# Patient Record
Sex: Male | Born: 1970
Health system: Southern US, Community
[De-identification: ages and names within clinical notes are randomized; demographics above are authoritative.]

## PROBLEM LIST (undated history)

## (undated) ENCOUNTER — Emergency Department (HOSPITAL_COMMUNITY): Admission: EM | Payer: Self-pay | Source: Home / Self Care

## (undated) DIAGNOSIS — W3400XA Accidental discharge from unspecified firearms or gun, initial encounter: Secondary | ICD-10-CM

## (undated) DIAGNOSIS — F1096 Alcohol use, unspecified with alcohol-induced persisting amnestic disorder: Secondary | ICD-10-CM

## (undated) DIAGNOSIS — I10 Essential (primary) hypertension: Secondary | ICD-10-CM

## (undated) DIAGNOSIS — Z9889 Other specified postprocedural states: Secondary | ICD-10-CM

## (undated) HISTORY — PX: OTHER SURGICAL HISTORY: SHX169

---

## 2006-05-02 ENCOUNTER — Encounter: Admission: RE | Admit: 2006-05-02 | Discharge: 2006-05-02 | Payer: Self-pay | Admitting: Family Medicine

## 2010-02-02 ENCOUNTER — Emergency Department (HOSPITAL_COMMUNITY): Admission: EM | Admit: 2010-02-02 | Discharge: 2010-02-02 | Payer: Self-pay | Admitting: Emergency Medicine

## 2010-02-08 ENCOUNTER — Emergency Department (HOSPITAL_COMMUNITY): Admission: EM | Admit: 2010-02-08 | Discharge: 2010-02-08 | Payer: Self-pay | Admitting: Family Medicine

## 2018-01-29 ENCOUNTER — Emergency Department (HOSPITAL_COMMUNITY)
Admission: EM | Admit: 2018-01-29 | Discharge: 2018-01-29 | Disposition: A | Discharge status: Home / Self Care | Payer: Self-pay | Attending: Emergency Medicine | Admitting: Emergency Medicine

## 2018-01-29 ENCOUNTER — Other Ambulatory Visit: Payer: Self-pay

## 2018-01-29 ENCOUNTER — Emergency Department (HOSPITAL_COMMUNITY): Payer: Self-pay

## 2018-01-29 ENCOUNTER — Encounter (HOSPITAL_COMMUNITY): Payer: Self-pay

## 2018-01-29 DIAGNOSIS — F17220 Nicotine dependence, chewing tobacco, uncomplicated: Secondary | ICD-10-CM | POA: Insufficient documentation

## 2018-01-29 DIAGNOSIS — S60352A Superficial foreign body of left thumb, initial encounter: Secondary | ICD-10-CM | POA: Insufficient documentation

## 2018-01-29 DIAGNOSIS — Y929 Unspecified place or not applicable: Secondary | ICD-10-CM | POA: Insufficient documentation

## 2018-01-29 DIAGNOSIS — Y9389 Activity, other specified: Secondary | ICD-10-CM | POA: Insufficient documentation

## 2018-01-29 DIAGNOSIS — Z23 Encounter for immunization: Secondary | ICD-10-CM | POA: Insufficient documentation

## 2018-01-29 DIAGNOSIS — Y998 Other external cause status: Secondary | ICD-10-CM | POA: Insufficient documentation

## 2018-01-29 DIAGNOSIS — W450XXA Nail entering through skin, initial encounter: Secondary | ICD-10-CM | POA: Insufficient documentation

## 2018-01-29 MED ORDER — TETANUS-DIPHTH-ACELL PERTUSSIS 5-2.5-18.5 LF-MCG/0.5 IM SUSP
0.5000 mL | Freq: Once | INTRAMUSCULAR | Status: AC
  Administered 2018-01-29: 0.5 mL via INTRAMUSCULAR
  Filled 2018-01-29: qty 0.5

## 2018-01-29 MED ORDER — LIDOCAINE HCL 2 % IJ SOLN
20.0000 mL | Freq: Once | INTRAMUSCULAR | Status: AC
  Administered 2018-01-29: 400 mg
  Filled 2018-01-29: qty 20

## 2018-01-29 NOTE — ED Notes (Signed)
PA at bedside.

## 2018-01-29 NOTE — Discharge Instructions (Signed)
You were seen in the emergency department for a foreign body embedded in your left thumb.  This was successfully removed.  You declined post removal x-ray however I do believe that the entire foreign body was successfully removed.  There is no indication for antibiotics at this time.  Keep the wound clean with clean water and soap, you may apply thin layer of antibiotic ointment to prevent infection.  Monitor for signs of infection including increased pain, redness, warmth, pus, pain with movement of the joints, fevers.

## 2018-01-29 NOTE — ED Provider Notes (Addendum)
[facility] EMERGENCY DEPARTMENT Provider Note   CSN: [id number] Arrival date & time: 01/29/18  1547     History   Chief Complaint No chief complaint on file.   HPI Dylan Snyder is a 47 y.o. male here for evaluation of foreign body in left thumb.  Onset a couple hours before arrival.  States he was using a nail gun and accidentally shot himself in the thumb.  He washed it with iodine and made a tiny incision to try to get it out himself but due to pain could not.  States that he is had this happen to him multiple times in the past and usually can remove them himself.  He denies distal tingling or numbness.  He has minimal pain.  No other associated symptoms.  Tetanus is not up-to-date.  States he was not wearing a glove, his hands were moderately dirty but there was no soil on his hands.  No h/o diabetes or other medical issues.   HPI  History reviewed. No pertinent past medical history.  There are no active problems to display for this patient.   History reviewed. No pertinent surgical history.      Home Medications    Prior to Admission medications   Not on File    Family History History reviewed. No pertinent family history.  Social History Social History   Tobacco Use  . Smoking status: Former Games developer  . Smokeless tobacco: Current User    Types: Snuff  Substance Use Topics  . Alcohol use: Not on file  . Drug use: Not on file     Allergies   Patient has no known allergies.   Review of Systems Review of Systems  Skin: Positive for wound.  All other systems reviewed and are negative.    Physical Exam Updated Vital Signs BP (!) 166/107 (BP Location: Right Arm)   Pulse 88   Temp 98.3 F (36.8 C) (Oral)   Resp 16   Ht 6\' 2"  (1.88 m)   Wt 96.6 kg (213 lb)   SpO2 100%   BMI 27.35 kg/m   Physical Exam  Constitutional: He is oriented to person, place, and time. He appears well-developed and well-nourished.  Non-toxic appearance.    HENT:  Head: Normocephalic.  Right Ear: External ear normal.  Left Ear: External ear normal.  Nose: Nose normal.  Eyes: Conjunctivae and EOM are normal.  Neck: Full passive range of motion without pain.  Cardiovascular: Normal rate.  Good cap refill to the distal left thumb.  Pulmonary/Chest: Effort normal. No tachypnea. No respiratory distress.  Musculoskeletal: Normal range of motion.  No focal bony tenderness to the joints of the left thumb.  Slightly decreased range of motion at the IP joint.  Neurological: He is alert and oriented to person, place, and time.  Sensation to light touch intact to the tip of the left thumb.  Skin: Skin is warm and dry. Capillary refill takes less than 2 seconds.  2 small abrasions to the palmar aspect of the left thumb across the IP joint, or patient used scissors to make incisions and attempts to remove the nail.  Minimal tenderness to this area.  No surrounding erythema, edema, warmth, drainage, debris.  Psychiatric: His behavior is normal. Thought content normal.     ED Treatments / Results  Labs (all labs ordered are listed, but only abnormal results are displayed) Labs Reviewed - No data to display  EKG None  Radiology Dg Finger Thumb  Left  Result Date: 01/29/2018 CLINICAL DATA:  Pin nail in thumb. EXAM: LEFT THUMB 2+V COMPARISON:  None. FINDINGS: Thin nail within the volar and ulnar soft tissues of the thumb, with the distal tip of the nail approximately 2 mm from the volar skin surface and 3 mm from the ulnar skin surface. No acute fracture or dislocation. Joint spaces are preserved. Bone mineralization is normal. IMPRESSION: Nail within the volar and ulnar soft tissues of the thumb as described above. No acute osseous abnormality. Electronically Signed   By: Obie Dredge M.D.   On: 01/29/2018 16:33    Procedures .Foreign Body Removal Date/Time: 02/13/2018 9:23 AM Performed by: Liberty Handy, PA-C Authorized by: Liberty Handy, PA-C  Consent: Verbal consent obtained. Written consent not obtained. Risks and benefits: risks, benefits and alternatives were discussed Consent given by: patient Patient understanding: patient states understanding of the procedure being performed Patient consent: the patient's understanding of the procedure matches consent given Relevant documents: relevant documents present and verified Imaging studies: imaging studies available Required items: required blood products, implants, devices, and special equipment available Patient identity confirmed: arm band Time out: Immediately prior to procedure a "time out" was called to verify the correct patient, procedure, equipment, support staff and site/side marked as required. Body area: skin General location: upper extremity Location details: left thumb Anesthesia: local infiltration  Anesthesia: Local Anesthetic: lidocaine 2% without epinephrine Anesthetic total: 3 mL  Sedation: Patient sedated: no  Patient restrained: no Patient cooperative: yes Localization method: visualized Removal mechanism: scalpel (suture removal tweezers) Dressing: antibiotic ointment Tendon involvement: none Depth: subcutaneous Complexity: complex 1 objects recovered. Foreign bodies recovered: long straight nail. Post-procedure assessment: foreign body removed Patient tolerance: Patient tolerated the procedure well with no immediate complications Comments: Patient declined post foreign body removal x-ray.  Nail removed matched nail he was using that he brought into ER for comparison. No palpable induration of FB remaining in skin after procedure. Pt able to flex/extend thumb without difficulty after procedure. Extremity with good cap refill.    (including critical care time)  Medications Ordered in ED Medications  lidocaine (XYLOCAINE) 2 % (with pres) injection 400 mg (400 mg Infiltration Given by Other 01/29/18 1653)  Tdap (BOOSTRIX) injection 0.5 mL  (0.5 mLs Intramuscular Given 01/29/18 1803)     Initial Impression / Assessment and Plan / ED Course  I have reviewed the triage vital signs and the nursing notes.  Pertinent labs & imaging results that were available during my care of the patient were reviewed by me and considered in my medical decision making (see chart for details).     X-ray confirms thin nail embedded in the volar soft tissue of the thumb.  This was completely removed in the ER.  I made a very tiny incision at distal thumb to facilitate removal, end of nail was visualized and palpated/removed with tweezers through incision. Patient's thumb was vascularly intact after procedure and he has full range of motion.  Recommended post procedure x-ray to confirm complete removal but states he will declined this at this time due to cost.  I do think that the whole foreign body was removed.  Tetanus was updated.  Used up-to-date to determine need for antibiotic prophylaxis for puncture wound, not indicated at this time as wound is not grossly contaminated, is small, there is no history of immunocompromise or diabetes.  Discussed plan to discharge with wound care instructions.  Patient is in agreement.  Final Clinical Impressions(s) /  ED Diagnoses   Final diagnoses:  Foreign body of left thumb, initial encounter    ED Discharge Orders    None       Jerrell MylarGibbons, Claudia J, PA-C 01/29/18 1806    Margarita Grizzleay, Danielle, MD 01/29/18 2056    Liberty HandyGibbons, Claudia J, PA-C 02/13/18 [id number]    Margarita Grizzleay, Danielle, MD 02/21/18 1028

## 2018-01-29 NOTE — ED Triage Notes (Signed)
Pt presents with pin nail embedded in L thumb x 1.5 hours ago.  Pt reports he attempted to pull it out but was unable to.

## 2018-01-29 NOTE — ED Provider Notes (Signed)
Patient placed in Quick Look pathway, seen and evaluated   Chief Complaint: nail to left thumb  HPI:  Dylan Snyder is a 47 y.o. male who presents to the ED with a foreign body to the left thumb.  Pt reports pin nail embedded in L thumb x 1.5 hours ago.  Pt reports he attempted to pull it out but was unable to.   ROS: Skin: f/b to left thumb  Physical Exam:  BP (!) 162/110 (BP Location: Right Arm)   Pulse (!) 108   Temp 98.3 F (36.8 C) (Oral)   Resp 16   Ht 6\' 2"  (1.88 m)   Wt 96.6 kg (213 lb)   SpO2 98%   BMI 27.35 kg/m    Gen: No distress  Neuro: Awake and Alert  Skin: puncture wound noted to the right thumb      Initiation of care has begun. The patient has been counseled on the process, plan, and necessity for staying for the completion/evaluation, and the remainder of the medical screening examination    Janne Napoleoneese, Hope M, NP 01/29/18 1603    Gerhard MunchLockwood, Robert, MD 01/29/18 [phone]

## 2018-11-24 ENCOUNTER — Encounter (HOSPITAL_COMMUNITY): Payer: Self-pay | Admitting: Emergency Medicine

## 2018-11-24 ENCOUNTER — Emergency Department (HOSPITAL_COMMUNITY)
Admission: EM | Admit: 2018-11-24 | Discharge: 2018-11-24 | Disposition: A | Discharge status: Home / Self Care | Payer: Self-pay | Attending: Emergency Medicine | Admitting: Emergency Medicine

## 2018-11-24 ENCOUNTER — Emergency Department (HOSPITAL_COMMUNITY): Payer: Self-pay

## 2018-11-24 ENCOUNTER — Other Ambulatory Visit: Payer: Self-pay

## 2018-11-24 DIAGNOSIS — R030 Elevated blood-pressure reading, without diagnosis of hypertension: Secondary | ICD-10-CM | POA: Insufficient documentation

## 2018-11-24 DIAGNOSIS — F17220 Nicotine dependence, chewing tobacco, uncomplicated: Secondary | ICD-10-CM | POA: Insufficient documentation

## 2018-11-24 DIAGNOSIS — R0789 Other chest pain: Secondary | ICD-10-CM | POA: Insufficient documentation

## 2018-11-24 HISTORY — DX: Essential (primary) hypertension: I10

## 2018-11-24 LAB — COMPREHENSIVE METABOLIC PANEL
ALT: 31 U/L (ref 0–44)
AST: 36 U/L (ref 15–41)
Albumin: 4.5 g/dL (ref 3.5–5.0)
Alkaline Phosphatase: 58 U/L (ref 38–126)
Anion gap: 10 (ref 5–15)
BUN: 10 mg/dL (ref 6–20)
CO2: 32 mmol/L (ref 22–32)
Calcium: 9.7 mg/dL (ref 8.9–10.3)
Chloride: 101 mmol/L (ref 98–111)
Creatinine, Ser: 0.85 mg/dL (ref 0.61–1.24)
GFR calc Af Amer: >60 mL/min (ref >60)
GFR calc non Af Amer: >60 mL/min (ref >60)
Glucose, Bld: 107 mg/dL — ABNORMAL HIGH (ref 70–99)
Potassium: 3.7 mmol/L (ref 3.5–5.1)
Sodium: 143 mmol/L (ref 135–145)
Total Bilirubin: 0.9 mg/dL (ref 0.3–1.2)
Total Protein: 8 g/dL (ref 6.5–8.1)

## 2018-11-24 LAB — I-STAT TROPONIN, ED: Troponin i, poc: 0 ng/mL (ref 0.00–0.08)

## 2018-11-24 LAB — CBC
HCT: 48.1 % (ref 39.0–52.0)
Hemoglobin: 16.8 g/dL (ref 13.0–17.0)
MCH: 34.1 pg — ABNORMAL HIGH (ref 26.0–34.0)
MCHC: 34.9 g/dL (ref 30.0–36.0)
MCV: 97.8 fL (ref 80.0–100.0)
Platelets: 312 10*3/uL (ref 150–400)
RBC: 4.92 MIL/uL (ref 4.22–5.81)
RDW: 12.7 % (ref 11.5–15.5)
WBC: 8.9 10*3/uL (ref 4.0–10.5)
nRBC: 0 % (ref 0.0–0.2)

## 2018-11-24 LAB — D-DIMER, QUANTITATIVE (NOT AT ARMC): D-Dimer, Quant: 0.39 ug/mL-FEU (ref 0.00–0.50)

## 2018-11-24 LAB — TSH: TSH: 1.689 u[IU]/mL (ref 0.350–4.500)

## 2018-11-24 LAB — T4, FREE: Free T4: 0.81 ng/dL — ABNORMAL LOW (ref 0.82–1.77)

## 2018-11-24 MED ORDER — LORAZEPAM 1 MG PO TABS
1.0000 mg | ORAL_TABLET | Freq: Once | ORAL | Status: DC
  Filled 2018-11-24: qty 1

## 2018-11-24 NOTE — Discharge Instructions (Addendum)
It was our pleasure to provide your ER care today - we hope that you feel better.  For chest discomfort, follow up with cardiologist in the coming week - call office to arrange appointment. Your blood pressure is high tonight - have it rechecked then.   For other symptoms, follow up with primary care doctor in the coming week.  Return to ER if worse, new symptoms, fevers, increased trouble breathing, persistent/recurrent chest pain, other concern.

## 2018-11-24 NOTE — ED Provider Notes (Signed)
[facility] EMERGENCY DEPARTMENT Provider Note   CSN: [id number] Arrival date & time:       History   Chief Complaint Chief Complaint  Patient presents with  . Chest Pain  . Shortness of Breath    HPI Dylan Snyder is a 48 y.o. male.     Patient c/o chest pain and mild sob since 2 days ago. States was trying to have sex with spouse when 'I couldn't perform', and felt tightness in chest. Symptoms acute onset, moderate, persistent, constant. Symptoms occur at rest, denies relation to activity or exertion. Not pleuritic. No hx similar symptoms. Denies leg pain or swelling. No recent immobility, trauma, travel or hx dvt or pe. Non smoker. Denies fam hx premature cad, states gp had cva. Denies heartburn or hx gerd. Hr in 110-120 range in ED, pt states 'my heart rate has always been fast'. Denies hx dysrhythmia. One caffeinated beverage in AM, denies other caffeine use or energy drink use, denies drug/stimulant use. Denies heat intolerance, fevers, sweats, or recent wt change. No hx thyroid disease. No recent meds/new meds.   The history is provided by the patient.  Chest Pain  Associated symptoms: shortness of breath   Associated symptoms: no abdominal pain, no back pain, no cough, no fever, no headache, no numbness, no palpitations, no vomiting and no weakness   Shortness of Breath  Associated symptoms: chest pain   Associated symptoms: no abdominal pain, no cough, no fever, no headaches, no neck pain, no rash, no sore throat and no vomiting     Past Medical History:  Diagnosis Date  . Hypertension     There are no active problems to display for this patient.   History reviewed. No pertinent surgical history.      Home Medications    Prior to Admission medications   Not on File    Family History History reviewed. No pertinent family history.  Social History Social History   Tobacco Use  . Smoking status: Former Games developer  . Smokeless tobacco:  Current User    Types: Snuff  Substance Use Topics  . Alcohol use: Yes  . Drug use: Never     Allergies   Patient has no known allergies.   Review of Systems Review of Systems  Constitutional: Negative for chills and fever.  HENT: Negative for sore throat.   Eyes: Negative for redness.  Respiratory: Positive for shortness of breath. Negative for cough.   Cardiovascular: Positive for chest pain. Negative for palpitations and leg swelling.  Gastrointestinal: Negative for abdominal pain, blood in stool, diarrhea and vomiting.  Endocrine: Negative for polyuria.  Genitourinary: Negative for dysuria and flank pain.  Musculoskeletal: Negative for back pain and neck pain.  Skin: Negative for rash.  Neurological: Negative for weakness, numbness and headaches.  Hematological: Does not bruise/bleed easily.  Psychiatric/Behavioral: Negative for confusion.     Physical Exam Updated Vital Signs BP (!) 159/115 (BP Location: Right Arm)   Pulse (!) 118   Temp 98 F (36.7 C) (Oral)   Resp 18   Ht 1.88 m (6\' 2" )   Wt 102.1 kg   SpO2 99%   BMI 28.89 kg/m   Physical Exam Vitals signs and nursing note reviewed.  Constitutional:      Appearance: Normal appearance. He is well-developed.  HENT:     Head: Atraumatic.     Nose: Nose normal.     Mouth/Throat:     Mouth: Mucous membranes are moist.  Pharynx: Oropharynx is clear.  Eyes:     General: No scleral icterus.    Conjunctiva/sclera: Conjunctivae normal.     Pupils: Pupils are equal, round, and reactive to light.  Neck:     Musculoskeletal: Normal range of motion and neck supple. No neck rigidity.     Trachea: No tracheal deviation.     Comments: No thyromegaly or tenderness.  Cardiovascular:     Rate and Rhythm: Regular rhythm. Tachycardia present.     Pulses: Normal pulses.     Heart sounds: Normal heart sounds. No murmur. No friction rub. No gallop.   Pulmonary:     Effort: Pulmonary effort is normal. No accessory  muscle usage or respiratory distress.     Breath sounds: Normal breath sounds.  Chest:     Chest wall: No tenderness.  Abdominal:     General: Bowel sounds are normal. There is no distension.     Palpations: Abdomen is soft. There is no mass.     Tenderness: There is no abdominal tenderness. There is no guarding or rebound.     Hernia: No hernia is present.  Genitourinary:    Comments: No cva tenderness. Musculoskeletal:        General: No swelling or tenderness.     Right lower leg: No edema.     Left lower leg: No edema.  Lymphadenopathy:     Cervical: No cervical adenopathy.  Skin:    General: Skin is warm and dry.     Findings: No rash.  Neurological:     Mental Status: He is alert.     Comments: Alert, speech clear. Motor/sens grossly intact bil. Steady gait.   Psychiatric:     Comments: Anxious appearing.       ED Treatments / Results  Labs (all labs ordered are listed, but only abnormal results are displayed) Results for orders placed or performed during the hospital encounter of 11/24/18  CBC  Result Value Ref Range   WBC 8.9 4.0 - 10.5 K/uL   RBC 4.92 4.22 - 5.81 MIL/uL   Hemoglobin 16.8 13.0 - 17.0 g/dL   HCT 16.1 09.6 - 04.5 %   MCV 97.8 80.0 - 100.0 fL   MCH 34.1 (H) 26.0 - 34.0 pg   MCHC 34.9 30.0 - 36.0 g/dL   RDW 40.9 81.1 - 91.4 %   Platelets 312 150 - 400 K/uL   nRBC 0.0 0.0 - 0.2 %  Comprehensive metabolic panel  Result Value Ref Range   Sodium 143 135 - 145 mmol/L   Potassium 3.7 3.5 - 5.1 mmol/L   Chloride 101 98 - 111 mmol/L   CO2 32 22 - 32 mmol/L   Glucose, Bld 107 (H) 70 - 99 mg/dL   BUN 10 6 - 20 mg/dL   Creatinine, Ser 7.82 0.61 - 1.24 mg/dL   Calcium 9.7 8.9 - 95.6 mg/dL   Total Protein 8.0 6.5 - 8.1 g/dL   Albumin 4.5 3.5 - 5.0 g/dL   AST 36 15 - 41 U/L   ALT 31 0 - 44 U/L   Alkaline Phosphatase 58 38 - 126 U/L   Total Bilirubin 0.9 0.3 - 1.2 mg/dL   GFR calc non Af Amer >60 >60 mL/min   GFR calc Af Amer >60 >60 mL/min   Anion  gap 10 5 - 15  D-dimer, quantitative (not at [facility])  Result Value Ref Range   D-Dimer, Quant 0.39 0.00 - 0.50 ug/mL-FEU  T4, free  Result  Value Ref Range   Free T4 0.81 (L) 0.82 - 1.77 ng/dL  TSH  Result Value Ref Range   TSH 1.689 0.350 - 4.500 uIU/mL  I-stat troponin, ED  Result Value Ref Range   Troponin i, poc 0.00 0.00 - 0.08 ng/mL   Comment 3           Dg Chest Port 1 View  Result Date: 11/24/2018 CLINICAL DATA:  Chest pain, short of breath EXAM: PORTABLE CHEST 1 VIEW COMPARISON:  05/02/2006 FINDINGS: The heart size and mediastinal contours are within normal limits. Both lungs are clear. The visualized skeletal structures are unremarkable. IMPRESSION: No active disease. Electronically Signed   By: Jasmine PangKim  Fujinaga M.D.   On: 11/24/2018 20:55    EKG EKG Interpretation  Date/Time:  Sunday November 24 2018 20:27:34 EDT Ventricular Rate:  122 PR Interval:    QRS Duration: 97 QT Interval:  316 QTC Calculation: 451 R Axis:   67 Text Interpretation:  Sinus tachycardia No previous tracing Confirmed by Cathren LaineSteinl, Lira Stephen ([id number]) on 11/24/2018 8:29:34 PM   Radiology Dg Chest Port 1 View  Result Date: 11/24/2018 CLINICAL DATA:  Chest pain, short of breath EXAM: PORTABLE CHEST 1 VIEW COMPARISON:  05/02/2006 FINDINGS: The heart size and mediastinal contours are within normal limits. Both lungs are clear. The visualized skeletal structures are unremarkable. IMPRESSION: No active disease. Electronically Signed   By: Jasmine PangKim  Fujinaga M.D.   On: 11/24/2018 20:55    Procedures Procedures (including critical care time)  Medications Ordered in ED Medications - No data to display   Initial Impression / Assessment and Plan / ED Course  I have reviewed the triage vital signs and the nursing notes.  Pertinent labs & imaging results that were available during my care of the patient were reviewed by me and considered in my medical decision making (see chart for details).  Iv ns. Ecg. Continuous pulse  ox and monitor. Labs and cxr.   Reviewed nursing notes and prior charts for additional history.   cxr reviewed by me - no pna.  Labs reviewed by me - after symptoms present, constant, x 2 days, trop is normal/0 and ddimer normal.   Recheck pt, resting quietly/soundly. Hr 94. rr 14. No sob or increased wob. Afebrile.   Patient currently appears stable for d/c.   rec close outpt pcp f/u. For recent chest discomfort, will also refer to close outpt cardiology f/u.  Return precautions provided.     Final Clinical Impressions(s) / ED Diagnoses   Final diagnoses:  None    ED Discharge Orders    None       Cathren LaineSteinl, Leahna Hewson, MD 11/24/18 2214

## 2018-11-24 NOTE — ED Triage Notes (Signed)
From home with c/o of chest discomfort and SOB. Pt states he was involved in sexual intercourse with his wife 2 days ago and "everything was great." The pt reports not being able to keep an erection with CP and SOB. Pt states he just feels off.   Denies COVID symptoms and reports being quarantined as ordered.

## 2018-11-24 NOTE — ED Notes (Signed)
Patient verbalizes understanding of discharge instructions. Opportunity for questioning and answers were provided. Armband removed by staff, pt discharged from ED ambulatory.   

## 2018-11-27 ENCOUNTER — Other Ambulatory Visit: Payer: Self-pay

## 2018-11-27 ENCOUNTER — Ambulatory Visit (INDEPENDENT_AMBULATORY_CARE_PROVIDER_SITE_OTHER): Payer: Self-pay | Admitting: Internal Medicine

## 2018-11-27 ENCOUNTER — Encounter: Payer: Self-pay | Admitting: Internal Medicine

## 2018-11-27 VITALS — BP 138/86 | HR 98 | Ht 74.0 in | Wt 212.6 lb

## 2018-11-27 DIAGNOSIS — I10 Essential (primary) hypertension: Secondary | ICD-10-CM

## 2018-11-27 DIAGNOSIS — R0789 Other chest pain: Secondary | ICD-10-CM

## 2018-11-27 DIAGNOSIS — R079 Chest pain, unspecified: Secondary | ICD-10-CM

## 2018-11-27 NOTE — Patient Instructions (Signed)
Medication Instructions:  Your physician recommends that you continue on your current medications as directed. Please refer to the Current Medication list given to you today.  No Energy drinks  No using tobacco products  Reduce your amount of alcohol intake  Reduce your salt intake  Labwork: You will get fasting lipid panel on the same day as your stress test.  Testing/Procedures: Your physician has requested that you have an exercise tolerance test. For further information please visit [email or web address] Please also follow instruction sheet, as given.   Follow-Up: Your physician wants you to follow-up in: 4-6 weeks for a virtual visit   Any Other Special Instructions Will Be Listed Below (If Applicable).  If you need a refill on your cardiac medications before your next appointment, please call your pharmacy.

## 2018-11-27 NOTE — Progress Notes (Signed)
Electrophysiology Office Note   Date:  11/27/2018   ID:  Dylan Snyder, DOB Jul 30, 1971, MRN [id number]  PCP:  Patient, No Pcp Per  Primary Electrophysiologist: Hillis RangeJames Ranier Coach, MD    CC: CP   History of Present Illness: Dylan Snyder is a 48 y.o. male who presents today for cardiology evaluation.   [facility]/24/2018 for further evaluation of chest pain.  He reports that while having sex with his wife this past Friday, he developed symptoms of chest tightness with SOB.  Though his symptoms lasted only several minutes, he reports that he has had substantial anxiety since that time.  He has not noticed any additional chest pain.  He has had anxiety and sinus tachycardia and is fearful that he may have "Something wrong with (his) heart".  Today, he denies symptoms of palpitations, chest pain, shortness of breath, orthopnea, PND, lower extremity edema, claudication, dizziness, presyncope, syncope, bleeding, or neurologic sequela. The patient is tolerating medications without difficulties and is otherwise without complaint today.    Past Medical History:  Diagnosis Date  . Hypertension    Past Surgical History:  Procedure Laterality Date  . none      No current outpatient medications on file.   No current facility-administered medications for this visit.     Allergies:   Patient has no known allergies.   Social History:  The patient  reports that he has quit smoking. His smokeless tobacco use includes snuff. He reports current alcohol use. He reports that he does not use drugs.   Family History:  The patient has had family members with CAD at advanced ages, no early onset CAD>   ROS:  Please see the history of present illness.   All other systems are personally reviewed and negative.    PHYSICAL EXAM: VS:  BP 138/86   Pulse 98   Ht 6\' 2"  (1.88 m)   Wt 212 lb 9.6 oz (96.4 kg)   SpO2 99%   BMI 27.30 kg/m  , BMI Body mass index is 27.3 kg/m. GEN: Well  nourished, well developed, in no acute distress  HEENT: normal  Neck: no JVD, carotid bruits, or masses Cardiac: RRR; no murmurs, rubs, or gallops,no edema  Respiratory:  clear to auscultation bilaterally, normal work of breathing GI: soft, nontender, nondistended, + BS MS: no deformity or atrophy  Skin: warm and dry  Neuro:  Strength and sensation are intact Psych: euthymic mood, full affect  EKG:  EKG is ordered today The ekg ordered today is personally reviewed and shows sinus rhythm   Recent Labs: 11/24/2018: ALT 31; BUN 10; Creatinine, Ser 0.85; Hemoglobin 16.8; Platelets 312; Potassium 3.7; Sodium 143; TSH 1.689  personally reviewed   Lipid Panel  No results found for: CHOL, TRIG, HDL, CHOLHDL, VLDL, LDLCALC, LDLDIRECT personally reviewed   Wt Readings from Last 3 Encounters:  11/27/18 212 lb 9.6 oz (96.4 kg)  11/24/18 225 lb (102.1 kg)  01/29/18 213 lb (96.6 kg)     Other studies personally reviewed: Additional studies/ records that were reviewed today include: Dr Gust BroomsSteinls notes, recent ekg and labs  Review of the above records today demonstrates: as above   ASSESSMENT AND PLAN:  1.  Atypical chest pain Likely due to anxiety We discussed importance of avoidance of monster drinks and other high caffeinated beverages.  He denies exertional symptoms and continues to work in [employer]. I do think that given h/o tobacco and elevated BP that further CV  risk stratification is advised.  I would advise exercise treadmill at the next available time.  Even with COVID 19 distancing, I think that this should be performed in the next week or so. Check fasting lipids at time of ETT  2. ETOH Reduction is advised (he drinks a 12 pack per day)  3. HTN Elevated BP noted Lifestyle modification is discussed at length including sodium restriction, caffeine reduction and ETOh reduction. He may eventually require medical therapy I have advised that he check his BP regularly at home.   Follow-up:  With me with virtual visit in 4 weeks  Current medicines are reviewed at length with the patient today.   The patient does not have concerns regarding his medicines.  The following changes were made today:  none  Labs/ tests ordered today include:  Orders Placed This Encounter  Procedures  . Lipid Profile  . Exercise Tolerance Test  . EKG 12-Lead    Signed, Hillis Range, MD  11/27/2018 10:03 PM     [facility] [address] [place] [address] [phone] (office) [phone] (fax)

## 2019-04-25 ENCOUNTER — Emergency Department (HOSPITAL_COMMUNITY): Payer: Medicaid Other | Admitting: Anesthesiology

## 2019-04-25 ENCOUNTER — Encounter (HOSPITAL_COMMUNITY): Admission: EM | Disposition: A | Discharge status: Home Health | Payer: Self-pay | Source: Home / Self Care

## 2019-04-25 ENCOUNTER — Encounter (HOSPITAL_COMMUNITY): Payer: Self-pay | Admitting: Emergency Medicine

## 2019-04-25 ENCOUNTER — Emergency Department (HOSPITAL_COMMUNITY): Payer: Medicaid Other

## 2019-04-25 ENCOUNTER — Inpatient Hospital Stay (HOSPITAL_COMMUNITY)
Admission: EM | Admit: 2019-04-25 | Discharge: 2019-05-08 | DRG: 957 | Disposition: A | Discharge status: Home Health | Payer: Medicaid Other | Attending: General Surgery | Admitting: General Surgery

## 2019-04-25 ENCOUNTER — Other Ambulatory Visit: Payer: Self-pay

## 2019-04-25 DIAGNOSIS — F10239 Alcohol dependence with withdrawal, unspecified: Secondary | ICD-10-CM | POA: Diagnosis not present

## 2019-04-25 DIAGNOSIS — L7632 Postprocedural hematoma of skin and subcutaneous tissue following other procedure: Secondary | ICD-10-CM | POA: Diagnosis not present

## 2019-04-25 DIAGNOSIS — S31823A Puncture wound without foreign body of left buttock, initial encounter: Secondary | ICD-10-CM | POA: Diagnosis present

## 2019-04-25 DIAGNOSIS — S71131A Puncture wound without foreign body, right thigh, initial encounter: Secondary | ICD-10-CM | POA: Diagnosis present

## 2019-04-25 DIAGNOSIS — T07XXXA Unspecified multiple injuries, initial encounter: Secondary | ICD-10-CM

## 2019-04-25 DIAGNOSIS — S3669XA Other injury of rectum, initial encounter: Secondary | ICD-10-CM | POA: Diagnosis present

## 2019-04-25 DIAGNOSIS — W3400XA Accidental discharge from unspecified firearms or gun, initial encounter: Secondary | ICD-10-CM

## 2019-04-25 DIAGNOSIS — S71132A Puncture wound without foreign body, left thigh, initial encounter: Secondary | ICD-10-CM | POA: Diagnosis present

## 2019-04-25 DIAGNOSIS — J9601 Acute respiratory failure with hypoxia: Secondary | ICD-10-CM | POA: Diagnosis not present

## 2019-04-25 DIAGNOSIS — Z933 Colostomy status: Secondary | ICD-10-CM

## 2019-04-25 DIAGNOSIS — Z20828 Contact with and (suspected) exposure to other viral communicable diseases: Secondary | ICD-10-CM | POA: Diagnosis present

## 2019-04-25 DIAGNOSIS — K117 Disturbances of salivary secretion: Secondary | ICD-10-CM

## 2019-04-25 DIAGNOSIS — Z9911 Dependence on respirator [ventilator] status: Secondary | ICD-10-CM

## 2019-04-25 DIAGNOSIS — E876 Hypokalemia: Secondary | ICD-10-CM | POA: Diagnosis present

## 2019-04-25 DIAGNOSIS — D62 Acute posthemorrhagic anemia: Secondary | ICD-10-CM | POA: Diagnosis not present

## 2019-04-25 DIAGNOSIS — S3739XA Other injury of urethra, initial encounter: Principal | ICD-10-CM | POA: Diagnosis present

## 2019-04-25 DIAGNOSIS — S3660XA Unspecified injury of rectum, initial encounter: Secondary | ICD-10-CM

## 2019-04-25 DIAGNOSIS — Z720 Tobacco use: Secondary | ICD-10-CM

## 2019-04-25 DIAGNOSIS — S37828A Other injury of prostate, initial encounter: Secondary | ICD-10-CM | POA: Diagnosis present

## 2019-04-25 DIAGNOSIS — S3124XA Puncture wound with foreign body of penis, initial encounter: Secondary | ICD-10-CM | POA: Diagnosis present

## 2019-04-25 DIAGNOSIS — K644 Residual hemorrhoidal skin tags: Secondary | ICD-10-CM | POA: Diagnosis present

## 2019-04-25 DIAGNOSIS — T8149XA Infection following a procedure, other surgical site, initial encounter: Secondary | ICD-10-CM | POA: Diagnosis not present

## 2019-04-25 DIAGNOSIS — S32501B Unspecified fracture of right pubis, initial encounter for open fracture: Secondary | ICD-10-CM | POA: Diagnosis present

## 2019-04-25 DIAGNOSIS — Z9359 Other cystostomy status: Secondary | ICD-10-CM

## 2019-04-25 DIAGNOSIS — S3730XA Unspecified injury of urethra, initial encounter: Secondary | ICD-10-CM

## 2019-04-25 DIAGNOSIS — I10 Essential (primary) hypertension: Secondary | ICD-10-CM | POA: Diagnosis present

## 2019-04-25 DIAGNOSIS — Z23 Encounter for immunization: Secondary | ICD-10-CM

## 2019-04-25 DIAGNOSIS — R509 Fever, unspecified: Secondary | ICD-10-CM

## 2019-04-25 HISTORY — PX: COLOSTOMY: SHX63

## 2019-04-25 HISTORY — PX: CYSTOSCOPY: SHX5120

## 2019-04-25 HISTORY — PX: LAPAROTOMY: SHX154

## 2019-04-25 LAB — CBC WITH DIFFERENTIAL/PLATELET
Abs Immature Granulocytes: 0.02 10*3/uL (ref 0.00–0.07)
Basophils Absolute: 0.1 10*3/uL (ref 0.0–0.1)
Basophils Relative: 1 %
Eosinophils Absolute: 0.1 10*3/uL (ref 0.0–0.5)
Eosinophils Relative: 1 %
HCT: 42.6 % (ref 39.0–52.0)
Hemoglobin: 15 g/dL (ref 13.0–17.0)
Immature Granulocytes: 0 %
Lymphocytes Relative: 59 %
Lymphs Abs: 4.3 10*3/uL — ABNORMAL HIGH (ref 0.7–4.0)
MCH: 36.6 pg — ABNORMAL HIGH (ref 26.0–34.0)
MCHC: 35.2 g/dL (ref 30.0–36.0)
MCV: 103.9 fL — ABNORMAL HIGH (ref 80.0–100.0)
Monocytes Absolute: 0.5 10*3/uL (ref 0.1–1.0)
Monocytes Relative: 7 %
Neutro Abs: 2.3 10*3/uL (ref 1.7–7.7)
Neutrophils Relative %: 32 %
Platelets: 259 10*3/uL (ref 150–400)
RBC: 4.1 MIL/uL — ABNORMAL LOW (ref 4.22–5.81)
RDW: 12.7 % (ref 11.5–15.5)
WBC: 7.3 10*3/uL (ref 4.0–10.5)
nRBC: 0 % (ref 0.0–0.2)

## 2019-04-25 LAB — COMPREHENSIVE METABOLIC PANEL
ALT: 51 U/L — ABNORMAL HIGH (ref 0–44)
AST: 100 U/L — ABNORMAL HIGH (ref 15–41)
Albumin: 4 g/dL (ref 3.5–5.0)
Alkaline Phosphatase: 130 U/L — ABNORMAL HIGH (ref 38–126)
Anion gap: 15 (ref 5–15)
BUN: 7 mg/dL (ref 6–20)
CO2: 19 mmol/L — ABNORMAL LOW (ref 22–32)
Calcium: 9 mg/dL (ref 8.9–10.3)
Chloride: 106 mmol/L (ref 98–111)
Creatinine, Ser: 0.89 mg/dL (ref 0.61–1.24)
GFR calc Af Amer: >60 mL/min (ref >60)
GFR calc non Af Amer: >60 mL/min (ref >60)
Glucose, Bld: 132 mg/dL — ABNORMAL HIGH (ref 70–99)
Potassium: 3.6 mmol/L (ref 3.5–5.1)
Sodium: 140 mmol/L (ref 135–145)
Total Bilirubin: 0.8 mg/dL (ref 0.3–1.2)
Total Protein: 6.9 g/dL (ref 6.5–8.1)

## 2019-04-25 LAB — PROTIME-INR
INR: 1 (ref 0.8–1.2)
Prothrombin Time: 13.2 seconds (ref 11.4–15.2)

## 2019-04-25 LAB — SARS CORONAVIRUS 2 BY RT PCR (HOSPITAL ORDER, PERFORMED IN ~~LOC~~ HOSPITAL LAB): SARS Coronavirus 2: NEGATIVE

## 2019-04-25 LAB — ABO/RH: ABO/RH(D): O POS

## 2019-04-25 SURGERY — LAPAROTOMY, EXPLORATORY
Anesthesia: General | Site: Urethra

## 2019-04-25 MED ORDER — METRONIDAZOLE IN NACL 5-0.79 MG/ML-% IV SOLN
500.0000 mg | Freq: Once | INTRAVENOUS | Status: AC
  Administered 2019-04-25: 500 mg via INTRAVENOUS
  Filled 2019-04-25: qty 100

## 2019-04-25 MED ORDER — MIDAZOLAM HCL 2 MG/2ML IJ SOLN
INTRAMUSCULAR | Status: AC
  Filled 2019-04-25: qty 2

## 2019-04-25 MED ORDER — SODIUM CHLORIDE 0.9 % IR SOLN
Status: DC | PRN
  Administered 2019-04-25: 3000 mL via INTRAVESICAL

## 2019-04-25 MED ORDER — SUCCINYLCHOLINE CHLORIDE 200 MG/10ML IV SOSY
PREFILLED_SYRINGE | INTRAVENOUS | Status: AC
  Filled 2019-04-25: qty 10

## 2019-04-25 MED ORDER — FENTANYL CITRATE (PF) 100 MCG/2ML IJ SOLN
INTRAMUSCULAR | Status: AC
  Filled 2019-04-25: qty 4

## 2019-04-25 MED ORDER — PROPOFOL 1000 MG/100ML IV EMUL
INTRAVENOUS | Status: AC
  Filled 2019-04-25: qty 100

## 2019-04-25 MED ORDER — ROCURONIUM BROMIDE 100 MG/10ML IV SOLN
INTRAVENOUS | Status: DC | PRN
  Administered 2019-04-25: 100 mg via INTRAVENOUS
  Administered 2019-04-25 (×2): 50 mg via INTRAVENOUS

## 2019-04-25 MED ORDER — LACTATED RINGERS IV SOLN
INTRAVENOUS | Status: DC | PRN
  Administered 2019-04-25 (×2): via INTRAVENOUS

## 2019-04-25 MED ORDER — LACTATED RINGERS IV SOLN
INTRAVENOUS | Status: DC | PRN
  Administered 2019-04-25 (×2): via INTRAVENOUS

## 2019-04-25 MED ORDER — ONDANSETRON HCL 4 MG/2ML IJ SOLN: 4.0000 mg | Freq: Four times a day (QID) | INTRAMUSCULAR | Status: DC | PRN

## 2019-04-25 MED ORDER — CEFAZOLIN SODIUM-DEXTROSE 2-3 GM-%(50ML) IV SOLR
INTRAVENOUS | Status: DC | PRN
  Administered 2019-04-25: 2 g via INTRAVENOUS

## 2019-04-25 MED ORDER — SODIUM CHLORIDE 0.9 % IV SOLN: INTRAVENOUS | Status: DC

## 2019-04-25 MED ORDER — PHENYLEPHRINE HCL (PRESSORS) 10 MG/ML IV SOLN
INTRAVENOUS | Status: DC | PRN
  Administered 2019-04-25: 80 ug via INTRAVENOUS

## 2019-04-25 MED ORDER — PHENYLEPHRINE 40 MCG/ML (10ML) SYRINGE FOR IV PUSH (FOR BLOOD PRESSURE SUPPORT)
PREFILLED_SYRINGE | INTRAVENOUS | Status: AC
  Filled 2019-04-25: qty 20

## 2019-04-25 MED ORDER — ROCURONIUM BROMIDE 10 MG/ML (PF) SYRINGE
PREFILLED_SYRINGE | INTRAVENOUS | Status: AC
  Filled 2019-04-25: qty 20

## 2019-04-25 MED ORDER — PROPOFOL 10 MG/ML IV BOLUS
INTRAVENOUS | Status: DC | PRN
  Administered 2019-04-25: 180 mg via INTRAVENOUS

## 2019-04-25 MED ORDER — OXYCODONE HCL 5 MG PO TABS: 5.0000 mg | ORAL_TABLET | Freq: Once | ORAL | Status: DC | PRN

## 2019-04-25 MED ORDER — ROCURONIUM BROMIDE 10 MG/ML (PF) SYRINGE
PREFILLED_SYRINGE | INTRAVENOUS | Status: AC
  Filled 2019-04-25: qty 10

## 2019-04-25 MED ORDER — FENTANYL CITRATE (PF) 250 MCG/5ML IJ SOLN
INTRAMUSCULAR | Status: AC
  Filled 2019-04-25: qty 5

## 2019-04-25 MED ORDER — 0.9 % SODIUM CHLORIDE (POUR BTL) OPTIME
TOPICAL | Status: DC | PRN
  Administered 2019-04-25: 2000 mL

## 2019-04-25 MED ORDER — OXYCODONE HCL 5 MG/5ML PO SOLN: 5.0000 mg | Freq: Once | ORAL | Status: DC | PRN

## 2019-04-25 MED ORDER — PROPOFOL 10 MG/ML IV BOLUS
INTRAVENOUS | Status: AC
  Filled 2019-04-25: qty 20

## 2019-04-25 MED ORDER — ALBUMIN HUMAN 5 % IV SOLN
INTRAVENOUS | Status: DC | PRN
  Administered 2019-04-25 (×2): via INTRAVENOUS

## 2019-04-25 MED ORDER — MIDAZOLAM HCL 5 MG/5ML IJ SOLN
INTRAMUSCULAR | Status: DC | PRN
  Administered 2019-04-25: 2 mg via INTRAVENOUS

## 2019-04-25 MED ORDER — SUCCINYLCHOLINE CHLORIDE 20 MG/ML IJ SOLN
INTRAMUSCULAR | Status: DC | PRN
  Administered 2019-04-25: 140 mg via INTRAVENOUS

## 2019-04-25 MED ORDER — LIDOCAINE 2% (20 MG/ML) 5 ML SYRINGE
INTRAMUSCULAR | Status: AC
  Filled 2019-04-25: qty 5

## 2019-04-25 MED ORDER — FENTANYL CITRATE (PF) 100 MCG/2ML IJ SOLN
100.0000 ug | Freq: Once | INTRAMUSCULAR | Status: AC
  Administered 2019-04-25: 100 ug via INTRAVENOUS
  Administered 2019-04-25: 50 ug via INTRAVENOUS
  Administered 2019-04-25: 100 ug via INTRAVENOUS
  Administered 2019-04-25: 50 ug via INTRAVENOUS
  Administered 2019-04-25 (×2): 100 ug via INTRAVENOUS

## 2019-04-25 MED ORDER — EPHEDRINE 5 MG/ML INJ
INTRAVENOUS | Status: AC
  Filled 2019-04-25: qty 10

## 2019-04-25 MED ORDER — FENTANYL CITRATE (PF) 100 MCG/2ML IJ SOLN: 25.0000 ug | INTRAMUSCULAR | Status: DC | PRN

## 2019-04-25 MED ORDER — LIDOCAINE HCL (CARDIAC) PF 100 MG/5ML IV SOSY
PREFILLED_SYRINGE | INTRAVENOUS | Status: DC | PRN
  Administered 2019-04-25: 40 mg via INTRAVENOUS

## 2019-04-25 SURGICAL SUPPLY — 58 items
APL PRP STRL LF DISP 70% ISPRP (MISCELLANEOUS) ×3
BLADE CLIPPER SURG (BLADE) IMPLANT
CANISTER SUCT 3000ML PPV (MISCELLANEOUS) ×5 IMPLANT
CATH COUDE 22FR 5CC (CATHETERS) IMPLANT
CATH FOLEY 2W COUNCIL 20FR 5CC (CATHETERS) ×2 IMPLANT
CATH FOLEY 2WAY SLVR  5CC 20FR (CATHETERS) ×2
CATH FOLEY 2WAY SLVR 5CC 20FR (CATHETERS) IMPLANT
CATH RIBBED COUDE 30CC (CATHETERS) ×10
CATH ROBINSON RED A/P 16FR (CATHETERS) ×2 IMPLANT
CHLORAPREP W/TINT 26 (MISCELLANEOUS) ×5 IMPLANT
COVER SURGICAL LIGHT HANDLE (MISCELLANEOUS) ×5 IMPLANT
COVER WAND RF STERILE (DRAPES) ×5 IMPLANT
DRAIN CHANNEL 19F RND (DRAIN) ×2 IMPLANT
DRAPE LAPAROSCOPIC ABDOMINAL (DRAPES) ×5 IMPLANT
DRAPE WARM FLUID 44X44 (DRAPES) ×5 IMPLANT
DRSG OPSITE POSTOP 4X10 (GAUZE/BANDAGES/DRESSINGS) ×2 IMPLANT
DRSG OPSITE POSTOP 4X12 (GAUZE/BANDAGES/DRESSINGS) ×2 IMPLANT
DRSG OPSITE POSTOP 4X8 (GAUZE/BANDAGES/DRESSINGS) IMPLANT
DRSG TEGADERM 4X4.75 (GAUZE/BANDAGES/DRESSINGS) ×4 IMPLANT
ELECT BLADE 6.5 EXT (BLADE) IMPLANT
ELECT CAUTERY BLADE 6.4 (BLADE) ×10 IMPLANT
ELECT REM PT RETURN 9FT ADLT (ELECTROSURGICAL) ×5
ELECTRODE REM PT RTRN 9FT ADLT (ELECTROSURGICAL) ×3 IMPLANT
EVACUATOR SILICONE 100CC (DRAIN) ×2 IMPLANT
GAUZE SPONGE 4X4 12PLY STRL (GAUZE/BANDAGES/DRESSINGS) ×2 IMPLANT
GLOVE BIO SURGEON STRL SZ7 (GLOVE) ×7 IMPLANT
GLOVE BIOGEL PI IND STRL 7.5 (GLOVE) ×3 IMPLANT
GLOVE BIOGEL PI INDICATOR 7.5 (GLOVE) ×2
GOWN STRL REUS W/ TWL LRG LVL3 (GOWN DISPOSABLE) ×6 IMPLANT
GOWN STRL REUS W/TWL LRG LVL3 (GOWN DISPOSABLE) ×10
GUIDEWIRE STR DUAL SENSOR (WIRE) ×2 IMPLANT
HANDLE SUCTION POOLE (INSTRUMENTS) ×3 IMPLANT
KIT BASIN OR (CUSTOM PROCEDURE TRAY) ×5 IMPLANT
KIT TURNOVER KIT B (KITS) ×5 IMPLANT
LIGASURE IMPACT 36 18CM CVD LR (INSTRUMENTS) IMPLANT
NS IRRIG 1000ML POUR BTL (IV SOLUTION) ×10 IMPLANT
PACK GENERAL/GYN (CUSTOM PROCEDURE TRAY) ×5 IMPLANT
PAD ARMBOARD 7.5X6 YLW CONV (MISCELLANEOUS) ×5 IMPLANT
PENCIL SMOKE EVACUATOR (MISCELLANEOUS) ×5 IMPLANT
SET IRRIG Y TYPE TUR BLADDER L (SET/KITS/TRAYS/PACK) ×2 IMPLANT
SPECIMEN JAR LARGE (MISCELLANEOUS) IMPLANT
SPONGE LAP 18X18 RF (DISPOSABLE) ×12 IMPLANT
STAPLER VISISTAT 35W (STAPLE) ×7 IMPLANT
SUCTION POOLE HANDLE (INSTRUMENTS) ×5
SUT ETHILON 2 0 FS 18 (SUTURE) ×2 IMPLANT
SUT PDS AB 1 TP1 96 (SUTURE) ×10 IMPLANT
SUT SILK 2 0 SH CR/8 (SUTURE) ×5 IMPLANT
SUT SILK 2 0 TIES 10X30 (SUTURE) ×5 IMPLANT
SUT SILK 3 0 SH CR/8 (SUTURE) ×5 IMPLANT
SUT SILK 3 0 TIES 10X30 (SUTURE) ×5 IMPLANT
SUT VIC AB 2-0 UR5 27 (SUTURE) ×4 IMPLANT
SUT VIC AB 3-0 SH 18 (SUTURE) ×2 IMPLANT
SUT VIC AB 3-0 SH 8-18 (SUTURE) ×4 IMPLANT
SYR 10ML LL (SYRINGE) ×2 IMPLANT
TAPE CLOTH SURG 4X10 WHT LF (GAUZE/BANDAGES/DRESSINGS) ×2 IMPLANT
TOWEL GREEN STERILE (TOWEL DISPOSABLE) ×5 IMPLANT
TRAY FOLEY MTR SLVR 16FR STAT (SET/KITS/TRAYS/PACK) IMPLANT
YANKAUER SUCT BULB TIP NO VENT (SUCTIONS) IMPLANT

## 2019-04-25 NOTE — Anesthesia Procedure Notes (Signed)
Arterial Line Insertion Start/End9/25/2020 9:45 PM, 04/25/2019 10:00 PM Performed by: Oletta Lamas, CRNA, CRNA  Patient location: OR. Preanesthetic checklist: patient identified, IV checked, risks and benefits discussed, monitors and equipment checked and pre-op evaluation radial was placed Catheter size: 20 G Maximum sterile barriers used   Attempts: 1 Procedure performed without using ultrasound guided technique. Ultrasound Notes:anatomy identified, needle tip was noted to be adjacent to the nerve/plexus identified and no ultrasound evidence of intravascular and/or intraneural injection Following insertion, dressing applied and Biopatch. Patient tolerated the procedure well with no immediate complications.
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
## 2019-04-25 NOTE — Anesthesia Procedure Notes (Signed)
Procedure Name: Intubation Date/Time: 04/25/2019 9:33 PM Performed by: Oletta Lamas, CRNA Pre-anesthesia Checklist: Patient identified, Emergency Drugs available, Suction available and Patient being monitored Patient Re-evaluated:Patient Re-evaluated prior to induction Oxygen Delivery Method: Circle System Utilized Preoxygenation: Pre-oxygenation with 100% oxygen Induction Type: IV induction, Rapid sequence and Cricoid Pressure applied Laryngoscope Size: Miller and 3 Grade View: Grade I Tube type: Oral Tube size: 8.0 mm Number of attempts: 1 Airway Equipment and Method: Stylet Placement Confirmation: ETT inserted through vocal cords under direct vision,  positive ETCO2 and breath sounds checked- equal and bilateral Secured at: 23 cm Tube secured with: Tape Dental Injury: Teeth and Oropharynx as per pre-operative assessment
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
## 2019-04-25 NOTE — Progress Notes (Signed)
TRN note- Pt's wife Arville Postlewaite [phone] currently in Consult B in ED waiting.  Family updated that patient is currently in OR and they will be updated on status. [employer] with family.  Malachi Paradise [phone]

## 2019-04-25 NOTE — Anesthesia Preprocedure Evaluation (Addendum)
Anesthesia Evaluation  Patient identified by MRN, date of birth, ID band Patient awake    Reviewed: Allergy & Precautions, H&P , NPO status , Patient's Chart, lab work & pertinent test resultsPreop documentation limited or incomplete due to emergent nature of procedure.  Airway Mallampati: II   Neck ROM: full    Dental  (+) Dental Advisory Given, Teeth Intact   Pulmonary former smoker,    breath sounds clear to auscultation       Cardiovascular hypertension,  Rhythm:regular Rate:Normal     Neuro/Psych    GI/Hepatic   Endo/Other    Renal/GU      Musculoskeletal   Abdominal   Peds  Hematology   Anesthesia Other Findings   Reproductive/Obstetrics                           Anesthesia Physical Anesthesia Plan  ASA: II and emergent  Anesthesia Plan: General   Post-op Pain Management:    Induction: Intravenous, Rapid sequence and Cricoid pressure planned  PONV Risk Score and Plan: 2 and Ondansetron, Dexamethasone, Midazolam and Treatment may vary due to age or medical condition  Airway Management Planned: Oral ETT  Additional Equipment: Arterial line  Intra-op Plan:   Post-operative Plan: Extubation in OR  Informed Consent: I have reviewed the patients History and Physical, chart, labs and discussed the procedure including the risks, benefits and alternatives for the proposed anesthesia with the patient or authorized representative who has indicated his/her understanding and acceptance.     Only emergency history available  Plan Discussed with: CRNA, Anesthesiologist and Surgeon  Anesthesia Plan Comments:        Anesthesia Quick Evaluation

## 2019-04-26 ENCOUNTER — Inpatient Hospital Stay (HOSPITAL_COMMUNITY): Payer: Medicaid Other

## 2019-04-26 ENCOUNTER — Other Ambulatory Visit: Payer: Self-pay

## 2019-04-26 DIAGNOSIS — S32501A Unspecified fracture of right pubis, initial encounter for closed fracture: Secondary | ICD-10-CM

## 2019-04-26 DIAGNOSIS — Z933 Colostomy status: Secondary | ICD-10-CM

## 2019-04-26 DIAGNOSIS — Z9359 Other cystostomy status: Secondary | ICD-10-CM

## 2019-04-26 DIAGNOSIS — S3660XA Unspecified injury of rectum, initial encounter: Secondary | ICD-10-CM

## 2019-04-26 DIAGNOSIS — S3730XA Unspecified injury of urethra, initial encounter: Secondary | ICD-10-CM

## 2019-04-26 DIAGNOSIS — T07XXXA Unspecified multiple injuries, initial encounter: Secondary | ICD-10-CM

## 2019-04-26 HISTORY — DX: Colostomy status: Z93.3

## 2019-04-26 LAB — BPAM FFP
Blood Product Expiration Date: 202009272359
Blood Product Expiration Date: 202009272359
Blood Product Expiration Date: 202009272359
Blood Product Expiration Date: 202009272359
ISSUE DATE / TIME: 202009252135
ISSUE DATE / TIME: 202009252135
ISSUE DATE / TIME: 202009252135
ISSUE DATE / TIME: 202009260837
Unit Type and Rh: 7300
Unit Type and Rh: 7300
Unit Type and Rh: 7300
Unit Type and Rh: 7300

## 2019-04-26 LAB — PREPARE FRESH FROZEN PLASMA
Unit division: 0
Unit division: 0
Unit division: 0

## 2019-04-26 LAB — COMPREHENSIVE METABOLIC PANEL
ALT: 37 U/L (ref 0–44)
AST: 72 U/L — ABNORMAL HIGH (ref 15–41)
Albumin: 3.4 g/dL — ABNORMAL LOW (ref 3.5–5.0)
Alkaline Phosphatase: 78 U/L (ref 38–126)
Anion gap: 14 (ref 5–15)
BUN: 6 mg/dL (ref 6–20)
CO2: 18 mmol/L — ABNORMAL LOW (ref 22–32)
Calcium: 8.2 mg/dL — ABNORMAL LOW (ref 8.9–10.3)
Chloride: 108 mmol/L (ref 98–111)
Creatinine, Ser: 0.8 mg/dL (ref 0.61–1.24)
GFR calc Af Amer: >60 mL/min (ref >60)
GFR calc non Af Amer: >60 mL/min (ref >60)
Glucose, Bld: 164 mg/dL — ABNORMAL HIGH (ref 70–99)
Potassium: 2.9 mmol/L — ABNORMAL LOW (ref 3.5–5.1)
Sodium: 140 mmol/L (ref 135–145)
Total Bilirubin: 0.7 mg/dL (ref 0.3–1.2)
Total Protein: 5.6 g/dL — ABNORMAL LOW (ref 6.5–8.1)

## 2019-04-26 LAB — CBC
HCT: 32.2 % — ABNORMAL LOW (ref 39.0–52.0)
Hemoglobin: 11 g/dL — ABNORMAL LOW (ref 13.0–17.0)
MCH: 35.9 pg — ABNORMAL HIGH (ref 26.0–34.0)
MCHC: 34.2 g/dL (ref 30.0–36.0)
MCV: 105.2 fL — ABNORMAL HIGH (ref 80.0–100.0)
Platelets: 200 10*3/uL (ref 150–400)
RBC: 3.06 MIL/uL — ABNORMAL LOW (ref 4.22–5.81)
RDW: 12.6 % (ref 11.5–15.5)
WBC: 6.4 10*3/uL (ref 4.0–10.5)
nRBC: 0 % (ref 0.0–0.2)

## 2019-04-26 LAB — POCT I-STAT 7, (LYTES, BLD GAS, ICA,H+H)
Acid-base deficit: 8 mmol/L — ABNORMAL HIGH (ref 0.0–2.0)
Bicarbonate: 18.4 mmol/L — ABNORMAL LOW (ref 20.0–28.0)
Calcium, Ion: 1.19 mmol/L (ref 1.15–1.40)
HCT: 31 % — ABNORMAL LOW (ref 39.0–52.0)
Hemoglobin: 10.5 g/dL — ABNORMAL LOW (ref 13.0–17.0)
O2 Saturation: 99 %
Patient temperature: 97.5
Potassium: 3 mmol/L — ABNORMAL LOW (ref 3.5–5.1)
Sodium: 139 mmol/L (ref 135–145)
TCO2: 20 mmol/L — ABNORMAL LOW (ref 22–32)
pCO2 arterial: 39.4 mmHg (ref 32.0–48.0)
pH, Arterial: 7.273 — ABNORMAL LOW (ref 7.350–7.450)
pO2, Arterial: 163 mmHg — ABNORMAL HIGH (ref 83.0–108.0)

## 2019-04-26 LAB — HIV ANTIBODY (ROUTINE TESTING W REFLEX): HIV Screen 4th Generation wRfx: NONREACTIVE

## 2019-04-26 LAB — PROTIME-INR
INR: 1.3 — ABNORMAL HIGH (ref 0.8–1.2)
Prothrombin Time: 15.6 seconds — ABNORMAL HIGH (ref 11.4–15.2)

## 2019-04-26 LAB — TRIGLYCERIDES: Triglycerides: 149 mg/dL (ref <150)

## 2019-04-26 LAB — MRSA PCR SCREENING: MRSA by PCR: NEGATIVE

## 2019-04-26 MED ORDER — INFLUENZA VAC SPLIT QUAD 0.5 ML IM SUSY
0.5000 mL | PREFILLED_SYRINGE | INTRAMUSCULAR | Status: AC
  Administered 2019-04-27: 0.5 mL via INTRAMUSCULAR
  Filled 2019-04-26: qty 0.5

## 2019-04-26 MED ORDER — POTASSIUM CHLORIDE 10 MEQ/100ML IV SOLN
10.0000 meq | INTRAVENOUS | Status: AC
  Administered 2019-04-26 (×4): 10 meq via INTRAVENOUS
  Filled 2019-04-26 (×2): qty 100

## 2019-04-26 MED ORDER — POTASSIUM CHLORIDE IN NACL 20-0.9 MEQ/L-% IV SOLN
INTRAVENOUS | Status: DC
  Administered 2019-04-26 (×2): via INTRAVENOUS
  Administered 2019-04-27: 900 mL via INTRAVENOUS
  Administered 2019-04-27 – 2019-04-28 (×4): via INTRAVENOUS
  Administered 2019-04-28: 900 mL via INTRAVENOUS
  Administered 2019-04-29 – 2019-05-06 (×7): via INTRAVENOUS
  Filled 2019-04-26 (×20): qty 1000

## 2019-04-26 MED ORDER — METOPROLOL TARTRATE 5 MG/5ML IV SOLN
INTRAVENOUS | Status: AC
  Filled 2019-04-26: qty 5

## 2019-04-26 MED ORDER — SODIUM CHLORIDE 0.9 % IV SOLN
INTRAVENOUS | Status: DC
  Administered 2019-04-26: 02:00:00 via INTRAVENOUS

## 2019-04-26 MED ORDER — FENTANYL CITRATE (PF) 100 MCG/2ML IJ SOLN: 50.0000 ug | Freq: Once | INTRAMUSCULAR | Status: DC

## 2019-04-26 MED ORDER — ZINC OXIDE 40 % EX OINT
TOPICAL_OINTMENT | Freq: Two times a day (BID) | CUTANEOUS | Status: DC | PRN
  Filled 2019-04-26: qty 57

## 2019-04-26 MED ORDER — IOHEXOL 300 MG/ML  SOLN
100.0000 mL | Freq: Once | INTRAMUSCULAR | Status: AC | PRN
  Administered 2019-04-26: 100 mL via INTRAVENOUS

## 2019-04-26 MED ORDER — METOPROLOL TARTRATE 5 MG/5ML IV SOLN
5.0000 mg | Freq: Four times a day (QID) | INTRAVENOUS | Status: DC | PRN
  Administered 2019-04-26 – 2019-04-28 (×2): 5 mg via INTRAVENOUS
  Filled 2019-04-26: qty 5

## 2019-04-26 MED ORDER — PNEUMOCOCCAL VAC POLYVALENT 25 MCG/0.5ML IJ INJ
0.5000 mL | INJECTION | INTRAMUSCULAR | Status: AC
  Administered 2019-04-27: 0.5 mL via INTRAMUSCULAR
  Filled 2019-04-26: qty 0.5

## 2019-04-26 MED ORDER — CHLORHEXIDINE GLUCONATE 0.12% ORAL RINSE (MEDLINE KIT)
15.0000 mL | Freq: Two times a day (BID) | OROMUCOSAL | Status: DC
  Administered 2019-04-26 – 2019-05-01 (×8): 15 mL via OROMUCOSAL

## 2019-04-26 MED ORDER — CHLORHEXIDINE GLUCONATE CLOTH 2 % EX PADS
6.0000 | MEDICATED_PAD | Freq: Every day | CUTANEOUS | Status: DC
  Administered 2019-04-26 – 2019-04-29 (×3): 6 via TOPICAL

## 2019-04-26 MED ORDER — ORAL CARE MOUTH RINSE
15.0000 mL | OROMUCOSAL | Status: DC
  Administered 2019-04-26 – 2019-04-27 (×12): 15 mL via OROMUCOSAL

## 2019-04-26 MED ORDER — PANTOPRAZOLE SODIUM 40 MG PO TBEC
40.0000 mg | DELAYED_RELEASE_TABLET | Freq: Every day | ORAL | Status: DC
  Filled 2019-04-26: qty 1

## 2019-04-26 MED ORDER — FENTANYL BOLUS VIA INFUSION
50.0000 ug | INTRAVENOUS | Status: DC | PRN
  Administered 2019-04-26 – 2019-04-27 (×9): 50 ug via INTRAVENOUS
  Filled 2019-04-26: qty 50

## 2019-04-26 MED ORDER — PROPOFOL 1000 MG/100ML IV EMUL
5.0000 ug/kg/min | INTRAVENOUS | Status: DC
  Administered 2019-04-26 (×3): 50 ug/kg/min via INTRAVENOUS
  Administered 2019-04-26: 87.443 ug/kg/min via INTRAVENOUS
  Administered 2019-04-26: 69.955 ug/kg/min via INTRAVENOUS
  Administered 2019-04-26 – 2019-04-27 (×5): 60 ug/kg/min via INTRAVENOUS
  Filled 2019-04-26 (×2): qty 100
  Filled 2019-04-26: qty 200
  Filled 2019-04-26 (×7): qty 100

## 2019-04-26 MED ORDER — DOCUSATE SODIUM 50 MG/5ML PO LIQD: 100.0000 mg | Freq: Two times a day (BID) | ORAL | Status: DC | PRN

## 2019-04-26 MED ORDER — "THROMBI-PAD 3""X3"" EX PADS"
1.0000 | MEDICATED_PAD | Freq: Once | CUTANEOUS | Status: AC
  Administered 2019-04-26: 1 via TOPICAL
  Filled 2019-04-26: qty 1

## 2019-04-26 MED ORDER — FENTANYL 2500MCG IN NS 250ML (10MCG/ML) PREMIX INFUSION
0.0000 ug/h | INTRAVENOUS | Status: DC
  Administered 2019-04-26: 100 ug/h via INTRAVENOUS
  Administered 2019-04-26: 75 ug/h via INTRAVENOUS
  Administered 2019-04-27: 175 ug/h via INTRAVENOUS
  Filled 2019-04-26 (×3): qty 250
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
## 2019-04-26 NOTE — Discharge Instructions (Signed)
Ostomy Support Information ° °You’ve heard that people get along just fine with only one of their eyes, or one of their lungs, or one of their kidneys. But you also know that you have only one intestine and only one bladder, and that leaves you feeling awfully empty, both physically and emotionally: You think no other people go around without part of their intestine with the ends of their intestines sticking out through their abdominal walls.  ° °YOU ARE NOT ALONE.  There are nearly three quarters of a million people in the [place] who have an ostomy; people who have had surgery to remove all or part of their colons or bladders.  ° °There is even a national association, the [employer] with over 350 local affiliated support groups that are organized by volunteers who provide peer support and counseling. [employer] has a toll free telephone num-ber, 800-826-0826 and an educational, interactive website, www.ostomy.org  ° °An ostomy is an opening in the belly (abdominal wall) made by surgery. Ostomates are people who have had this procedure. The opening (stoma) allows the kidney or bowel to discharge waste. An external pouch covers the stoma to collect waste. Pouches are are a simple bag and are odor free. Different companies have disposable or reusable pouches to fit one's lifestyle. An ostomy can either be temporary or permanent.  ° °THERE ARE THREE MAIN TYPES OF OSTOMIES °· Colostomy. A colostomy is a surgically created opening in the large intestine (colon). °· Ileostomy. An ileostomy is a surgically created opening in the small intestine. °· Urostomy. A urostomy is a surgically created opening to divert urine away from the bladder. ° °OSTOMY Care ° °The following guidelines will make care of your colostomy easier. Keep this information close by for quick reference. ° °Helpful DIET hints °Eat a well-balanced diet including vegetables and fresh fruits. Eat on a regular schedule. Drink at least 6 to 8  glasses of fluids daily. °Eat slowly in a relaxed atmosphere. Chew your food thoroughly. Avoid chewing gum, smoking, and drinking from a straw. This will help decrease the amount of air you swallow, which may help reduce gas. °Eating yogurt or drinking buttermilk may help reduce gas. ° °To control gas at night, do not eat after 8 p.m. This will give your bowel time to quiet down before you go to bed. ° °If gas is a problem, you can purchase Beano. Sprinkle Beano on the first bite of food before eating to reduce gas. It has no flavor and should not change the taste of your food. You can buy Beano over the counter at your local drugstore. ° °Foods like fish, onions, garlic, broccoli, asparagus, and cabbage produce odor. Although your pouch is odor-proof, if you eat these foods you may notice a stronger odor when emptying your pouch. If this is a concern, you may want to limit these foods in your diet. ° °If you have an ileostomy, you will have chronic diarrhea & need to drink more liquids to avoid getting dehydrated.  Consider antidiarrheal medicine like imodium (loperamide) or Lomotil to help slow down bowel movements / diarrhea into your ileostomy bag. ° °GETTING TO GOOD BOWEL HEALTH WITH AN ILEOSTOMY °. °Irregular bowel habits such as constipation and diarrhea can lead to many problems over time.  The goal: 3-6 small BOWEL MOVEMENTS A DAY!  To have soft, regular bowel movements:  °• Drink plenty of fluids, consider 4-6 tall glasses of water a day.   ° °Controlling   diarrheA ° °o Switch to liquids and simpler foods for a few days to avoid stressing your intestines further. °o Avoid dairy products (especially milk & ice cream) for a short time.  The intestines often can lose the ability to digest lactose when stressed. °o Avoid foods that cause gassiness or bloating.  Typical foods include beans and other legumes, cabbage, broccoli, and dairy foods.  Every person has some sensitivity to other foods, so listen to our  body and avoid those foods that trigger problems for you. °o Adding fiber (Citrucel, Metamucil, psyllium, Miralax) gradually can help thicken stools by absorbing excess fluid and retrain the intestines to act more normally.  Slowly increase the dose over a few weeks.  Too much fiber too soon can backfire and cause cramping & bloating. °o Probiotics (such as active yogurt, Align, etc) may help repopulate the intestines and colon with normal bacteria and calm down a sensitive digestive tract.  Most studies show it to be of mild help, though, and such products can be costly. °o Medicines: °- Bismuth subsalicylate (ex. Kayopectate, Pepto Bismol) every 30 minutes for up to 6 doses can help control diarrhea.  Avoid if pregnant. °- Loperamide (Immodium) can slow down diarrhea.  Start with two tablets (4mg total) first and then try one tablet every 6 hours.  Avoid if you are having fevers or severe pain.  If you are not better or start feeling worse, stop all medicines and call your doctor for advice °o Call your doctor if you are getting worse or not better.  Sometimes further testing (cultures, endoscopy, X-ray studies, bloodwork, etc) may be needed to help diagnose and treat the cause of the diarrhea. ° °TROUBLESHOOTING IRREGULAR BOWELS °1) Avoid extremes of bowel movements (no bad constipation/diarrhea) °2) Miralax 17gm mixed in 8oz. water or juice-daily. May use twice a day as needed  °3) Gas-x,Phazyme, etc. as needed for gas & bloating.  °4) Soft,bland diet. No spicy,greasy,fried foods.  °5) Prilosec (omeprazole) over-the-counter as needed  °6) May hold gluten/wheat products from diet to see if symptoms improve.  °7)  May try probiotics (Align, Activa, etc) to help calm the bowels down °7) If symptoms become worse call back immediately. ° ° °Applying the pouching system °To apply your pouch, follow these steps: ° °Place all your equipment close at hand before removing your pouch. ° °Wash your hands. ° °Stand or sit in  front of a mirror. Use the position that works best for you. Remember that you must keep the skin around the stoma wrinkle-free for a good seal. ° °Gently remove the used pouch (1-piece system) or the pouch and old wafer (2-piece system). Empty the pouch into the toilet. Save the closure clip to use again. ° °Wash the stoma itself and the skin around the stoma. Your stoma may bleed a little when being washed. This is normal. Rinse and pat dry. You may use a wash cloth or soft paper towels (like Bounty), mild soap (like Dial, Safeguard, or Ivory), and water. Avoid soaps that contain perfumes or lotions. ° °For a new pouch (1-piece system) or a new wafer (2-piece system), measure your stoma using the stoma guide in each box of supplies. ° °Trace the shape of your stoma onto the back of the new pouch or the back of the new wafer. Cut out the opening. Remove the paper backing and set it aside. ° °Optional: Apply a skin barrier powder to surrounding skin if it is irritated (bare or weeping),   and dust off the excess. °Optional: Apply a skin-prep wipe (such as Skin Prep or All-Kare) to the skin around the stoma, and let it dry. Do not apply this solution if the skin is irritated (red, tender, or broken) or if you have shaved around the stoma. °Optional: Apply a skin barrier paste (such as Stomahesive, Coloplast, or Premium) around the opening cut in the back of the pouch or wafer. Allow it to dry for 30 to 60 seconds. ° °Hold the pouch (1-piece system) or wafer (2-piece system) with the sticky side toward your body. Make sure the skin around the stoma is wrinkle-free. Center the opening on the stoma, then press firmly to your abdomen (Fig. 4). Look in the mirror to check if you are placing the pouch, or wafer, in the right position. For a 2-piece system, snap the pouch onto the wafer. Make sure it snaps into place securely. ° °Place your hand over the stoma and the pouch or wafer for about 30 seconds. The heat from your  hand can help the pouch or wafer stick to your skin. ° °Add deodorant (such as Super Banish or Nullo) to your pouch. Other options include food extracts such as vanilla oil and peppermint extract. Add about 10 drops of the deodorant to the pouch. Then apply the closure clamp. Note: Do not use toxic ° chemicals or commercial cleaning agents in your pouch. These substances may harm the stoma. ° °Optional: For extra seal, apply tape to all 4 sides around the pouch or wafer, as if you were framing a picture. You may use any brand of medical adhesive tape. °Change your pouch every 5 to 7 days. Change it immediately if a leak occurs.  Wash your hands afterwards. ° °If you are wearing a 2-piece system, you may use 2 new pouches per week and alternate them. Rinse the pouch with mild soap and warm water and hang it to dry for the next day. Apply the fresh pouch. Alternate the 2 pouches like this for a week. After a week, change the wafer and begin with 2 new pouches. Place the old pouches in a plastic bag, and put them in the trash. ° ° ° °Tips for colostomy care ° °Applying Your Pouch °You may stand or sit to apply your pouch. ° °Keep the skin where you apply the pouch wrinkle-free. If the skin around the pouch is wrinkled, the seal may break when your skin stretches. ° °If hair grows close to your stoma, you may trim off the hair with scissors, an electric razor, or a safety razor. ° °Always have a mirror nearby so you can get a better view of your stoma. ° °When you apply a new pouch, write the date on the adhesive tape. This will remind you of when you last changed your pouch. ° °Changing Your Pouch °The best time to change your pouch is in the morning, before eating or drinking anything. Your stoma can function at any time, but it will function more after eating or drinking. ° °Emptying Your Pouch °Empty your pouch when it is one-third full (of urine, stool, and/or gas). If you wait until your pouch is fuller than this,  it will be more difficult to empty and more noticeable. °When you empty your pouch, either put toilet paper in the toilet bowl first, or flush the toilet while you empty the pouch. This will reduce splashing. You can empty the pouch between your legs or to one side while sitting,   or while standing or stooping. If you have a 2-piece system, you can snap off the pouch to empty it. Remember that your stoma may function during this time. °If you wish to rinse your pouch after you empty it, a turkey baster can be helpful. When using a baster, squirt water up into the pouch through the opening at the °bottom. With a 2-piece system, you can snap off the pouch to rinse it. After rinsing  your pouch, empty it into the toilet. °When rinsing your pouch at home, put a few granules of Dreft soap in the rinse water. This helps lubricate and freshen your pouch. °The inside of your pouch can be sprayed with non-stick cooking oil (Pam spray). This may help reduce stool sticking to the inside of the pouch. ° °Bathing °You may shower or bathe with your pouch on or off. Remember that your stoma may function during this time. ° °The materials you use to wash your stoma and the skin around it should be clean, but they do not need to be sterile. ° °Wearing Your Pouch °During hot weather, or if you perspire a lot in general, wear a cover over your pouch. This may prevent a rash on your skin under the pouch. Pouch covers are sold at ostomy supply stores. °Wear the pouch inside your underwear for better support. °Watch your weight. Any gain or loss of 10 to 15 pounds or more can change the way your pouch fits. ° °Going Away From Home °A collapsible cup (like those that come in travel kits) or a soft plastic squirt bottle with a pull-up top (like a travel bottle for shampoo) can be used for rinsing your pouch when you are away from home. Tilt the opening of the pouch at an upward angle when using a cup to rinse. ° °Carry wet wipes or extra  tissues to use in public bathrooms. ° °Carry an extra pouching system with you at all times. ° °Never keep ostomy supplies in the glove compartment of your car. Extreme heat or cold can damage the skin barriers and adhesive wafers on the pouch. ° °When you travel, carry your ostomy supplies with you at all times. Keep them within easy reach. Do not pack ostomy supplies in baggage that will be checked or otherwise separated from you, because your baggage might be lost. If you’re traveling out of the country, it is helpful to have a letter stating that you are carrying ostomy supplies as a medical necessity. ° °If you need ostomy supplies while traveling, look in the yellow pages of the telephone book under “Surgical Supplies.” Or call the local ostomy organization to find out where supplies are available. ° °Do not let your ostomy supplies get low. Always order new pouches before you use the last one. ° °Reducing Odor °Limit foods such as broccoli, cabbage, onions, fish, and garlic in your diet to help reduce odor. °Each time you empty your pouch, carefully clean the opening of the pouch, both inside and outside, with toilet paper. °Rinse your pouch 1 or 2 times daily after you empty it (see directions for emptying your pouch and going away from home). °Add deodorant (such as Super Banish or Nullo) to your pouch. °Use air deodorizers in your bathroom. °Do not add aspirin to your pouch. Even though aspirin can help prevent odor, it could cause ulcers on your stoma. ° °When to call the doctor °Call the doctor if you have any of the following symptoms: °Purple, black,   or white stoma Severe cramps lasting more than 6 hours Severe watery discharge from the stoma lasting more than 6 hours No output from the colostomy for 3 days Excessive bleeding from your stoma Swelling of your stoma to more than 1/2-inch larger than usual Pulling inward of your stoma below skin level Severe skin irritation or deep ulcers Bulging  or other changes in your abdomen  When to call your ostomy nurse Call your ostomy/enterostomal therapy (ET) nurse if any of the following occurs: Frequent leaking of your pouching system Change in size or appearance of your stoma, causing discomfort or problems with your pouch Skin rash or rawness Weight gain or loss that causes problems with your pouch      FREQUENTLY ASKED QUESTIONS   Why havent you met any of these folks who have an ostomy?  Well, maybe you have! You just did not recognize them because an ostomy doesn't show. It can be kept secret if you wish. Why, maybe some of your best friends, office associates or neighbors have an ostomy ... you never can tell.   People facing ostomy surgery have many quality-of-life questions like:  Will you bulge? Smell? Make noises? Will you feel waste leaving your body? Will you be a captive of the toilet? Will you starve? Be a social outcast? Get/stay married? Have babies? Easily bathe, go swimming, bend over?  OK, lets look at what you can expect:   Will you bulge?  Remember, without part of the intestine or bladder, and its contents, you should have a flatter tummy than before. You can expect to wear, with little exception, what you wore before surgery ... and this in-cludes tight clothing and bathing suits.   Will you smell?  Today, thanks to modern odor proof pouching systems, you can walk into an ostomy support group meeting and not smell anything that is foul or offensive. And, for those with an ileostomy or colostomy who are concerned about odor when emptying their pouch, there are in-pouch deodorants that can be used to eliminate any waste odors that may exist.   Will you make noises?  Everyone produces gas, especially if they are an air-swallower. But intestinal sounds that occur from time to time are no differ-ent than a gurgling tummy, and quite often your clothing will muffle any sounds.   Will you feel the waste discharges?   For those with a colostomy or ileostomy there might be a slight pressure when waste leaves your body, but understand that the intestines have no nerve endings, so there will be no unpleasant sensations. Those with a urostomy will probably be unaware of any kidney drainage.   Will you be a captive of the toilet?  Immediately post-op you will spend more time in the bathroom than you will after your body recovers from surgery. Every person is different, but on average those with an ileostomy or urostomy may empty their pouches 4 to 6 times a day; a little  less if you have a colostomy. The average wear time between pouch system changes is 3 to 5 days and the changing process should take less than 30 minutes.   Will I need to be on a special diet? Most people return to their normal diet when they have recovered from surgery. Be sure to chew your food well, eat a well-balanced diet and drink plenty of fluids. If you experience problems with a certain food, wait a couple of weeks and try it again.  Will there be  odor and noises? Pouching systems are designed to be odor-proof or odor-resistant. There are deodorants that can be used in the pouch. Medications are also available to help reduce odor. Limit gas-producing foods and carbonated beverages. You will experience less gas and fewer noises as you heal from surgery.  How much time will it take to care for my ostomy? At first, you may spend a lot of time learning about your ostomy and how to take care of it. As you become more comfortable and skilled at changing the pouching system, it will take very little time to care for it.   Will I be able to return to work? People with ostomies can perform most jobs. As soon as you have healed from surgery, you should be able to return to work. Heavy lifting (more than 10 pounds) may be discouraged.   What about intimacy? Sexual relationships and intimacy are important and fulfilling aspects of your life. They  should continue after ostomy surgery. Intimacy-related concerns should be discussed openly between you and your partner.   Can I wear regular clothing? You do not need to wear special clothing. Ostomy pouches are fairly flat and barely noticeable. Elastic undergarments will not hurt the stoma or prevent the ostomy from functioning.   Can I participate in sports? An ostomy should not limit your involvement in sports. Many people with ostomies are runners, skiers, swimmers or participate in other active lifestyles. Talk with your caregiver first before doing heavy physical activity.  Will you starve?  Not if you follow doctors orders at each stage of your post-op adjustment. There is no such thing as an ostomy diet. Some people with an ostomy will be able to eat and tolerate anything; others may find diffi-culty with some foods. Each person is an individual and must determine, by trial, what is best for them. A good practice for all is to drink plenty of water.   Will you be a social outcast?  Have you met anyone who has an ostomy and is a social outcast? Why should you be the first? Only your attitude and self image will effect how you are treated. No confi-dent person is an Occupational psychologist.     PROFESSIONAL HELP  Resources are available if you need help or have questions about your ostomy.    Specially trained nurses called Wound, Ostomy Continence Nurses (WOCN) are available for consultation in most major medical centers.   Consider getting an ostomy consult at one of the outpatient ostomy clinics.  [facility] one in [place] at this time   The [employer] ([employer]) is a group made up of many local chapters throughout the [place]. These local groups hold meetings and provide support to prospective and existing ostomates. They sponsor educational events and have qualified visitors to make personal or telephone visits. Contact the [employer] for the chapter nearest you and for other  educational publications.   More detailed information can be found in Colostomy Guide, a publication of the [employer] ([employer]OA). Contact [employer] at [phone] or visit their web site at [email or web address] The website contains links to other sites, suppliers and resources.   Tree [employer] Start Services:  Start at the website to enlist for support.  Your Wound Ostomy (WOCN) nurse may have started this process. https://www.hollister.com/en/securestart  Secure Start services are designed to support people as they live their lives with an ostomy or neurogenic bladder. Enrolling is easy and at no cost to the patient. We realize that  each person's needs and life journey are different. Through Secure Start services, we want to help people live their life, their way.    Suprapubic Catheter Home Guide A suprapubic catheter is a flexible tube that is used to drain urine from the bladder into a collection bag outside the body. The catheter is inserted into the bladder through a small opening in the lower abdomen, above the pubic bone (suprapubic area) and a few inches below your belly button (navel). A tiny balloon filled with germ-free (sterile) water helps to keep the catheter in place. The collection bag must be emptied at least once a day and cleaned at least every other day. The collection bag can be put beside your bed at night and attached to your leg during the day. You may have a large collection bag to use at night and a smaller one to use during the day. Your suprapubic catheter may need to be changed every 4-6 weeks, or as often as recommended by your health care provider. Healing of the tract where the catheter is placed can take 6 weeks to 6 months. During that time, your health care provider may change your catheter. Once the tract is well healed, you or a caregiver will change your suprapubic catheter at home. What are the risks? This catheter is safe to use. However, problems can  occur, including:  Blocked urine flow. This can occur if the catheter stops working, or if you have a blood clot in your bladder or in the catheter.  Irritation of the skin around the catheter.  Infection. This can happen if bacteria gets into your bladder. Supplies needed:  Two pairs of sterile gloves.  Paper towels.  Catheter.  Two syringes.  Sterile water.  Sterile cleaning solution.  Lubricant.  Collection bags. How to change the catheter  1. Drink plenty of fluids during the hours before you change the catheter. 2. Wash your hands with soap and water. If soap and water are not available, use hand sanitizer. 3. Draw up sterile water into a syringe to have ready to fill the new catheter balloon. The amount will depend on the size of the balloon. 4. Have all of your supplies ready and close to you on a paper towel. 5. Lie on your back, sitting slightly upright so that you can see the catheter and opening. 6. Put on sterile gloves. 7. Clean the skin around the catheter opening using the sterile cleaning solution. 8. Remove the water from the balloon in the catheter using a syringe. 9. Slowly remove the catheter. If the catheter seems stuck, or if you have difficulty removing it: ? Do not pull on it. ? Call your health care provider right away. 10. Place the old catheter on a paper towel to discard later. 11. Take off the used gloves, and put on a new pair. 12. Put lubricant on the end of the new catheter that will go into your bladder. 13. Clean the skin around the catheter opening using the sterile cleaning solution. 14. Gently slide the catheter through the opening in your abdomen and into the tract that leads to your bladder. 15. Wait for some urine to start flowing through the catheter. 16. When urine starts to flow through the catheter, attach the collection bag to the end of the catheter. Make sure the connection is tight. 17. Use a syringe to fill the catheter  balloon with sterile water. Fill to the amount directed by your health care provider. 18. Remove  the gloves and wash your hands with soap and water. How to care for the skin around the catheter Follow your health care provider's instructions on caring for your skin.  Use a clean washcloth and soapy water to clean the skin around your catheter every day. Pat the area dry with a clean paper towel.  Do not pull on the catheter.  Do not use ointment or lotion on this area, unless told by your health care provider.  Check the skin around the catheter every day for signs of infection. Check for: ? Redness, swelling, or pain. ? Fluid or blood. ? Warmth. ? Pus or a bad smell. How to empty and clean the collection bag Empty the large collection bag every 8 hours. Empty the small collection bag when it is about ? full. Clean the collection bag every 2-3 days, or as often as told by your health care provider. To do this: 1. Wash your hands with soap and water. If soap and water are not available, use hand sanitizer. 2. Disconnect the bag from the catheter and immediately attach a new bag to the catheter. 3. Hold the used bag over the toilet or another container. 4. Turn the valve (spigot) at the bottom of the bag to empty the urine. Empty the used bag completely. ? Do not touch the opening of the spigot. ? Do not let the opening touch the toilet or container. 5. Close the spigot tightly when the bag is empty. 6. Clean the used bag in one of the following methods: ? According to the manufacturer's instructions. ? As told by your health care provider. 7. Let the bag dry completely. Put it in a clean plastic bag before storing it. General tips   Always wash your hands before and after caring for your catheter and collection bag. Use a mild, fragrance-free soap. If soap and water are not available, use hand sanitizer.  Clean the outside of the catheter with soap and water as often as told by your  health care provider.  Always make sure there are no twists or kinks in the catheter tube.  Always make sure there are no leaks in the catheter or collection bag.  Always wear the leg bag below your knee.  Make sure the overnight drainage bag is always lower than the level of your bladder, but do not let it touch the floor. Before you go to sleep, hang the bag inside a wastebasket that is covered by a clean plastic bag.  Drink enough fluid to keep your urine pale yellow.  Do not take baths, swim, or use a hot tub until your health care provider approves. Ask your health care provider if you may take showers. Contact a heath care provider if:  You leak urine.  You have redness, swelling, or pain around your catheter.  You have fluid or blood coming from your catheter opening.  Your catheter opening feels warm to the touch.  You have pus or a bad smell coming from your catheter opening.  You have a fever or chills.  Your urine flow slows down.  Your urine becomes cloudy or smelly. Get help right away if:  Your catheter comes out.  You have: ? Nausea. ? Back pain. ? Difficulty changing your catheter. ? Blood in your urine. ? No urine flow for 1 hour. Summary  A suprapubic catheter is a flexible tube that is used to drain urine from the bladder into a collection bag outside the body.  Your suprapubic catheter may need to be changed every 4-6 weeks, or as recommended by your health care provider.  Follow instructions on how to change the catheter and how to empty and clean the collection bag.  Always wash your hands before and after caring for your catheter and collection bag. Drink enough fluid to keep your urine pale yellow.  Get help right away if you have difficulty changing your catheter or if there is blood in your urine. This information is not intended to replace advice given to you by your health care provider. Make sure you discuss any questions you have with  your health care provider. Document Released: 04/04/2011 Document Revised: 11/07/2018 Document Reviewed: 08/21/2018 Elsevier Patient Education  2020 [employer].
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
## 2019-04-26 NOTE — Transfer of Care (Signed)
Immediate Anesthesia Transfer of Care Note  Patient: Dylan Snyder  Procedure(s) Performed: EXPLORATORY LAPAROTOMY (N/A Abdomen) Cystoscopy Flexible (N/A Urethra) Colostomy (Left Abdomen)  Patient Location: ICU  Anesthesia Type:General  Level of Consciousness: Patient remains intubated per anesthesia plan  Airway & Oxygen Therapy: Patient remains intubated per anesthesia plan and Patient placed on Ventilator (see vital sign flow sheet for setting)  Post-op Assessment: Report given to RN and Post -op Vital signs reviewed and stable  Post vital signs: Reviewed and stable  Last Vitals:  Vitals Value Taken Time  BP    Temp    Pulse 125 04/26/19 0044  Resp 17 04/26/19 0044  SpO2 100 % 04/26/19 0044  Vitals shown include unvalidated device data.  Last Pain:  Vitals:   04/25/19 2115  TempSrc:   PainSc: 10-Worst pain ever         Complications: No apparent anesthesia complications

## 2019-04-26 NOTE — Progress Notes (Signed)
Pt's  Aline had backed blood flow after RN had drawn blood.  The line was changed and redressed per protocol with good waveform.
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
## 2019-04-26 NOTE — Progress Notes (Signed)
I have reviewed the xray and CT images.  There is no disruption to the pelvic ring.  Recommend symptomatic treatment.  He can be WBAT BLE.  Mobilize with PT when able.  May follow up with me in the office in 2 weeks.  Azucena Cecil, MD [facility] [phone] 3:42 PM

## 2019-04-26 NOTE — Progress Notes (Signed)
RT and RN transported pt from 2H01 to CT and back without complication. Pts respiratory status stable throughout transport. RT will continue to monitor.
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
## 2019-04-27 ENCOUNTER — Inpatient Hospital Stay (HOSPITAL_COMMUNITY): Payer: Medicaid Other

## 2019-04-27 LAB — COMPREHENSIVE METABOLIC PANEL
ALT: 29 U/L (ref 0–44)
AST: 60 U/L — ABNORMAL HIGH (ref 15–41)
Albumin: 2.6 g/dL — ABNORMAL LOW (ref 3.5–5.0)
Alkaline Phosphatase: 45 U/L (ref 38–126)
Anion gap: 8 (ref 5–15)
BUN: 8 mg/dL (ref 6–20)
CO2: 23 mmol/L (ref 22–32)
Calcium: 8 mg/dL — ABNORMAL LOW (ref 8.9–10.3)
Chloride: 106 mmol/L (ref 98–111)
Creatinine, Ser: 0.77 mg/dL (ref 0.61–1.24)
GFR calc Af Amer: >60 mL/min (ref >60)
GFR calc non Af Amer: >60 mL/min (ref >60)
Glucose, Bld: 105 mg/dL — ABNORMAL HIGH (ref 70–99)
Potassium: 3.6 mmol/L (ref 3.5–5.1)
Sodium: 137 mmol/L (ref 135–145)
Total Bilirubin: 1.3 mg/dL — ABNORMAL HIGH (ref 0.3–1.2)
Total Protein: 4.7 g/dL — ABNORMAL LOW (ref 6.5–8.1)

## 2019-04-27 LAB — CBC
HCT: 25.9 % — ABNORMAL LOW (ref 39.0–52.0)
Hemoglobin: 8.5 g/dL — ABNORMAL LOW (ref 13.0–17.0)
MCH: 35.9 pg — ABNORMAL HIGH (ref 26.0–34.0)
MCHC: 32.8 g/dL (ref 30.0–36.0)
MCV: 109.3 fL — ABNORMAL HIGH (ref 80.0–100.0)
Platelets: 130 10*3/uL — ABNORMAL LOW (ref 150–400)
RBC: 2.37 MIL/uL — ABNORMAL LOW (ref 4.22–5.81)
RDW: 12.6 % (ref 11.5–15.5)
WBC: 6.8 10*3/uL (ref 4.0–10.5)
nRBC: 0 % (ref 0.0–0.2)

## 2019-04-27 LAB — TRIGLYCERIDES: Triglycerides: 141 mg/dL (ref <150)

## 2019-04-27 LAB — MAGNESIUM: Magnesium: 1.5 mg/dL — ABNORMAL LOW (ref 1.7–2.4)

## 2019-04-27 MED ORDER — PANTOPRAZOLE SODIUM 40 MG PO PACK: 40.0000 mg | PACK | Freq: Every day | ORAL | Status: DC

## 2019-04-27 MED ORDER — ORAL CARE MOUTH RINSE
15.0000 mL | Freq: Two times a day (BID) | OROMUCOSAL | Status: DC
  Administered 2019-04-27 – 2019-05-01 (×6): 15 mL via OROMUCOSAL

## 2019-04-27 MED ORDER — MAGNESIUM SULFATE 50 % IJ SOLN: 6.0000 g | Freq: Once | INTRAMUSCULAR | Status: DC

## 2019-04-27 MED ORDER — FENTANYL CITRATE (PF) 100 MCG/2ML IJ SOLN
50.0000 ug | INTRAMUSCULAR | Status: DC | PRN
  Administered 2019-04-27: 50 ug via INTRAVENOUS
  Administered 2019-04-27: 100 ug via INTRAVENOUS
  Administered 2019-04-27: 50 ug via INTRAVENOUS
  Administered 2019-04-28 – 2019-05-03 (×7): 100 ug via INTRAVENOUS
  Administered 2019-05-04: 50 ug via INTRAVENOUS
  Administered 2019-05-05: 100 ug via INTRAVENOUS
  Administered 2019-05-05: 50 ug via INTRAVENOUS
  Administered 2019-05-06: 100 ug via INTRAVENOUS
  Filled 2019-04-27 (×14): qty 2

## 2019-04-27 MED ORDER — MAGNESIUM SULFATE 4 GM/100ML IV SOLN
4.0000 g | Freq: Once | INTRAVENOUS | Status: AC
  Administered 2019-04-27: 4 g via INTRAVENOUS
  Filled 2019-04-27: qty 100

## 2019-04-27 MED ORDER — ONDANSETRON HCL 4 MG/2ML IJ SOLN: 4.0000 mg | INTRAMUSCULAR | Status: DC | PRN

## 2019-04-27 MED ORDER — DEXMEDETOMIDINE HCL IN NACL 400 MCG/100ML IV SOLN
0.2000 ug/kg/h | INTRAVENOUS | Status: DC
  Administered 2019-04-27: 0.4 ug/kg/h via INTRAVENOUS
  Administered 2019-04-27: 0.602 ug/kg/h via INTRAVENOUS
  Administered 2019-04-27: 0.5 ug/kg/h via INTRAVENOUS
  Administered 2019-04-28: 0.7 ug/kg/h via INTRAVENOUS
  Filled 2019-04-27 (×4): qty 100

## 2019-04-27 NOTE — Anesthesia Postprocedure Evaluation (Signed)
Anesthesia Post Note  Patient: Dylan Snyder  Procedure(s) Performed: EXPLORATORY LAPAROTOMY (N/A Abdomen) Cystoscopy Flexible (N/A Urethra) Colostomy (Left Abdomen)     Patient location during evaluation: SICU Anesthesia Type: General Level of consciousness: sedated Pain management: pain level controlled Vital Signs Assessment: post-procedure vital signs reviewed and stable Respiratory status: patient remains intubated per anesthesia plan Cardiovascular status: stable Postop Assessment: no apparent nausea or vomiting Anesthetic complications: no    Last Vitals:  Vitals:   04/27/19 0700 04/27/19 0715  BP: 97/70   Pulse: 97 96  Resp: 15 15  Temp:    SpO2: 100% 100%    Last Pain:  Vitals:   04/27/19 0400  TempSrc: Oral  PainSc: [place]

## 2019-04-27 NOTE — Plan of Care (Signed)
  Problem: Clinical Measurements: Goal: Ability to maintain clinical measurements within normal limits will improve Outcome: Progressing Goal: Will remain free from infection Outcome: Progressing Goal: Respiratory complications will improve Outcome: Progressing Goal: Cardiovascular complication will be avoided Outcome: Progressing   Problem: Coping: Goal: Ability to demonstrate appropriate behavior when angry will improve Outcome: Progressing
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
## 2019-04-27 NOTE — Plan of Care (Signed)
  Problem: Clinical Measurements: Goal: Respiratory complications will improve Outcome: Progressing   Pt is synchronous with the vent, all lung fields sound clear, no coughing noted, minimal secretions noted. Oxygen saturation has mantained 100% all night on 40% fio2. Problem: Coping: Goal: Level of anxiety will decrease Outcome: Progressing   Pt is alert to voice and opens eyes spontaneously at times. Pt is able to track, follows commands, and is able to move all extremities. Pt can get agitated at times but when asked about pain, pt is able to nod or move head from side to side.   Problem: Elimination: Goal: Will not experience complications related to urinary retention Outcome: Progressing   Pt has had some urine output throughout the night, no blood clots noted. Problem: Pain Managment: Goal: General experience of comfort will improve Outcome: Progressing   Pt is able to communicate whether he is hurting or not, medications managed accordingly by RN (see MAR).  Problem: Skin Integrity: Goal: Risk for impaired skin integrity will decrease Outcome: Progressing   Pt has had minimal sanguinous drainage from wound dressings, no further skin break down noted.   Goal: Ability to interact with others will improve Outcome: Progressing  Pt is able to respond to simple questions when asked.

## 2019-04-27 NOTE — Progress Notes (Signed)
Pt removed right radial aline. Pt stated, "I am sorry, I thought I didn't need it anymore. Is it time to get up now?" Pt was reoriented and repositioned in bed. Pt verbalized understanding and apologized for taking out line. Pt's gown was changed. Minimal bleeding noticed. Call was placed within reach, bed alarm is on, and bed is in lowest position.
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
## 2019-04-27 NOTE — Procedures (Signed)
Extubation Procedure Note  Patient Details:   Name: Dylan Snyder DOB: 06/26/1971 MRN: [id number]   Airway Documentation:    Vent end date: (not recorded) Vent end time: (not recorded)   Evaluation  O2 sats: stable throughout Complications: No apparent complications Patient did tolerate procedure well. Bilateral Breath Sounds: Clear   Yes   Pt extubated per MD order. Pt placed on 4L [place] with sats of 100%.  RT will continue to monitor.  Pierre Bali 04/27/2019, 10:33 AM

## 2019-04-28 ENCOUNTER — Other Ambulatory Visit: Payer: Self-pay

## 2019-04-28 ENCOUNTER — Encounter (HOSPITAL_COMMUNITY): Payer: Self-pay

## 2019-04-28 LAB — BASIC METABOLIC PANEL
Anion gap: 11 (ref 5–15)
BUN: 9 mg/dL (ref 6–20)
CO2: 19 mmol/L — ABNORMAL LOW (ref 22–32)
Calcium: 8.1 mg/dL — ABNORMAL LOW (ref 8.9–10.3)
Chloride: 102 mmol/L (ref 98–111)
Creatinine, Ser: 0.78 mg/dL (ref 0.61–1.24)
GFR calc Af Amer: >60 mL/min (ref >60)
GFR calc non Af Amer: >60 mL/min (ref >60)
Glucose, Bld: 105 mg/dL — ABNORMAL HIGH (ref 70–99)
Potassium: 3.9 mmol/L (ref 3.5–5.1)
Sodium: 132 mmol/L — ABNORMAL LOW (ref 135–145)

## 2019-04-28 LAB — CBC
HCT: 22.4 % — ABNORMAL LOW (ref 39.0–52.0)
HCT: 23.2 % — ABNORMAL LOW (ref 39.0–52.0)
HCT: 25.9 % — ABNORMAL LOW (ref 39.0–52.0)
Hemoglobin: 7.5 g/dL — ABNORMAL LOW (ref 13.0–17.0)
Hemoglobin: 7.9 g/dL — ABNORMAL LOW (ref 13.0–17.0)
Hemoglobin: 9 g/dL — ABNORMAL LOW (ref 13.0–17.0)
MCH: 35 pg — ABNORMAL HIGH (ref 26.0–34.0)
MCH: 35.4 pg — ABNORMAL HIGH (ref 26.0–34.0)
MCH: 36.1 pg — ABNORMAL HIGH (ref 26.0–34.0)
MCHC: 33.5 g/dL (ref 30.0–36.0)
MCHC: 34.1 g/dL (ref 30.0–36.0)
MCHC: 34.7 g/dL (ref 30.0–36.0)
MCV: 104.7 fL — ABNORMAL HIGH (ref 80.0–100.0)
MCV: 104 fL — ABNORMAL HIGH (ref 80.0–100.0)
MCV: 104 fL — ABNORMAL HIGH (ref 80.0–100.0)
Platelets: 130 10*3/uL — ABNORMAL LOW (ref 150–400)
Platelets: 139 10*3/uL — ABNORMAL LOW (ref 150–400)
Platelets: 153 10*3/uL (ref 150–400)
RBC: 2.14 MIL/uL — ABNORMAL LOW (ref 4.22–5.81)
RBC: 2.23 MIL/uL — ABNORMAL LOW (ref 4.22–5.81)
RBC: 2.49 MIL/uL — ABNORMAL LOW (ref 4.22–5.81)
RDW: 12.1 % (ref 11.5–15.5)
RDW: 12 % (ref 11.5–15.5)
RDW: 11.9 % (ref 11.5–15.5)
WBC: 8 10*3/uL (ref 4.0–10.5)
WBC: 8.5 10*3/uL (ref 4.0–10.5)
WBC: 9.6 10*3/uL (ref 4.0–10.5)
nRBC: 0 % (ref 0.0–0.2)
nRBC: 0 % (ref 0.0–0.2)
nRBC: 0 % (ref 0.0–0.2)

## 2019-04-28 LAB — POCT I-STAT 7, (LYTES, BLD GAS, ICA,H+H)
Acid-base deficit: 3 mmol/L — ABNORMAL HIGH (ref 0.0–2.0)
Acid-base deficit: 5 mmol/L — ABNORMAL HIGH (ref 0.0–2.0)
Bicarbonate: 23.5 mmol/L (ref 20.0–28.0)
Bicarbonate: 22.1 mmol/L (ref 20.0–28.0)
Calcium, Ion: 1.21 mmol/L (ref 1.15–1.40)
Calcium, Ion: 1.19 mmol/L (ref 1.15–1.40)
HCT: 36 % — ABNORMAL LOW (ref 39.0–52.0)
HCT: 28 % — ABNORMAL LOW (ref 39.0–52.0)
Hemoglobin: 12.2 g/dL — ABNORMAL LOW (ref 13.0–17.0)
Hemoglobin: 9.5 g/dL — ABNORMAL LOW (ref 13.0–17.0)
O2 Saturation: 100 %
O2 Saturation: 100 %
Patient temperature: 36
Patient temperature: 35
Potassium: 3.2 mmol/L — ABNORMAL LOW (ref 3.5–5.1)
Potassium: 3.4 mmol/L — ABNORMAL LOW (ref 3.5–5.1)
Sodium: 142 mmol/L (ref 135–145)
Sodium: 140 mmol/L (ref 135–145)
TCO2: 25 mmol/L (ref 22–32)
TCO2: 23 mmol/L (ref 22–32)
pCO2 arterial: 47.4 mmHg (ref 32.0–48.0)
pCO2 arterial: 43.8 mmHg (ref 32.0–48.0)
pH, Arterial: 7.299 — ABNORMAL LOW (ref 7.350–7.450)
pH, Arterial: 7.3 — ABNORMAL LOW (ref 7.350–7.450)
pO2, Arterial: 497 mmHg — ABNORMAL HIGH (ref 83.0–108.0)
pO2, Arterial: 507 mmHg — ABNORMAL HIGH (ref 83.0–108.0)

## 2019-04-28 LAB — COMPREHENSIVE METABOLIC PANEL
ALT: 34 U/L (ref 0–44)
AST: 91 U/L — ABNORMAL HIGH (ref 15–41)
Albumin: 2.5 g/dL — ABNORMAL LOW (ref 3.5–5.0)
Alkaline Phosphatase: 54 U/L (ref 38–126)
Anion gap: 9 (ref 5–15)
BUN: 8 mg/dL (ref 6–20)
CO2: 21 mmol/L — ABNORMAL LOW (ref 22–32)
Calcium: 8.1 mg/dL — ABNORMAL LOW (ref 8.9–10.3)
Chloride: 101 mmol/L (ref 98–111)
Creatinine, Ser: 0.78 mg/dL (ref 0.61–1.24)
GFR calc Af Amer: >60 mL/min (ref >60)
GFR calc non Af Amer: >60 mL/min (ref >60)
Glucose, Bld: 116 mg/dL — ABNORMAL HIGH (ref 70–99)
Potassium: 4 mmol/L (ref 3.5–5.1)
Sodium: 131 mmol/L — ABNORMAL LOW (ref 135–145)
Total Bilirubin: 2.1 mg/dL — ABNORMAL HIGH (ref 0.3–1.2)
Total Protein: 5.4 g/dL — ABNORMAL LOW (ref 6.5–8.1)

## 2019-04-28 LAB — MAGNESIUM
Magnesium: 2.2 mg/dL (ref 1.7–2.4)
Magnesium: 2.1 mg/dL (ref 1.7–2.4)

## 2019-04-28 LAB — TRIGLYCERIDES: Triglycerides: 175 mg/dL — ABNORMAL HIGH (ref <150)

## 2019-04-28 LAB — PHOSPHORUS: Phosphorus: 2.1 mg/dL — ABNORMAL LOW (ref 2.5–4.6)

## 2019-04-28 MED ORDER — LORAZEPAM 1 MG PO TABS
1.0000 mg | ORAL_TABLET | ORAL | Status: AC | PRN
  Administered 2019-04-28: 2 mg via ORAL
  Filled 2019-04-28: qty 2

## 2019-04-28 MED ORDER — FOLIC ACID 1 MG PO TABS
1.0000 mg | ORAL_TABLET | Freq: Every day | ORAL | Status: DC
  Administered 2019-04-28 – 2019-05-08 (×10): 1 mg via ORAL
  Filled 2019-04-28 (×11): qty 1

## 2019-04-28 MED ORDER — SPIRITUS FRUMENTI
1.0000 | ORAL | Status: DC | PRN
  Administered 2019-04-28 – 2019-05-03 (×15): 1 via ORAL
  Filled 2019-04-28 (×6): qty 1
  Filled 2019-04-28: qty 2
  Filled 2019-04-28 (×17): qty 1

## 2019-04-28 MED ORDER — LORAZEPAM 2 MG/ML IJ SOLN
1.0000 mg | INTRAMUSCULAR | Status: AC | PRN
  Administered 2019-04-28: 1 mg via INTRAVENOUS
  Administered 2019-04-28: 4 mg via INTRAVENOUS
  Administered 2019-04-28: 2 mg via INTRAVENOUS
  Administered 2019-04-28: 4 mg via INTRAVENOUS
  Administered 2019-04-28 – 2019-05-01 (×10): 2 mg via INTRAVENOUS
  Filled 2019-04-28 (×6): qty 1
  Filled 2019-04-28: qty 2
  Filled 2019-04-28 (×7): qty 1
  Filled 2019-04-28: qty 2

## 2019-04-28 MED ORDER — TRAMADOL HCL 50 MG PO TABS
50.0000 mg | ORAL_TABLET | Freq: Four times a day (QID) | ORAL | Status: DC
  Administered 2019-04-28 – 2019-04-30 (×9): 50 mg via ORAL
  Filled 2019-04-28 (×9): qty 1

## 2019-04-28 MED ORDER — PANTOPRAZOLE SODIUM 40 MG PO TBEC
40.0000 mg | DELAYED_RELEASE_TABLET | Freq: Every day | ORAL | Status: DC
  Administered 2019-04-28 – 2019-04-30 (×3): 40 mg via ORAL
  Filled 2019-04-28 (×4): qty 1

## 2019-04-28 MED ORDER — HALOPERIDOL LACTATE 5 MG/ML IJ SOLN
5.0000 mg | INTRAMUSCULAR | Status: DC | PRN
  Administered 2019-04-30 – 2019-05-07 (×8): 5 mg via INTRAVENOUS
  Filled 2019-04-28 (×9): qty 1

## 2019-04-28 MED ORDER — METHOCARBAMOL 1000 MG/10ML IJ SOLN
1000.0000 mg | Freq: Three times a day (TID) | INTRAVENOUS | Status: DC | PRN
  Administered 2019-04-28 – 2019-05-06 (×3): 1000 mg via INTRAVENOUS
  Filled 2019-04-28 (×5): qty 10

## 2019-04-28 MED ORDER — VITAMIN B-1 100 MG PO TABS
100.0000 mg | ORAL_TABLET | Freq: Every day | ORAL | Status: DC
  Administered 2019-04-28 – 2019-05-08 (×10): 100 mg via ORAL
  Filled 2019-04-28 (×11): qty 1

## 2019-04-28 MED ORDER — BOOST / RESOURCE BREEZE PO LIQD CUSTOM
1.0000 | Freq: Three times a day (TID) | ORAL | Status: DC
  Administered 2019-04-28 – 2019-04-30 (×6): 1 via ORAL

## 2019-04-28 MED ORDER — OXYCODONE HCL 5 MG PO TABS: 10.0000 mg | ORAL_TABLET | ORAL | Status: DC | PRN

## 2019-04-28 MED ORDER — PRO-STAT SUGAR FREE PO LIQD
30.0000 mL | Freq: Two times a day (BID) | ORAL | Status: DC
  Administered 2019-04-28 – 2019-05-03 (×10): 30 mL via ORAL
  Filled 2019-04-28 (×11): qty 30

## 2019-04-28 MED ORDER — THIAMINE HCL 100 MG/ML IJ SOLN
100.0000 mg | Freq: Every day | INTRAMUSCULAR | Status: DC
  Filled 2019-04-28 (×5): qty 2

## 2019-04-28 MED ORDER — ADULT MULTIVITAMIN W/MINERALS CH
1.0000 | ORAL_TABLET | Freq: Every day | ORAL | Status: DC
  Administered 2019-04-28 – 2019-05-08 (×10): 1 via ORAL
  Filled 2019-04-28 (×11): qty 1

## 2019-04-28 MED ORDER — ACETAMINOPHEN 325 MG PO TABS
650.0000 mg | ORAL_TABLET | Freq: Four times a day (QID) | ORAL | Status: DC
  Administered 2019-04-28 – 2019-04-30 (×9): 650 mg via ORAL
  Filled 2019-04-28 (×9): qty 2
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
## 2019-04-28 NOTE — Plan of Care (Signed)
  Problem: Education: Goal: Knowledge of General Education information will improve Description: Including pain rating scale, medication(s)/side effects and non-pharmacologic comfort measures Outcome: Progressing  Pt is alert and oriented most of the time, when reoriented and educated, pt verbalizes understanding.  Problem: Clinical Measurements: Goal: Will remain free from infection Outcome: Progressing Pt has not had a temperature throughout the night, minimal serosanguinous drainage noted from abdominal sites, less swelling. Goal: Respiratory complications will improve Outcome: Progressing  Pt's breathing is even and unlabored on room air. Pt's oxygen saturation decreases into the high 80s at times when resting with eyes closed, pt refuses to wear oxygen. Problem: Coping: Goal: Level of anxiety will decrease Outcome: Progressing  Pt was very restless and fidgety throughout the night, irritable, and anxious at times. Pt was redirected and reoriented throughout the night. Pt often reminded about importance of plan of care. Problem: Pain Managment: Goal: General experience of comfort will improve Outcome: Progressing Pt did receive one dose of pain medication (see MAR) and he denied pain thereafter. Pt refused pain medication when RN offered it due to discomfort when turning or coughing.

## 2019-04-28 NOTE — Progress Notes (Signed)
Wife called asking for another update on her husband. She was reassured and informed that husband has been intermittently agitated and confused.

## 2019-04-28 NOTE — Progress Notes (Signed)
Pt CIWA score increased from 5 to 25 after 1 hr and 1mg  of Ativan. MD notified and new order to follow.

## 2019-04-28 NOTE — Progress Notes (Signed)
Pt sat up on the edge of the bed screaming, "Get out of here! Answer me! Why aren't you answer me?!" No one else besides the pt was in the room. RN reassured and reoriented pt. Pt is alert to voice, time, place, and situation. Pt reports he is upset that his phone isn't working and that his wife doesn't answer. Pt attempted to stand up 3 different times. RN redirected pt and offered assistance. Pt stated, "I don't need your help!" RN called pt's wife. Pt was assisted back to bed, pt refused to be pulled up in the bed. IV lines are patent and running. Bed alarm is on, bed is in lowest position, and call bell is within reach. Pt verbalizes understanding of call bell use.

## 2019-04-29 ENCOUNTER — Inpatient Hospital Stay (HOSPITAL_COMMUNITY): Payer: Medicaid Other

## 2019-04-29 LAB — TYPE AND SCREEN
ABO/RH(D): O POS
Antibody Screen: NEGATIVE
Unit division: 0
Unit division: 0
Unit division: 0
Unit division: 0
Unit division: 0
Unit division: 0
Unit division: 0
Unit division: 0

## 2019-04-29 LAB — CBC
HCT: 23.9 % — ABNORMAL LOW (ref 39.0–52.0)
Hemoglobin: 8.3 g/dL — ABNORMAL LOW (ref 13.0–17.0)
MCH: 35.3 pg — ABNORMAL HIGH (ref 26.0–34.0)
MCHC: 34.7 g/dL (ref 30.0–36.0)
MCV: 101.7 fL — ABNORMAL HIGH (ref 80.0–100.0)
Platelets: 197 10*3/uL (ref 150–400)
RBC: 2.35 MIL/uL — ABNORMAL LOW (ref 4.22–5.81)
RDW: 11.9 % (ref 11.5–15.5)
WBC: 8.4 10*3/uL (ref 4.0–10.5)
nRBC: 0 % (ref 0.0–0.2)

## 2019-04-29 LAB — BPAM RBC
Blood Product Expiration Date: 202010262359
Blood Product Expiration Date: 202010272359
Blood Product Expiration Date: 202010272359
Blood Product Expiration Date: 202010272359
Blood Product Expiration Date: 202010272359
Blood Product Expiration Date: 202010282359
Blood Product Expiration Date: 202010282359
Blood Product Expiration Date: 202010282359
ISSUE DATE / TIME: 202009252136
ISSUE DATE / TIME: 202009252136
ISSUE DATE / TIME: 202009252136
ISSUE DATE / TIME: 202009252136
ISSUE DATE / TIME: 202009252211
ISSUE DATE / TIME: 202009260755
ISSUE DATE / TIME: 202009260755
ISSUE DATE / TIME: 202009260839
Unit Type and Rh: 5100
Unit Type and Rh: 5100
Unit Type and Rh: 5100
Unit Type and Rh: 5100
Unit Type and Rh: 5100
Unit Type and Rh: 5100
Unit Type and Rh: 5100
Unit Type and Rh: 5100

## 2019-04-29 LAB — BASIC METABOLIC PANEL
Anion gap: 12 (ref 5–15)
BUN: 7 mg/dL (ref 6–20)
CO2: 20 mmol/L — ABNORMAL LOW (ref 22–32)
Calcium: 8.1 mg/dL — ABNORMAL LOW (ref 8.9–10.3)
Chloride: 99 mmol/L (ref 98–111)
Creatinine, Ser: 0.72 mg/dL (ref 0.61–1.24)
GFR calc Af Amer: >60 mL/min (ref >60)
GFR calc non Af Amer: >60 mL/min (ref >60)
Glucose, Bld: 94 mg/dL (ref 70–99)
Potassium: 3.2 mmol/L — ABNORMAL LOW (ref 3.5–5.1)
Sodium: 131 mmol/L — ABNORMAL LOW (ref 135–145)

## 2019-04-29 LAB — URINALYSIS, ROUTINE W REFLEX MICROSCOPIC
Bilirubin Urine: NEGATIVE
Glucose, UA: NEGATIVE mg/dL
Ketones, ur: 20 mg/dL — AB
Leukocytes,Ua: NEGATIVE
Nitrite: NEGATIVE
Protein, ur: NEGATIVE mg/dL
Specific Gravity, Urine: 1.011 (ref 1.005–1.030)
pH: 5 (ref 5.0–8.0)

## 2019-04-29 LAB — TRIGLYCERIDES: Triglycerides: 122 mg/dL (ref <150)

## 2019-04-29 MED ORDER — METOPROLOL TARTRATE 12.5 MG HALF TABLET
12.5000 mg | ORAL_TABLET | Freq: Two times a day (BID) | ORAL | Status: DC
  Administered 2019-04-29 – 2019-05-08 (×18): 12.5 mg via ORAL
  Filled 2019-04-29 (×19): qty 1

## 2019-04-29 MED ORDER — PIPERACILLIN-TAZOBACTAM 3.375 G IVPB
3.3750 g | Freq: Three times a day (TID) | INTRAVENOUS | Status: DC
  Administered 2019-04-29 – 2019-05-03 (×13): 3.375 g via INTRAVENOUS
  Filled 2019-04-29 (×18): qty 50

## 2019-04-29 NOTE — Progress Notes (Signed)
OT Cancellation Note  Patient Details Name: Dylan Snyder MRN: [id number] DOB: November 25, 1970   Cancelled Treatment:    Reason Eval/Treat Not Completed: Medical issues which prohibited therapy; patient with high CIWA and fever.  RN advised to cancel for today. Will follow up for OT evaluation as schedule permits.  Lou Cal, OT [facility] Services Pager [phone] Office [phone]   Raymondo Band 04/29/2019, 2:01 PM
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
## 2019-04-29 NOTE — Plan of Care (Signed)
  Problem: Education: Goal: Knowledge of General Education information will improve Description: Including pain rating scale, medication(s)/side effects and non-pharmacologic comfort measures Outcome: Progressing   Problem: Health Behavior/Discharge Planning: Goal: Ability to manage health-related needs will improve Outcome: Progressing   Problem: Clinical Measurements: Goal: Ability to maintain clinical measurements within normal limits will improve Outcome: Progressing Goal: Will remain free from infection Outcome: Progressing Goal: Diagnostic test results will improve Outcome: Progressing Goal: Respiratory complications will improve Outcome: Progressing Goal: Cardiovascular complication will be avoided Outcome: Progressing   Problem: Activity: Goal: Risk for activity intolerance will decrease Outcome: Progressing   Problem: Nutrition: Goal: Adequate nutrition will be maintained Outcome: Progressing   Problem: Coping: Goal: Level of anxiety will decrease Outcome: Progressing   Problem: Elimination: Goal: Will not experience complications related to bowel motility Outcome: Progressing Goal: Will not experience complications related to urinary retention Outcome: Progressing   Problem: Pain Managment: Goal: General experience of comfort will improve Outcome: Progressing   Problem: Safety: Goal: Ability to remain free from injury will improve Outcome: Progressing   Problem: Skin Integrity: Goal: Risk for impaired skin integrity will decrease Outcome: Progressing   Problem: Education: Goal: Verbalization of understanding the information provided will improve Outcome: Progressing   Problem: Coping: Goal: Ability to demonstrate appropriate behavior when angry will improve Outcome: Progressing Goal: Ability to identify and develop effective coping behavior will improve Outcome: Progressing Goal: Ability to interact with others will improve Outcome: Progressing

## 2019-04-29 NOTE — Progress Notes (Signed)
All dressings changed, umbilical tap ribbon wick removed per MD order. Only one found at inferior portion of abdominal incision. Pt resting comfortably. Nursing will continue to monitor.
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
## 2019-04-29 NOTE — Progress Notes (Signed)
PT Cancellation Note  Patient Details Name: FARRIS GEIMAN MRN: [id number] DOB: 29-Nov-1970   Cancelled Treatment:    Reason Eval/Treat Not Completed: Medical issues which prohibited therapy; patient with high CIWA and fever.  RN advised to cancel for today.  Will attempt to see another day.   Reginia Naas 04/29/2019, 1:53 PM  Magda Kiel, [place] [phone] 04/29/2019

## 2019-04-30 LAB — COMPREHENSIVE METABOLIC PANEL
ALT: 34 U/L (ref 0–44)
AST: 66 U/L — ABNORMAL HIGH (ref 15–41)
Albumin: 2.3 g/dL — ABNORMAL LOW (ref 3.5–5.0)
Alkaline Phosphatase: 62 U/L (ref 38–126)
Anion gap: 12 (ref 5–15)
BUN: 6 mg/dL (ref 6–20)
CO2: 23 mmol/L (ref 22–32)
Calcium: 8.4 mg/dL — ABNORMAL LOW (ref 8.9–10.3)
Chloride: 98 mmol/L (ref 98–111)
Creatinine, Ser: 0.67 mg/dL (ref 0.61–1.24)
GFR calc Af Amer: >60 mL/min (ref >60)
GFR calc non Af Amer: >60 mL/min (ref >60)
Glucose, Bld: 92 mg/dL (ref 70–99)
Potassium: 3.2 mmol/L — ABNORMAL LOW (ref 3.5–5.1)
Sodium: 133 mmol/L — ABNORMAL LOW (ref 135–145)
Total Bilirubin: 1.6 mg/dL — ABNORMAL HIGH (ref 0.3–1.2)
Total Protein: 5.5 g/dL — ABNORMAL LOW (ref 6.5–8.1)

## 2019-04-30 LAB — CREATININE, FLUID (PLEURAL, PERITONEAL, JP DRAINAGE): Creat, Fluid: 0.8 mg/dL

## 2019-04-30 LAB — CBC
HCT: 24.6 % — ABNORMAL LOW (ref 39.0–52.0)
Hemoglobin: 8.8 g/dL — ABNORMAL LOW (ref 13.0–17.0)
MCH: 35.3 pg — ABNORMAL HIGH (ref 26.0–34.0)
MCHC: 35.8 g/dL (ref 30.0–36.0)
MCV: 98.8 fL (ref 80.0–100.0)
Platelets: 233 10*3/uL (ref 150–400)
RBC: 2.49 MIL/uL — ABNORMAL LOW (ref 4.22–5.81)
RDW: 11.8 % (ref 11.5–15.5)
WBC: 8.9 10*3/uL (ref 4.0–10.5)
nRBC: 0 % (ref 0.0–0.2)

## 2019-04-30 LAB — URINE CULTURE: Culture: NO GROWTH

## 2019-04-30 LAB — TRIGLYCERIDES: Triglycerides: 142 mg/dL (ref <150)

## 2019-04-30 MED ORDER — ACETAMINOPHEN 325 MG PO TABS
650.0000 mg | ORAL_TABLET | Freq: Four times a day (QID) | ORAL | Status: DC
  Administered 2019-04-30 – 2019-05-08 (×30): 650 mg via ORAL
  Filled 2019-04-30 (×31): qty 2

## 2019-04-30 MED ORDER — CHLORHEXIDINE GLUCONATE CLOTH 2 % EX PADS
6.0000 | MEDICATED_PAD | Freq: Every day | CUTANEOUS | Status: DC
  Administered 2019-05-02 – 2019-05-08 (×7): 6 via TOPICAL

## 2019-04-30 MED ORDER — POTASSIUM CHLORIDE CRYS ER 20 MEQ PO TBCR
20.0000 meq | EXTENDED_RELEASE_TABLET | Freq: Two times a day (BID) | ORAL | Status: DC
  Administered 2019-04-30 – 2019-05-08 (×15): 20 meq via ORAL
  Filled 2019-04-30 (×15): qty 1

## 2019-04-30 MED ORDER — DOCUSATE SODIUM 50 MG/5ML PO LIQD
100.0000 mg | Freq: Every day | ORAL | Status: DC
  Administered 2019-04-30 – 2019-05-03 (×3): 100 mg via ORAL
  Filled 2019-04-30 (×5): qty 10

## 2019-04-30 MED ORDER — HYDROCODONE-ACETAMINOPHEN 10-325 MG PO TABS: 1.0000 | ORAL_TABLET | Freq: Four times a day (QID) | ORAL | Status: DC | PRN

## 2019-04-30 MED ORDER — OXYCODONE HCL 5 MG PO TABS
5.0000 mg | ORAL_TABLET | ORAL | Status: DC | PRN
  Administered 2019-04-30 (×2): 10 mg via ORAL
  Administered 2019-05-02: 5 mg via ORAL
  Administered 2019-05-02: 10 mg via ORAL
  Administered 2019-05-03 – 2019-05-04 (×4): 5 mg via ORAL
  Administered 2019-05-05 – 2019-05-06 (×5): 10 mg via ORAL
  Administered 2019-05-07 (×2): 5 mg via ORAL
  Filled 2019-04-30: qty 1
  Filled 2019-04-30: qty 2
  Filled 2019-04-30: qty 1
  Filled 2019-04-30: qty 2
  Filled 2019-04-30 (×2): qty 1
  Filled 2019-04-30: qty 2
  Filled 2019-04-30: qty 1
  Filled 2019-04-30: qty 2
  Filled 2019-04-30: qty 1
  Filled 2019-04-30 (×2): qty 2
  Filled 2019-04-30: qty 1
  Filled 2019-04-30 (×2): qty 2

## 2019-04-30 MED ORDER — ACETAMINOPHEN 500 MG PO TABS: 500.0000 mg | ORAL_TABLET | Freq: Four times a day (QID) | ORAL | Status: DC

## 2019-04-30 NOTE — Progress Notes (Signed)
Rehab Admissions Coordinator Note:  Patient was screened by Cleatrice Burke for appropriateness for an Inpatient Acute Rehab Consult per OT recs.  At this time, we are recommending Inpatient Rehab consult. Please place order.  Cleatrice Burke RN MSN 04/30/2019, 1:54 PM  I can be reached at [phone].
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
## 2019-04-30 NOTE — Progress Notes (Signed)
Pt's wife asked to speak with provider, SW regarding patient's plan of care and that she had not been able to speak to anyone in a while. Discussed that I would bring it up on progression tomorrow and she was satisfied with this plan. Simmie Davies RN
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
## 2019-04-30 NOTE — Consult Note (Signed)
[facility] Nurse ostomy follow-up consult note Pt had colostomy surgery performed 9/25.  Surgical team following for assessment and plan of care of abd wound.  Stoma type/location:  Left abd with stoma; red and viable, flush with skin level, 1 1/2 inches Peristomal assessment:  Intact skin surrounding Output: no stool or flatus, small amt brown liquid in pouch  Education provided:  Applied 2 piece pouch and patient watched using a mirror. He was able to open and close velcro to empty. Reviewed pouching routines and ordering supplies. [facility] team will continue to follow for another teaching session when pt is stable and out of ICU. Supplies ordered to bedside. Enrolled patient in [place] program: Yes Julien Girt MSN, RN, [place], [place], [place]
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
## 2019-05-01 LAB — TYPE AND SCREEN
ABO/RH(D): O POS
Antibody Screen: NEGATIVE
Unit division: 0

## 2019-05-01 LAB — BASIC METABOLIC PANEL
Anion gap: 10 (ref 5–15)
BUN: 5 mg/dL — ABNORMAL LOW (ref 6–20)
CO2: 21 mmol/L — ABNORMAL LOW (ref 22–32)
Calcium: 7.5 mg/dL — ABNORMAL LOW (ref 8.9–10.3)
Chloride: 104 mmol/L (ref 98–111)
Creatinine, Ser: 0.61 mg/dL (ref 0.61–1.24)
GFR calc Af Amer: >60 mL/min (ref >60)
GFR calc non Af Amer: >60 mL/min (ref >60)
Glucose, Bld: 97 mg/dL (ref 70–99)
Potassium: 3.2 mmol/L — ABNORMAL LOW (ref 3.5–5.1)
Sodium: 135 mmol/L (ref 135–145)

## 2019-05-01 LAB — CBC
HCT: 19.6 % — ABNORMAL LOW (ref 39.0–52.0)
HCT: 25.6 % — ABNORMAL LOW (ref 39.0–52.0)
HCT: 25.7 % — ABNORMAL LOW (ref 39.0–52.0)
Hemoglobin: 6.7 g/dL — CL (ref 13.0–17.0)
Hemoglobin: 9 g/dL — ABNORMAL LOW (ref 13.0–17.0)
Hemoglobin: 8.8 g/dL — ABNORMAL LOW (ref 13.0–17.0)
MCH: 34.4 pg — ABNORMAL HIGH (ref 26.0–34.0)
MCH: 34.7 pg — ABNORMAL HIGH (ref 26.0–34.0)
MCH: 34.6 pg — ABNORMAL HIGH (ref 26.0–34.0)
MCHC: 34.2 g/dL (ref 30.0–36.0)
MCHC: 35.2 g/dL (ref 30.0–36.0)
MCHC: 34.2 g/dL (ref 30.0–36.0)
MCV: 100.5 fL — ABNORMAL HIGH (ref 80.0–100.0)
MCV: 98.8 fL (ref 80.0–100.0)
MCV: 101.2 fL — ABNORMAL HIGH (ref 80.0–100.0)
Platelets: 235 10*3/uL (ref 150–400)
Platelets: 340 10*3/uL (ref 150–400)
Platelets: 365 10*3/uL (ref 150–400)
RBC: 1.95 MIL/uL — ABNORMAL LOW (ref 4.22–5.81)
RBC: 2.59 MIL/uL — ABNORMAL LOW (ref 4.22–5.81)
RBC: 2.54 MIL/uL — ABNORMAL LOW (ref 4.22–5.81)
RDW: 12 % (ref 11.5–15.5)
RDW: 12 % (ref 11.5–15.5)
RDW: 12.2 % (ref 11.5–15.5)
WBC: 7.1 10*3/uL (ref 4.0–10.5)
WBC: 9.5 10*3/uL (ref 4.0–10.5)
WBC: 9.6 10*3/uL (ref 4.0–10.5)
nRBC: 0 % (ref 0.0–0.2)
nRBC: 0 % (ref 0.0–0.2)
nRBC: 0 % (ref 0.0–0.2)

## 2019-05-01 LAB — BPAM RBC
Blood Product Expiration Date: 202011032359
Unit Type and Rh: 5100

## 2019-05-01 LAB — PREPARE RBC (CROSSMATCH)

## 2019-05-01 LAB — AMMONIA: Ammonia: 12 umol/L (ref 9–35)

## 2019-05-01 LAB — TRIGLYCERIDES: Triglycerides: 114 mg/dL (ref <150)

## 2019-05-01 MED ORDER — POTASSIUM CHLORIDE CRYS ER 20 MEQ PO TBCR
40.0000 meq | EXTENDED_RELEASE_TABLET | Freq: Once | ORAL | Status: AC
  Administered 2019-05-01: 40 meq via ORAL
  Filled 2019-05-01: qty 2

## 2019-05-01 MED ORDER — ENSURE ENLIVE PO LIQD
237.0000 mL | Freq: Two times a day (BID) | ORAL | Status: DC
  Administered 2019-05-02 – 2019-05-08 (×13): 237 mL via ORAL

## 2019-05-01 MED ORDER — SODIUM CHLORIDE 0.9% IV SOLUTION
Freq: Once | INTRAVENOUS | Status: AC
  Administered 2019-05-01: 06:00:00 via INTRAVENOUS

## 2019-05-01 MED ORDER — LORAZEPAM 2 MG/ML IJ SOLN
1.0000 mg | INTRAMUSCULAR | Status: AC | PRN
  Administered 2019-05-01 – 2019-05-03 (×9): 2 mg via INTRAVENOUS
  Administered 2019-05-03: 4 mg via INTRAVENOUS
  Filled 2019-05-01 (×2): qty 1
  Filled 2019-05-01: qty 2
  Filled 2019-05-01 (×5): qty 1

## 2019-05-01 MED ORDER — CLONAZEPAM 1 MG PO TABS
1.0000 mg | ORAL_TABLET | Freq: Two times a day (BID) | ORAL | Status: DC
  Administered 2019-05-01 – 2019-05-08 (×15): 1 mg via ORAL
  Filled 2019-05-01 (×15): qty 1

## 2019-05-01 MED ORDER — LORAZEPAM 1 MG PO TABS
1.0000 mg | ORAL_TABLET | ORAL | Status: AC | PRN
  Administered 2019-05-03: 3 mg via ORAL
  Filled 2019-05-01: qty 3

## 2019-05-01 NOTE — Progress Notes (Signed)
Due to patient's withdraw and agitation blood transfusion was held till patient was more calm after Ativan and Spirits resumed. Pt tore off blood bank band so blood had to be redrawn for blood bank. In the meantime CBC was done by lab and HGB was 9.0 at 1533 versus 6.7 at 0341. Dr Grandville Silos notified and stated if repeat CBC values of HBG >7.0 --do not transfuse at this time. Awaiting results of repeat drawn to verify 9.0 value at 1533. Simmie Davies RN

## 2019-05-01 NOTE — Progress Notes (Signed)
PT Cancellation Note  Patient Details Name: Dylan Snyder MRN: [id number] DOB: November 04, 1970   Cancelled Treatment:    Reason Eval/Treat Not Completed: Other (comment) RN requesting PT defer due to agitation.  Ellamae Sia, PT, DPT Acute Rehabilitation Services Pager [phone] Office [phone]    Willy Eddy 05/01/2019, 12:52 PM

## 2019-05-01 NOTE — Progress Notes (Signed)
TRN note- Spoke with nursing staff on unit regarding patients continued agitation and pulling out IV lines etc,  she ask if patient could have safety sitter at night when patients wife leaves for the night.  Will order Air cabin crew.  [facility] Trauma Response Nurse
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
## 2019-05-01 NOTE — Progress Notes (Signed)
Patient had critical lab value of Hgb of 6.7, KCL of 3.8. MD  Notified. A unit of Red Packed Blood cell and potassium 13mEq one time prescribed. Blood transfusion consent not in chart. Patient's wife called, voice message left . Waiting for call back to obtain consent.

## 2019-05-01 NOTE — Progress Notes (Signed)
Patient pulled PIV.No witness to the removal of the IVs. IV Team consult initiated. Patient pulled the second IV that was inserted. Patient remains agitated, restless, not following commands.
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
## 2019-05-02 LAB — CBC
HCT: 24.9 % — ABNORMAL LOW (ref 39.0–52.0)
Hemoglobin: 8.4 g/dL — ABNORMAL LOW (ref 13.0–17.0)
MCH: 35 pg — ABNORMAL HIGH (ref 26.0–34.0)
MCHC: 33.7 g/dL (ref 30.0–36.0)
MCV: 103.8 fL — ABNORMAL HIGH (ref 80.0–100.0)
Platelets: 420 10*3/uL — ABNORMAL HIGH (ref 150–400)
RBC: 2.4 MIL/uL — ABNORMAL LOW (ref 4.22–5.81)
RDW: 12.2 % (ref 11.5–15.5)
WBC: 8.9 10*3/uL (ref 4.0–10.5)
nRBC: 0 % (ref 0.0–0.2)

## 2019-05-02 LAB — BASIC METABOLIC PANEL
Anion gap: 10 (ref 5–15)
BUN: 8 mg/dL (ref 6–20)
CO2: 24 mmol/L (ref 22–32)
Calcium: 8.7 mg/dL — ABNORMAL LOW (ref 8.9–10.3)
Chloride: 102 mmol/L (ref 98–111)
Creatinine, Ser: 0.6 mg/dL — ABNORMAL LOW (ref 0.61–1.24)
GFR calc Af Amer: >60 mL/min (ref >60)
GFR calc non Af Amer: >60 mL/min (ref >60)
Glucose, Bld: 88 mg/dL (ref 70–99)
Potassium: 3.4 mmol/L — ABNORMAL LOW (ref 3.5–5.1)
Sodium: 136 mmol/L (ref 135–145)

## 2019-05-02 LAB — TRIGLYCERIDES: Triglycerides: 136 mg/dL (ref <150)

## 2019-05-02 MED ORDER — ENOXAPARIN SODIUM 40 MG/0.4ML ~~LOC~~ SOLN: 40.0000 mg | SUBCUTANEOUS | Status: DC

## 2019-05-02 MED ORDER — PANTOPRAZOLE SODIUM 40 MG PO TBEC
40.0000 mg | DELAYED_RELEASE_TABLET | Freq: Two times a day (BID) | ORAL | Status: DC
  Administered 2019-05-02 – 2019-05-05 (×8): 40 mg via ORAL
  Filled 2019-05-02 (×9): qty 1

## 2019-05-02 MED ORDER — ENOXAPARIN SODIUM 40 MG/0.4ML ~~LOC~~ SOLN
40.0000 mg | SUBCUTANEOUS | Status: DC
  Administered 2019-05-02 – 2019-05-08 (×7): 40 mg via SUBCUTANEOUS
  Filled 2019-05-02 (×7): qty 0.4

## 2019-05-02 NOTE — Consult Note (Addendum)
[place] Nurse ostomy follow-up consultnote Pt had colostomy surgery performed 9/25. Surgical team following for assessment and plan of care ofabd wound.  Stoma type/location:Left abd with stoma; red and viable, flush with skin level, 1 1/2 inches Peristomal assessment:Intact skin surrounding Output: no stool or flatus, small amt brown liquid in pouch Education provided:Applied 2 piece pouch and patient watched using a mirror. He was groggy and did not appear to understand the process and did not participate. Reviewed pouching routines and ordering supplies.[place] team will continue to follow for another teaching session. Supplies ordered to bedside. Enrolled patient in [place] program: Yes, previously Julien Girt MSN, [place], [place], [place], [place]

## 2019-05-02 NOTE — Progress Notes (Signed)
Inpatient Rehab Admissions:  Inpatient Rehab Consult received.  I met with pt and his wife at the bedside for rehabilitation assessment. Discussed program details and expectations regarding participation, length of stay, and anticipated level of assist at DC. Pt's wife and pt interested in the program but the wife would like to see how pt can perform functionally once he has a clearer mind. She states she would like to see how he does over the weekend prior to making a decision. She did confirm caregiver assistance needed for a CIR stay.   Per wife they have been in contact with financial counselor but would still like to discuss their options and see how he does over the weekend.   Will follow up Monday.   Jhonnie Garner, OTR/L  Rehab Admissions Coordinator  [phone] 05/02/2019 3:12 PM

## 2019-05-02 NOTE — Progress Notes (Signed)
Pt sleeping. 
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
## 2019-05-03 ENCOUNTER — Inpatient Hospital Stay (HOSPITAL_COMMUNITY): Payer: Medicaid Other

## 2019-05-03 LAB — BASIC METABOLIC PANEL
Anion gap: 9 (ref 5–15)
BUN: 6 mg/dL (ref 6–20)
CO2: 24 mmol/L (ref 22–32)
Calcium: 8.8 mg/dL — ABNORMAL LOW (ref 8.9–10.3)
Chloride: 103 mmol/L (ref 98–111)
Creatinine, Ser: 0.61 mg/dL (ref 0.61–1.24)
GFR calc Af Amer: >60 mL/min (ref >60)
GFR calc non Af Amer: >60 mL/min (ref >60)
Glucose, Bld: 95 mg/dL (ref 70–99)
Potassium: 3.7 mmol/L (ref 3.5–5.1)
Sodium: 136 mmol/L (ref 135–145)

## 2019-05-03 LAB — CBC
HCT: 24.5 % — ABNORMAL LOW (ref 39.0–52.0)
Hemoglobin: 8.5 g/dL — ABNORMAL LOW (ref 13.0–17.0)
MCH: 35.3 pg — ABNORMAL HIGH (ref 26.0–34.0)
MCHC: 34.7 g/dL (ref 30.0–36.0)
MCV: 101.7 fL — ABNORMAL HIGH (ref 80.0–100.0)
Platelets: 551 10*3/uL — ABNORMAL HIGH (ref 150–400)
RBC: 2.41 MIL/uL — ABNORMAL LOW (ref 4.22–5.81)
RDW: 12.4 % (ref 11.5–15.5)
WBC: 12.3 10*3/uL — ABNORMAL HIGH (ref 4.0–10.5)
nRBC: 0 % (ref 0.0–0.2)

## 2019-05-03 LAB — TRIGLYCERIDES: Triglycerides: 155 mg/dL — ABNORMAL HIGH (ref <150)

## 2019-05-03 MED ORDER — QUETIAPINE FUMARATE 50 MG PO TABS
50.0000 mg | ORAL_TABLET | Freq: Every day | ORAL | Status: DC
  Administered 2019-05-03 – 2019-05-07 (×5): 50 mg via ORAL
  Filled 2019-05-03: qty 2
  Filled 2019-05-03: qty 1
  Filled 2019-05-03: qty 2
  Filled 2019-05-03: qty 1
  Filled 2019-05-03: qty 2

## 2019-05-03 MED ORDER — DEXMEDETOMIDINE HCL IN NACL 200 MCG/50ML IV SOLN
0.4000 ug/kg/h | INTRAVENOUS | Status: DC
  Administered 2019-05-03: 0.4 ug/kg/h via INTRAVENOUS
  Filled 2019-05-03 (×2): qty 50

## 2019-05-03 MED ORDER — DEXMEDETOMIDINE HCL IN NACL 400 MCG/100ML IV SOLN
0.4000 ug/kg/h | INTRAVENOUS | Status: DC
  Administered 2019-05-03: 0.5 ug/kg/h via INTRAVENOUS
  Filled 2019-05-03 (×2): qty 100

## 2019-05-03 MED ORDER — CHLORDIAZEPOXIDE HCL 25 MG PO CAPS
25.0000 mg | ORAL_CAPSULE | Freq: Four times a day (QID) | ORAL | Status: DC | PRN
  Administered 2019-05-03 – 2019-05-06 (×6): 25 mg via ORAL
  Filled 2019-05-03 (×5): qty 5
  Filled 2019-05-03: qty 1

## 2019-05-03 MED ORDER — PIPERACILLIN-TAZOBACTAM 3.375 G IVPB
3.3750 g | Freq: Three times a day (TID) | INTRAVENOUS | Status: DC
  Administered 2019-05-04 – 2019-05-07 (×10): 3.375 g via INTRAVENOUS
  Filled 2019-05-03 (×10): qty 50

## 2019-05-03 NOTE — Plan of Care (Signed)
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
## 2019-05-03 NOTE — Progress Notes (Signed)
Pt is hallucinating, yelling out, pulling at lines, being combative. Pt stated he is seeing his grandchildren sitting in the room; he also use very foul language against the nurses ( calling them bitches). The pt spitted at the primary RN for today. He removed colostomy bag and place stool everywhere. He later c/o pain to abdomen, noted to be very anxious and wanting to go home, much agitation observed. CIWA score 35, Pt medicated. MD notified, Rapid Notified.

## 2019-05-04 LAB — CULTURE, BLOOD (ROUTINE X 2)
Culture: NO GROWTH
Culture: NO GROWTH
Special Requests: ADEQUATE
Special Requests: ADEQUATE

## 2019-05-04 MED ORDER — LORAZEPAM 1 MG PO TABS
1.0000 mg | ORAL_TABLET | ORAL | Status: AC | PRN
  Administered 2019-05-04: 2 mg via ORAL
  Filled 2019-05-04: qty 2

## 2019-05-04 MED ORDER — LORAZEPAM 2 MG/ML IJ SOLN
1.0000 mg | INTRAMUSCULAR | Status: AC | PRN
  Administered 2019-05-04 – 2019-05-05 (×2): 1 mg via INTRAVENOUS
  Administered 2019-05-06 (×2): 2 mg via INTRAVENOUS
  Filled 2019-05-04 (×3): qty 1

## 2019-05-04 MED ORDER — DOCUSATE SODIUM 100 MG PO CAPS
100.0000 mg | ORAL_CAPSULE | Freq: Every day | ORAL | Status: DC
  Administered 2019-05-04 – 2019-05-08 (×5): 100 mg via ORAL
  Filled 2019-05-04 (×5): qty 1
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
## 2019-05-04 NOTE — Plan of Care (Addendum)
Patient withdrawal symptoms controlled with prn medications, tolerated being off precedex gtt during the day. Capable of standing and pivoting to chair with front-wheel walker. OOB to chair for 7.5 hours and repositioned in chair independently, as needed.  Patient had interest in learning and assisted with emptying his colostomy bag.

## 2019-05-05 LAB — TYPE AND SCREEN
ABO/RH(D): O POS
Antibody Screen: NEGATIVE
Unit division: 0

## 2019-05-05 LAB — BASIC METABOLIC PANEL
Anion gap: 9 (ref 5–15)
BUN: 8 mg/dL (ref 6–20)
CO2: 24 mmol/L (ref 22–32)
Calcium: 9 mg/dL (ref 8.9–10.3)
Chloride: 104 mmol/L (ref 98–111)
Creatinine, Ser: 0.74 mg/dL (ref 0.61–1.24)
GFR calc Af Amer: >60 mL/min (ref >60)
GFR calc non Af Amer: >60 mL/min (ref >60)
Glucose, Bld: 106 mg/dL — ABNORMAL HIGH (ref 70–99)
Potassium: 4.2 mmol/L (ref 3.5–5.1)
Sodium: 137 mmol/L (ref 135–145)

## 2019-05-05 LAB — CBC
HCT: 25.4 % — ABNORMAL LOW (ref 39.0–52.0)
Hemoglobin: 8.6 g/dL — ABNORMAL LOW (ref 13.0–17.0)
MCH: 34.5 pg — ABNORMAL HIGH (ref 26.0–34.0)
MCHC: 33.9 g/dL (ref 30.0–36.0)
MCV: 102 fL — ABNORMAL HIGH (ref 80.0–100.0)
Platelets: 798 10*3/uL — ABNORMAL HIGH (ref 150–400)
RBC: 2.49 MIL/uL — ABNORMAL LOW (ref 4.22–5.81)
RDW: 12.4 % (ref 11.5–15.5)
WBC: 14.5 10*3/uL — ABNORMAL HIGH (ref 4.0–10.5)
nRBC: 0 % (ref 0.0–0.2)

## 2019-05-05 LAB — BPAM RBC
Blood Product Expiration Date: 202011032359
Unit Type and Rh: 5100

## 2019-05-05 NOTE — Progress Notes (Signed)
Inpatient Rehabilitation-Admissions Coordinator   Met with pt and wife bedside. Noted pt progress with ambulation today. Anticipate pt will continue to progress quickly. Wife would like another day to think about CIR as she is weighing her options regarding potential cost of CIR vs home.   Will continue to follow.   Jhonnie Garner, OTR/L  Rehab Admissions Coordinator  [phone] 05/05/2019 1:57 PM

## 2019-05-05 NOTE — Consult Note (Signed)
[place] Nurse ostomy follow up Stoma type/location: LLQ, end colostomy Stomal assessment/size: 1 1/2" round, pink, flush with skin  Peristomal assessment: intact  Treatment options for stomal/peristomal skin: 2" skin barrier  Output  Ostomy pouching: 2pc. 2 3/4"  Education provided:  Explained stoma characteristics (budded, flush, color, texture, care) Demonstrated pouch change (cutting new skin barrier, measuring stoma, cleaning peristomal skin and stoma, use of barrier ring). Patient able to cut new skin barrier Education on emptying when 1/3 to 1/2 full and how to empty. Patient has emptied pouch with assistance          Enrolled patient in [place] Start Discharge program: Yes  [place] Nurse will follow along with you for continued support with ostomy teaching and care [place] MSN, [place], [place], [place], [place]
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
## 2019-05-06 MED ORDER — SODIUM CHLORIDE 0.9% FLUSH: 10.0000 mL | INTRAVENOUS | Status: DC | PRN

## 2019-05-06 MED ORDER — MORPHINE SULFATE (PF) 2 MG/ML IV SOLN: 2.0000 mg | Freq: Three times a day (TID) | INTRAVENOUS | Status: DC | PRN

## 2019-05-06 MED ORDER — NICOTINE 7 MG/24HR TD PT24
7.0000 mg | MEDICATED_PATCH | Freq: Every day | TRANSDERMAL | Status: DC
  Administered 2019-05-06 – 2019-05-08 (×3): 7 mg via TRANSDERMAL
  Filled 2019-05-06 (×3): qty 1
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
## 2019-05-06 NOTE — Progress Notes (Signed)
Pt. More frequently confused consistently over night. Insisted on getting up out of bed several times and pulling on his tubes/drains. RN educated several times and redirected as well as gave PRN medications. Pt. Did not sleep more than one hour despite scheduled and prn medications given. Patient states that he wants to "smoke a cigarette" and go home. RN will continue to monitor.

## 2019-05-07 LAB — BASIC METABOLIC PANEL
Anion gap: 10 (ref 5–15)
BUN: 9 mg/dL (ref 6–20)
CO2: 23 mmol/L (ref 22–32)
Calcium: 9.6 mg/dL (ref 8.9–10.3)
Chloride: 101 mmol/L (ref 98–111)
Creatinine, Ser: 0.67 mg/dL (ref 0.61–1.24)
GFR calc Af Amer: >60 mL/min (ref >60)
GFR calc non Af Amer: >60 mL/min (ref >60)
Glucose, Bld: 96 mg/dL (ref 70–99)
Potassium: 4.5 mmol/L (ref 3.5–5.1)
Sodium: 134 mmol/L — ABNORMAL LOW (ref 135–145)

## 2019-05-07 LAB — CBC
HCT: 27.3 % — ABNORMAL LOW (ref 39.0–52.0)
Hemoglobin: 9.1 g/dL — ABNORMAL LOW (ref 13.0–17.0)
MCH: 34.6 pg — ABNORMAL HIGH (ref 26.0–34.0)
MCHC: 33.3 g/dL (ref 30.0–36.0)
MCV: 103.8 fL — ABNORMAL HIGH (ref 80.0–100.0)
Platelets: 957 10*3/uL (ref 150–400)
RBC: 2.63 MIL/uL — ABNORMAL LOW (ref 4.22–5.81)
RDW: 12.8 % (ref 11.5–15.5)
WBC: 12.2 10*3/uL — ABNORMAL HIGH (ref 4.0–10.5)
nRBC: 0 % (ref 0.0–0.2)

## 2019-05-07 MED ORDER — LORAZEPAM 2 MG/ML IJ SOLN: 1.0000 mg | INTRAMUSCULAR | Status: DC | PRN

## 2019-05-07 MED ORDER — METHOCARBAMOL 500 MG PO TABS: 500.0000 mg | ORAL_TABLET | Freq: Three times a day (TID) | ORAL | Status: DC | PRN

## 2019-05-07 MED ORDER — LORAZEPAM 1 MG PO TABS: 1.0000 mg | ORAL_TABLET | ORAL | Status: DC | PRN
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
## 2019-05-07 NOTE — Progress Notes (Signed)
Inpatient Rehabilitation-Admissions Coordinator   Spoke with PT this afternoon who had just completed treatment with this patient. Recommendations are being upgraded to Home with [place]. Per PT, pt is supervision for ambulation at this time. With current functional status, pt is too high level for CIR. Pt and wife feel comfortable with a return home with [facility] services. I will sign off at this time and have communicated need for new dispo plans with CM.   Please call if questions.   Jhonnie Garner, OTR/L  Rehab Admissions Coordinator  [phone] 05/07/2019 3:33 PM
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
## 2019-05-07 NOTE — Progress Notes (Signed)
Date and time results received: 05/07/19 5:46 AM (use smartphrase ".now" to insert current time)  Test:Platelets  Critical Value: 957  Name of Provider Notified: Dr. Georgette Dover  Orders Received? Or Actions Taken?: Dr. Georgette Dover will let Dr. Grandville Silos know about elevated platelets.

## 2019-05-08 LAB — CBC
HCT: 29.9 % — ABNORMAL LOW (ref 39.0–52.0)
Hemoglobin: 10.1 g/dL — ABNORMAL LOW (ref 13.0–17.0)
MCH: 34.4 pg — ABNORMAL HIGH (ref 26.0–34.0)
MCHC: 33.8 g/dL (ref 30.0–36.0)
MCV: 101.7 fL — ABNORMAL HIGH (ref 80.0–100.0)
Platelets: 1134 10*3/uL (ref 150–400)
RBC: 2.94 MIL/uL — ABNORMAL LOW (ref 4.22–5.81)
RDW: 12.9 % (ref 11.5–15.5)
WBC: 13.3 10*3/uL — ABNORMAL HIGH (ref 4.0–10.5)
nRBC: 0 % (ref 0.0–0.2)

## 2019-05-08 LAB — PATHOLOGIST SMEAR REVIEW

## 2019-05-08 MED ORDER — ACETAMINOPHEN 325 MG PO TABS: 650.0000 mg | ORAL_TABLET | ORAL | Status: DC | PRN

## 2019-05-08 MED ORDER — METHOCARBAMOL 500 MG PO TABS: 500.0000 mg | ORAL_TABLET | Freq: Three times a day (TID) | ORAL | 0 refills | Status: DC | PRN

## 2019-05-08 MED ORDER — METOPROLOL TARTRATE 25 MG PO TABS: 12.5000 mg | ORAL_TABLET | Freq: Two times a day (BID) | ORAL | 0 refills | Status: DC

## 2019-05-08 MED ORDER — OXYCODONE HCL 5 MG PO TABS: 5.0000 mg | ORAL_TABLET | ORAL | 0 refills | Status: DC | PRN

## 2019-05-08 MED FILL — METHOCARBAMOL 500 MG TABS: 500 | 7 days supply | Qty: 20 | Fill #0

## 2019-05-08 MED FILL — METOPROLOL TARTRATE 25 MG T: 25 | 30 days supply | Qty: 30 | Fill #0

## 2019-05-08 MED FILL — oxyCODONE HCL 5 MG TABS: 5 | 2 days supply | Qty: 20 | Fill #0
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
## 2019-05-08 NOTE — Progress Notes (Signed)
CRITICAL VALUE ALERT  Critical Value:  Platelets = 1,134  Date & Time Notied:  Notified 05/08/2019 08:30  Provider Notified: Provider on call  Orders Received/Actions taken: No new orders at this time, MD paged

## 2019-05-08 NOTE — Plan of Care (Signed)
  Problem: Health Behavior/Discharge Planning: Goal: Ability to manage health-related needs will improve Outcome: Adequate for Discharge Note: Received discharge order for patient. Discharge instructions reviewed with patient and his wife, including new medication/ prescriptions,  follow-up appointment, wound care, and foley care. At this time patient have stated understanding of discharge instructions. Patient and wife with no further questions at this time. Pt stable in no acute distress. Midline removed, intact. Nursing care complete.
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
## 2019-05-22 ENCOUNTER — Telehealth: Payer: Self-pay | Admitting: Orthopaedic Surgery

## 2019-05-22 NOTE — Telephone Encounter (Signed)
Ok to wait until 11/3

## 2019-05-22 NOTE — Telephone Encounter (Signed)
Patient's wife called to set an appointment with Dr. Erlinda Hong for a hospital f/u.  She stated that he is having difficulty walking around without the walker and thought that Dr. Erlinda Hong may want to see him earlier than Nov 3rd.  CB#[phone].  Thank you.

## 2019-05-22 NOTE — Telephone Encounter (Signed)
See message.

## 2019-06-03 ENCOUNTER — Encounter: Payer: Self-pay | Admitting: Orthopaedic Surgery

## 2019-06-03 ENCOUNTER — Ambulatory Visit (INDEPENDENT_AMBULATORY_CARE_PROVIDER_SITE_OTHER): Payer: Self-pay

## 2019-06-03 ENCOUNTER — Ambulatory Visit: Payer: Self-pay | Admitting: Orthopaedic Surgery

## 2019-06-03 ENCOUNTER — Other Ambulatory Visit: Payer: Self-pay

## 2019-06-03 DIAGNOSIS — R102 Pelvic and perineal pain: Secondary | ICD-10-CM
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
## 2019-06-04 NOTE — Addendum Note (Signed)
Addended by: Precious Bard on: 06/04/2019 09:09 AM   Modules accepted: Orders

## 2019-06-05 ENCOUNTER — Encounter (HOSPITAL_COMMUNITY): Payer: Self-pay

## 2019-06-05 ENCOUNTER — Emergency Department (HOSPITAL_COMMUNITY)
Admission: EM | Admit: 2019-06-05 | Discharge: 2019-06-05 | Disposition: A | Discharge status: Home / Self Care | Payer: Self-pay | Attending: Emergency Medicine | Admitting: Emergency Medicine

## 2019-06-05 ENCOUNTER — Telehealth: Payer: Self-pay | Admitting: Orthopaedic Surgery

## 2019-06-05 ENCOUNTER — Ambulatory Visit (HOSPITAL_COMMUNITY)
Admission: RE | Admit: 2019-06-05 | Discharge: 2019-06-05 | Disposition: A | Discharge status: Home / Self Care | Payer: Self-pay | Source: Ambulatory Visit | Attending: Physician Assistant | Admitting: Physician Assistant

## 2019-06-05 ENCOUNTER — Other Ambulatory Visit: Payer: Self-pay

## 2019-06-05 DIAGNOSIS — I10 Essential (primary) hypertension: Secondary | ICD-10-CM | POA: Insufficient documentation

## 2019-06-05 DIAGNOSIS — I824Y1 Acute embolism and thrombosis of unspecified deep veins of right proximal lower extremity: Secondary | ICD-10-CM | POA: Insufficient documentation

## 2019-06-05 DIAGNOSIS — Z87891 Personal history of nicotine dependence: Secondary | ICD-10-CM | POA: Insufficient documentation

## 2019-06-05 DIAGNOSIS — Z79899 Other long term (current) drug therapy: Secondary | ICD-10-CM | POA: Insufficient documentation

## 2019-06-05 DIAGNOSIS — R102 Pelvic and perineal pain: Secondary | ICD-10-CM | POA: Insufficient documentation

## 2019-06-05 LAB — CBC
HCT: 40.1 % (ref 39.0–52.0)
Hemoglobin: 12.3 g/dL — ABNORMAL LOW (ref 13.0–17.0)
MCH: 30.7 pg (ref 26.0–34.0)
MCHC: 30.7 g/dL (ref 30.0–36.0)
MCV: 100 fL (ref 80.0–100.0)
Platelets: 497 10*3/uL — ABNORMAL HIGH (ref 150–400)
RBC: 4.01 MIL/uL — ABNORMAL LOW (ref 4.22–5.81)
RDW: 14.6 % (ref 11.5–15.5)
WBC: 7.9 10*3/uL (ref 4.0–10.5)
nRBC: 0 % (ref 0.0–0.2)

## 2019-06-05 LAB — BASIC METABOLIC PANEL
Anion gap: 11 (ref 5–15)
BUN: 5 mg/dL — ABNORMAL LOW (ref 6–20)
CO2: 26 mmol/L (ref 22–32)
Calcium: 9.3 mg/dL (ref 8.9–10.3)
Chloride: 101 mmol/L (ref 98–111)
Creatinine, Ser: 0.68 mg/dL (ref 0.61–1.24)
GFR calc Af Amer: >60 mL/min (ref >60)
GFR calc non Af Amer: >60 mL/min (ref >60)
Glucose, Bld: 109 mg/dL — ABNORMAL HIGH (ref 70–99)
Potassium: 4.2 mmol/L (ref 3.5–5.1)
Sodium: 138 mmol/L (ref 135–145)

## 2019-06-05 LAB — PROTIME-INR
INR: 1.1 (ref 0.8–1.2)
Prothrombin Time: 14.2 s (ref 11.4–15.2)

## 2019-06-05 MED ORDER — RIVAROXABAN 20 MG PO TABS
20.0000 mg | ORAL_TABLET | ORAL | Status: AC
  Administered 2019-06-05: 20 mg via ORAL
  Filled 2019-06-05: qty 1

## 2019-06-05 MED ORDER — RIVAROXABAN (XARELTO) EDUCATION KIT FOR DVT/PE PATIENTS
PACK | Freq: Once | Status: AC
  Administered 2019-06-05: 13:00:00
  Filled 2019-06-05: qty 1

## 2019-06-05 MED ORDER — RIVAROXABAN (XARELTO) VTE STARTER PACK (15 & 20 MG): ORAL_TABLET | ORAL | 0 refills | Status: DC

## 2019-06-05 NOTE — Telephone Encounter (Signed)
See message below °

## 2019-06-05 NOTE — Progress Notes (Signed)
Right lower extremity venous duplex completed. Refer to "CV Proc" under chart review to view preliminary results.  Critical results discussed with Tiney Rouge, PA-C.  06/05/2019 11:07 AM Kelby Aline., MHA, RVT, RDCS, RDMS

## 2019-06-05 NOTE — Telephone Encounter (Signed)
Patient left a voicemail message stating that he did have a blood clot in his leg and that they put him on Xarelto , but he is wanting something for pain.  CB#[phone].  Thank you.

## 2019-06-05 NOTE — Progress Notes (Signed)
Thank you for sending him urgently to the ED for me
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
## 2019-06-05 NOTE — Discharge Instructions (Addendum)

## 2019-06-05 NOTE — Progress Notes (Signed)
ANTICOAGULATION CONSULT NOTE - Initial Consult  Pharmacy Consult for xarelto Indication: DVT  No Known Allergies  Patient Measurements: Height: 6\' 2"  (188 cm) IBW/kg (Calculated) : 82.2   Vital Signs: Temp: 98.1 F (36.7 C) (11/05 1058) Temp Source: Oral (11/05 1058) BP: 149/92 (11/05 1058) Pulse Rate: 109 (11/05 1058)  Labs: No results for input(s): HGB, HCT, PLT, APTT, LABPROT, INR, HEPARINUNFRC, HEPRLOWMOCWT, CREATININE, CKTOTAL, CKMB, TROPONINIHS in the last 72 hours.  CrCl cannot be calculated (Patient's most recent lab result is older than the maximum 21 days allowed.).   Medical History: Past Medical History:  Diagnosis Date  . Hypertension      Assessment: 48 yo male with confirmed DVT.  Pharmacy consulted to dose xarelto  Goal of Therapy:  xarelto per indication and renal function   Plan:  xarelto 15mg  po twice daily for 21 days then  xarelto 20mg  po once daily with evening meal Pharmacy to provide education  Dolly Rias RPh 06/05/2019, 12:36 PM

## 2019-06-05 NOTE — ED Triage Notes (Signed)
Pt presents with c/o DVT in his right leg. Pt reports he had an [place] today because he had swelling and pain in that leg and has a confirmed DVT.

## 2019-06-06 ENCOUNTER — Other Ambulatory Visit: Payer: Self-pay | Admitting: Physician Assistant

## 2019-06-06 MED ORDER — TRAMADOL HCL 50 MG PO TABS: 50.0000 mg | ORAL_TABLET | Freq: Three times a day (TID) | ORAL | 0 refills | Status: DC | PRN

## 2019-06-06 MED ORDER — TRAMADOL HCL 50 MG PO TABS: 50.0000 mg | ORAL_TABLET | Freq: Four times a day (QID) | ORAL | 1 refills | Status: DC | PRN

## 2019-06-06 NOTE — Telephone Encounter (Signed)
I spoke to patients wife.  He was referred to community health and wellness.  I discussed with her the need to give them a call to try and get in asap to manage his dvt.   In the meantime, are we able to refer someone like this to vascular to manage dvt?  He has been instructed to go to ED should he develop cp/sob.

## 2019-06-06 NOTE — Telephone Encounter (Signed)
Where does he need to go?

## 2019-06-06 NOTE — Telephone Encounter (Signed)
I sent in tramadol.  Can you make sure he keeps his appointment given by ED with the clinic that will manage his dvt/xarelto?  This is very important

## 2019-06-06 NOTE — Telephone Encounter (Signed)
Spoke with patient and relayed information below.

## 2019-06-06 NOTE — Telephone Encounter (Signed)
Pt called in requesting a call back in regards to your message this morning   [phone]

## 2019-06-10 ENCOUNTER — Other Ambulatory Visit: Payer: Self-pay

## 2019-06-10 DIAGNOSIS — I87331 Chronic venous hypertension (idiopathic) with ulcer and inflammation of right lower extremity: Secondary | ICD-10-CM

## 2019-06-10 DIAGNOSIS — L97919 Non-pressure chronic ulcer of unspecified part of right lower leg with unspecified severity: Secondary | ICD-10-CM

## 2019-06-10 DIAGNOSIS — M79604 Pain in right leg: Secondary | ICD-10-CM

## 2019-06-10 NOTE — Telephone Encounter (Signed)
ORDER MADE TO VASCULAR
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
## 2019-06-18 ENCOUNTER — Ambulatory Visit: Payer: Self-pay | Attending: Critical Care Medicine | Admitting: Critical Care Medicine

## 2019-06-18 ENCOUNTER — Encounter: Payer: Self-pay | Admitting: Critical Care Medicine

## 2019-06-18 ENCOUNTER — Ambulatory Visit: Payer: Self-pay | Attending: Family Medicine | Admitting: Licensed Clinical Social Worker

## 2019-06-18 ENCOUNTER — Other Ambulatory Visit: Payer: Self-pay

## 2019-06-18 VITALS — BP 147/97 | HR 118 | Temp 98.3°F | Ht 74.0 in | Wt 187.0 lb

## 2019-06-18 DIAGNOSIS — W3400XA Accidental discharge from unspecified firearms or gun, initial encounter: Secondary | ICD-10-CM

## 2019-06-18 DIAGNOSIS — Z789 Other specified health status: Secondary | ICD-10-CM

## 2019-06-18 DIAGNOSIS — F17200 Nicotine dependence, unspecified, uncomplicated: Secondary | ICD-10-CM

## 2019-06-18 DIAGNOSIS — F1090 Alcohol use, unspecified, uncomplicated: Secondary | ICD-10-CM

## 2019-06-18 DIAGNOSIS — T07XXXA Unspecified multiple injuries, initial encounter: Secondary | ICD-10-CM

## 2019-06-18 DIAGNOSIS — I82501 Chronic embolism and thrombosis of unspecified deep veins of right lower extremity: Secondary | ICD-10-CM | POA: Insufficient documentation

## 2019-06-18 DIAGNOSIS — Z933 Colostomy status: Secondary | ICD-10-CM

## 2019-06-18 DIAGNOSIS — Z7289 Other problems related to lifestyle: Secondary | ICD-10-CM | POA: Insufficient documentation

## 2019-06-18 DIAGNOSIS — F419 Anxiety disorder, unspecified: Secondary | ICD-10-CM

## 2019-06-18 DIAGNOSIS — I82411 Acute embolism and thrombosis of right femoral vein: Secondary | ICD-10-CM

## 2019-06-18 DIAGNOSIS — Y249XXA Unspecified firearm discharge, undetermined intent, initial encounter: Secondary | ICD-10-CM

## 2019-06-18 DIAGNOSIS — S3730XD Unspecified injury of urethra, subsequent encounter: Secondary | ICD-10-CM

## 2019-06-18 DIAGNOSIS — Z9359 Other cystostomy status: Secondary | ICD-10-CM

## 2019-06-18 DIAGNOSIS — S3660XS Unspecified injury of rectum, sequela: Secondary | ICD-10-CM

## 2019-06-18 MED ORDER — RIVAROXABAN 20 MG PO TABS: 20.0000 mg | ORAL_TABLET | Freq: Every day | ORAL | 4 refills | Status: DC

## 2019-06-18 MED ORDER — METOPROLOL TARTRATE 25 MG PO TABS: 12.5000 mg | ORAL_TABLET | Freq: Two times a day (BID) | ORAL | 3 refills | Status: DC

## 2019-06-18 MED ORDER — NICOTINE POLACRILEX 4 MG MT LOZG: LOZENGE | OROMUCOSAL | 4 refills | Status: DC

## 2019-06-18 MED ORDER — METHOCARBAMOL 500 MG PO TABS: 500.0000 mg | ORAL_TABLET | Freq: Three times a day (TID) | ORAL | 0 refills | Status: DC | PRN

## 2019-06-18 MED FILL — SM NICOTINE 4 MG LOZENGE: 4 | 24 days supply | Qty: 72 | Fill #0

## 2019-06-18 MED FILL — METHOCARBAMOL 500 MG TABS: 500 | 7 days supply | Qty: 20 | Fill #0

## 2019-06-18 MED FILL — XARELTO 20 MG TABLET: 20 | 30 days supply | Qty: 30 | Fill #0

## 2019-06-18 MED FILL — METOPROLOL TARTRATE 25 MG T: 25 | 30 days supply | Qty: 30 | Fill #0
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
## 2019-06-18 NOTE — Assessment & Plan Note (Signed)
Per licensed clinical social worker

## 2019-06-18 NOTE — Progress Notes (Signed)
Pt. Is here for HFU.  

## 2019-06-18 NOTE — Assessment & Plan Note (Signed)
Colostomy currently in place follow-up per general surgery

## 2019-06-18 NOTE — Assessment & Plan Note (Signed)
Ongoing alcohol use with significant anxiety disorder underlying  I made a warm handoff in connection with our licensed clinical social worker today for behavioral therapy, as well we will also plan for the social worker to connect this patient to alcohol counseling

## 2019-06-18 NOTE — Assessment & Plan Note (Signed)
Spent time on smoking cessation counseling today recommended nicotine replacement therapy     . Current smoking consumption amount: 1 pack a day  . Dicsussion on advise to quit smoking and smoking impacts: Impact on healing of the wounds discussed  . Patient's willingness to quit: Patient seems interested in quitting  . Methods to quit smoking discussed: Discussed anxiety control measures with Education officer, museum  . Medication management of smoking session drugs discussed: Recommend nicotine replacement therapy  . Resources provided:  AVS   . Setting quit date 2 months from now  . Follow-up arranged per next visit   Time spent counseling the patient: 10 minutes
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
## 2019-06-18 NOTE — Assessment & Plan Note (Signed)
Right lower quadrant suprapubic catheter in place currently
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
## 2019-06-18 NOTE — Patient Instructions (Addendum)
Continue Xarelto and when the starter pack is complete stay on 20 mg daily we will get you patient assistance  No change in your other medications and refills on all the medicines were sent to our pharmacy  Focus on smoking cessation and we will send a prescription for nicotine lozenge to our pharmacy to take 3 times daily  Our clinical social worker Jenel Lucks will connect with you as well regarding alcohol use  Return to see Dr. Delford Field in 1 month  Repeat venous Doppler of the right lower extremity was scheduled for the week of November 30  Financial assistance packet given and you need to follow up with Mikle Bosworth for financial assistance CARFA letter, orange and blue card   Tobacco Use Disorder Tobacco use disorder (TUD) occurs when a person craves, seeks, and uses tobacco, regardless of the consequences. This disorder can cause problems with mental and physical health. It can affect your ability to have healthy relationships, and it can keep you from meeting your responsibilities at work, home, or school. Tobacco may be:  Smoked as a cigarette or cigar.  Inhaled using e-cigarettes.  Smoked in a pipe or hookah.  Chewed as smokeless tobacco.  Inhaled into the nostrils as snuff. Tobacco products contain a dangerous chemical called nicotine, which is very addictive. Nicotine triggers hormones that make the body feel stimulated and works on areas of the brain that make you feel good. These effects can make it hard for people to quit nicotine. Tobacco contains many other unsafe chemicals that can damage almost every organ in the body. Smoking tobacco also puts others in danger due to fire risk and possible health problems caused by breathing in secondhand smoke. What are the signs or symptoms? Symptoms of TUD may include:  Being unable to slow down or stop your tobacco use.  Spending an abnormal amount of time getting or using tobacco.  Craving tobacco. Cravings may last for up to 6  months after quitting.  Tobacco use that: ? Interferes with your work, school, or home life. ? Interferes with your personal and social relationships. ? Makes you give up activities that you once enjoyed or found important.  Using tobacco even though you know that it is: ? Dangerous or bad for your health or someone else's health. ? Causing problems in your life.  Needing more and more of the substance to get the same effect (developing tolerance).  Experiencing unpleasant symptoms if you do not use the substance (withdrawal). Withdrawal symptoms may include: ? Depressed, anxious, or irritable mood. ? Difficulty concentrating. ? Increased appetite. ? Restlessness or trouble sleeping.  Using the substance to avoid withdrawal. How is this diagnosed? This condition may be diagnosed based on:  Your current and past tobacco use. Your health care provider may ask questions about how your tobacco use affects your life.  A physical exam. You may be diagnosed with TUD if you have at least two symptoms within a 76-month period. How is this treated? This condition is treated by stopping tobacco use. Many people are unable to quit on their own and need help. Treatment may include:  Nicotine replacement therapy (NRT). NRT provides nicotine without the other harmful chemicals in tobacco. NRT gradually lowers the dosage of nicotine in the body and reduces withdrawal symptoms. NRT is available as: ? Over-the-counter gums, lozenges, and skin patches. ? Prescription mouth inhalers and nasal sprays.  Medicine that acts on the brain to reduce cravings and withdrawal symptoms.  A type of  talk therapy that examines your triggers for tobacco use, how to avoid them, and how to cope with cravings (behavioral therapy).  Hypnosis. This may help with withdrawal symptoms.  Joining a support group for others coping with TUD. The best treatment for TUD is usually a combination of medicine, talk therapy,  and support groups. Recovery can be a long process. Many people start using tobacco again after stopping (relapse). If you relapse, it does not mean that treatment will not work. Follow these instructions at home:  Lifestyle  Do not use any products that contain nicotine or tobacco, such as cigarettes and e-cigarettes.  Avoid things that trigger tobacco use as much as you can. Triggers include people and situations that usually cause you to use tobacco.  Avoid drinks that contain caffeine, including coffee. These may worsen some withdrawal symptoms.  Find ways to manage stress. Wanting to smoke may cause stress, and stress can make you want to smoke. Relaxation techniques such as deep breathing, meditation, and yoga may help.  Attend support groups as needed. These groups are an important part of long-term recovery for many people. General instructions  Take over-the-counter and prescription medicines only as told by your health care provider.  Check with your health care provider before taking any new prescription or over-the-counter medicines.  Decide on a friend, family member, or smoking quit-line (such as 1-800-QUIT-NOW in the [place] that you can call or text when you feel the urge to smoke or when you need help coping with cravings.  Keep all follow-up visits as told by your health care provider and therapist. This is important. Contact a health care provider if:  You are not able to take your medicines as prescribed.  Your symptoms get worse, even with treatment. Summary  Tobacco use disorder (TUD) occurs when a person craves, seeks, and uses tobacco regardless of the consequences.  This condition may be diagnosed based on your current and past tobacco use and a physical exam.  Many people are unable to quit on their own and need help. Recovery can be a long process.  The most effective treatment for TUD is usually a combination of medicine, talk therapy, and support  groups. This information is not intended to replace advice given to you by your health care provider. Make sure you discuss any questions you have with your health care provider. Document Released: 03/22/2004 Document Revised: 07/04/2017 Document Reviewed: 07/04/2017 Elsevier Patient Education  2020 [employer].
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
## 2019-07-02 ENCOUNTER — Other Ambulatory Visit: Payer: Self-pay

## 2019-07-02 ENCOUNTER — Ambulatory Visit (HOSPITAL_COMMUNITY)
Admission: RE | Admit: 2019-07-02 | Discharge: 2019-07-02 | Disposition: A | Discharge status: Home / Self Care | Payer: Self-pay | Source: Ambulatory Visit | Attending: Critical Care Medicine | Admitting: Critical Care Medicine

## 2019-07-02 DIAGNOSIS — I82411 Acute embolism and thrombosis of right femoral vein: Secondary | ICD-10-CM | POA: Insufficient documentation

## 2019-07-11 ENCOUNTER — Telehealth: Payer: Self-pay | Admitting: Licensed Clinical Social Worker

## 2019-07-11 NOTE — Telephone Encounter (Signed)
Return call placed to pt's spouse, Chantel Scouten. Mrs. Houchins shared that pt was denied for Medicaid despite them paying for pt's hospital bills between the months of July, August, and September 2020.   LCSW strongly encouraged family to apply for the CAFA program through our Financial Counselor to assist with medical bills. Mrs. Cansler was informed that other health agencies may have a financial assistance program that pt may be eligible for.  Pt verbalized understanding. No additional concerns noted.

## 2019-07-15 ENCOUNTER — Ambulatory Visit: Payer: Self-pay | Admitting: Orthopaedic Surgery

## 2019-07-15 DIAGNOSIS — R102 Pelvic and perineal pain: Secondary | ICD-10-CM
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
## 2019-07-21 ENCOUNTER — Other Ambulatory Visit: Payer: Self-pay

## 2019-07-21 ENCOUNTER — Ambulatory Visit: Payer: Self-pay | Attending: Critical Care Medicine | Admitting: Critical Care Medicine

## 2019-07-21 ENCOUNTER — Encounter: Payer: Self-pay | Admitting: Critical Care Medicine

## 2019-07-21 DIAGNOSIS — Z7289 Other problems related to lifestyle: Secondary | ICD-10-CM

## 2019-07-21 DIAGNOSIS — Z789 Other specified health status: Secondary | ICD-10-CM

## 2019-07-21 DIAGNOSIS — Z87828 Personal history of other (healed) physical injury and trauma: Secondary | ICD-10-CM

## 2019-07-21 DIAGNOSIS — F17210 Nicotine dependence, cigarettes, uncomplicated: Secondary | ICD-10-CM

## 2019-07-21 DIAGNOSIS — Z7901 Long term (current) use of anticoagulants: Secondary | ICD-10-CM

## 2019-07-21 DIAGNOSIS — F419 Anxiety disorder, unspecified: Secondary | ICD-10-CM

## 2019-07-21 DIAGNOSIS — F17200 Nicotine dependence, unspecified, uncomplicated: Secondary | ICD-10-CM

## 2019-07-21 DIAGNOSIS — I82411 Acute embolism and thrombosis of right femoral vein: Secondary | ICD-10-CM

## 2019-07-21 NOTE — Assessment & Plan Note (Signed)
Significant right lower extremity venous thrombosis still persisting  Plan will be to refer to interventional radiology to have a retrievable inferior to get a filter placed after the holidays  Patient maintain Xarelto 20 mg daily for now

## 2019-07-22 ENCOUNTER — Other Ambulatory Visit: Payer: Self-pay | Admitting: Critical Care Medicine

## 2019-07-22 ENCOUNTER — Telehealth: Payer: Self-pay | Admitting: Radiology

## 2019-07-22 DIAGNOSIS — I82411 Acute embolism and thrombosis of right femoral vein: Secondary | ICD-10-CM

## 2019-07-22 NOTE — Telephone Encounter (Signed)
Exercise treadmill test was ordered back in April 2020 when treadmill room was shut down due to Covid. It looks like yall tried to reach him back in Sept about scheduleing (see ms 9-14)  Please advise if patient still needs test scheduled.   If no longer needed please cancel the order

## 2019-07-30 ENCOUNTER — Telehealth: Payer: Self-pay | Admitting: Internal Medicine
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
## 2019-07-31 NOTE — Telephone Encounter (Signed)
Pt will call when he has recovered.  No action needed at this time.

## 2019-08-04 MED FILL — XARELTO 20 MG TABLET: 20 | 30 days supply | Qty: 30 | Fill #1

## 2019-08-06 ENCOUNTER — Ambulatory Visit
Admission: RE | Admit: 2019-08-06 | Discharge: 2019-08-06 | Disposition: A | Discharge status: Home / Self Care | Payer: Self-pay | Source: Ambulatory Visit | Attending: Critical Care Medicine | Admitting: Critical Care Medicine

## 2019-08-06 ENCOUNTER — Encounter: Payer: Self-pay | Admitting: *Deleted

## 2019-08-06 DIAGNOSIS — I82411 Acute embolism and thrombosis of right femoral vein: Secondary | ICD-10-CM

## 2019-08-06 HISTORY — PX: IR RADIOLOGIST EVAL & MGMT: IMG5224
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
## 2019-08-07 ENCOUNTER — Telehealth (HOSPITAL_COMMUNITY): Payer: Self-pay

## 2019-08-07 NOTE — Telephone Encounter (Signed)
Called to schedule ivc filter placement, no answer, left vm. AW 

## 2019-08-20 ENCOUNTER — Other Ambulatory Visit: Payer: Self-pay

## 2019-08-20 ENCOUNTER — Encounter: Payer: Self-pay | Admitting: Critical Care Medicine

## 2019-08-20 ENCOUNTER — Ambulatory Visit: Payer: Medicaid Other | Attending: Critical Care Medicine | Admitting: Critical Care Medicine

## 2019-08-20 VITALS — BP 146/96 | HR 104 | Temp 98.0°F | Resp 18 | Ht 74.0 in | Wt 197.0 lb

## 2019-08-20 DIAGNOSIS — F419 Anxiety disorder, unspecified: Secondary | ICD-10-CM

## 2019-08-20 DIAGNOSIS — Z9359 Other cystostomy status: Secondary | ICD-10-CM

## 2019-08-20 DIAGNOSIS — F17200 Nicotine dependence, unspecified, uncomplicated: Secondary | ICD-10-CM

## 2019-08-20 DIAGNOSIS — F17210 Nicotine dependence, cigarettes, uncomplicated: Secondary | ICD-10-CM

## 2019-08-20 DIAGNOSIS — Z7289 Other problems related to lifestyle: Secondary | ICD-10-CM

## 2019-08-20 DIAGNOSIS — Z789 Other specified health status: Secondary | ICD-10-CM

## 2019-08-20 DIAGNOSIS — Z87828 Personal history of other (healed) physical injury and trauma: Secondary | ICD-10-CM | POA: Diagnosis not present

## 2019-08-20 DIAGNOSIS — I82511 Chronic embolism and thrombosis of right femoral vein: Secondary | ICD-10-CM | POA: Diagnosis not present
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
## 2019-08-20 NOTE — Addendum Note (Signed)
Addended by: Storm Frisk on: 08/20/2019 09:44 AM   Modules accepted: Orders

## 2019-08-20 NOTE — Assessment & Plan Note (Signed)
The suprapubic catheter has been removed

## 2019-08-20 NOTE — Assessment & Plan Note (Addendum)
The patient will need to have his colostomy taken down per general surgery

## 2019-08-20 NOTE — Patient Instructions (Signed)
No change in medication  I am happy to prescribe Chantix for you for smoking cessation please give consideration to that  You can stay on your Xarelto through the procedure to have the inferior vena cava filter placed  Return to see Dr. Delford Field 3 months

## 2019-08-20 NOTE — Assessment & Plan Note (Addendum)
Chronic DVT RLE  Will stay on xarelto for now  The patient will also have a inferior vena cava filter placed in preparation for upcoming colostomy takedown and any further urologic procedures necessary

## 2019-08-20 NOTE — Assessment & Plan Note (Signed)
  .   Current smoking consumption amount: 1 pack a day  . Dicsussion on advise to quit smoking and smoking impacts: Importance on improving his wound healing and his lung and cardiac health  . Patient's willingness to quit: He is giving this consideration now  . Methods to quit smoking discussed: Chantix may be be the best option for this patient  . Medication management of smoking session drugs discussed: I offered Chantix he wishes to wait on this   . Setting quit date quit date not yet established  . Follow-up arranged follow-up 3 months   Time spent counseling the patient: 5 minutes

## 2019-08-27 ENCOUNTER — Other Ambulatory Visit: Payer: Self-pay | Admitting: Radiology

## 2019-08-29 ENCOUNTER — Other Ambulatory Visit: Payer: Self-pay

## 2019-08-29 ENCOUNTER — Ambulatory Visit (HOSPITAL_COMMUNITY)
Admission: RE | Admit: 2019-08-29 | Discharge: 2019-08-29 | Disposition: A | Discharge status: Home / Self Care | Payer: Medicaid Other | Source: Ambulatory Visit | Attending: Critical Care Medicine | Admitting: Critical Care Medicine

## 2019-08-29 DIAGNOSIS — Z7901 Long term (current) use of anticoagulants: Secondary | ICD-10-CM | POA: Insufficient documentation

## 2019-08-29 DIAGNOSIS — Z79899 Other long term (current) drug therapy: Secondary | ICD-10-CM | POA: Insufficient documentation

## 2019-08-29 DIAGNOSIS — I82411 Acute embolism and thrombosis of right femoral vein: Secondary | ICD-10-CM | POA: Diagnosis not present

## 2019-08-29 DIAGNOSIS — F17210 Nicotine dependence, cigarettes, uncomplicated: Secondary | ICD-10-CM | POA: Diagnosis not present

## 2019-08-29 DIAGNOSIS — Z933 Colostomy status: Secondary | ICD-10-CM | POA: Diagnosis not present

## 2019-08-29 DIAGNOSIS — I10 Essential (primary) hypertension: Secondary | ICD-10-CM | POA: Diagnosis not present

## 2019-08-29 HISTORY — PX: IR IVC FILTER PLMT / S&I /IMG GUID/MOD SED: IMG701

## 2019-08-29 MED ORDER — FENTANYL CITRATE (PF) 100 MCG/2ML IJ SOLN
INTRAMUSCULAR | Status: AC
  Filled 2019-08-29: qty 2

## 2019-08-29 MED ORDER — SODIUM CHLORIDE 0.9 % IV SOLN: INTRAVENOUS | Status: DC

## 2019-08-29 MED ORDER — IOHEXOL 300 MG/ML  SOLN
100.0000 mL | Freq: Once | INTRAMUSCULAR | Status: AC | PRN
  Administered 2019-08-29: 40 mL via INTRAVENOUS

## 2019-08-29 MED ORDER — MIDAZOLAM HCL 2 MG/2ML IJ SOLN
INTRAMUSCULAR | Status: AC
  Filled 2019-08-29: qty 2

## 2019-08-29 MED ORDER — FENTANYL CITRATE (PF) 100 MCG/2ML IJ SOLN
INTRAMUSCULAR | Status: AC | PRN
  Administered 2019-08-29 (×2): 50 ug via INTRAVENOUS

## 2019-08-29 MED ORDER — MIDAZOLAM HCL 2 MG/2ML IJ SOLN
INTRAMUSCULAR | Status: AC | PRN
  Administered 2019-08-29 (×2): 1 mg via INTRAVENOUS

## 2019-08-29 MED ORDER — LIDOCAINE HCL 1 % IJ SOLN
INTRAMUSCULAR | Status: AC
  Filled 2019-08-29: qty 20

## 2019-08-29 MED ORDER — LIDOCAINE HCL 1 % IJ SOLN
INTRAMUSCULAR | Status: AC | PRN
  Administered 2019-08-29: 5 mL

## 2019-08-29 NOTE — Procedures (Signed)
Pre procedural Dx: DVT with impending surgeries Post Procedural Dx: Same  Successful placement of an infrarenal IVC filter via the right internal jugular vein.  EBL: None  Complications: None immediate  Katherina Right, MD Pager #: [phone]

## 2019-08-29 NOTE — Discharge Instructions (Signed)
Inferior Vena Cava Filter Removal, Care After This sheet gives you information about how to care for yourself after your procedure. Your health care provider may also give you more specific instructions. If you have problems or questions, contact your health care provider. What can I expect after the procedure? After the procedure, it is common to have:  Mild pain and bruising around your incision in your neck or groin.  Fatigue. Follow these instructions at home: Incision care   Follow instructions from your health care provider about how to take care of your incision. Make sure you: ? Wash your hands with soap and water before you change your bandage (dressing). If soap and water are not available, use hand sanitizer. ? Change your dressing as told by your health care provider.  Check your incision area every day for signs of infection. Check for: ? Redness, swelling, or more pain. ? Fluid or blood. ? Warmth. ? Pus or a bad smell. General instructions  Take over-the-counter and prescription medicines only as told by your health care provider.  Do not take baths, swim, or use a hot tub until your health care provider approves. Ask your health care provider if you may take showers. You may only be allowed to take sponge baths.  Do not drive for 24 hours if you were given a medicine to help you relax (sedative) during your procedure.  Return to your normal activities as told by your health care provider. Ask your health care provider what activities are safe for you.  Keep all follow-up visits as told by your health care provider. This is important. Contact a health care provider if:  You have chills or a fever.  You have redness, swelling, or more pain around your incision.  Your incision feels warm to the touch.  You have pus or a bad smell coming from your incision. Get help right away if:  You have blood coming from your incision (active bleeding). ? If you have  bleeding from the incision site, lie down, apply pressure to the area with a clean cloth or gauze, and get help right away.  You have chest pain.  You have difficulty breathing. Summary  Follow instructions from your health care provider about how to take care of your incision.  Return to your normal activities as told by your health care provider.  Check your incision area every day for signs of infection.  Get help right away if you have active bleeding, chest pain, or trouble breathing. This information is not intended to replace advice given to you by your health care provider. Make sure you discuss any questions you have with your health care provider. Document Revised: 06/29/2017 Document Reviewed: 01/25/2017 Elsevier Patient Education  2020 [employer]  
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
## 2019-09-05 MED FILL — METOPROLOL TARTRATE 25 MG T: 25 | 30 days supply | Qty: 30 | Fill #1

## 2019-09-05 MED FILL — XARELTO 20 MG TABLET: 20 | 30 days supply | Qty: 30 | Fill #2

## 2019-09-12 ENCOUNTER — Telehealth: Payer: Self-pay | Admitting: Critical Care Medicine

## 2019-09-12 DIAGNOSIS — K219 Gastro-esophageal reflux disease without esophagitis: Secondary | ICD-10-CM | POA: Insufficient documentation

## 2019-09-12 MED ORDER — ONDANSETRON HCL 4 MG PO TABS: 4.0000 mg | ORAL_TABLET | Freq: Three times a day (TID) | ORAL | 0 refills | Status: DC | PRN

## 2019-09-12 MED ORDER — SERTRALINE HCL 50 MG PO TABS: 50.0000 mg | ORAL_TABLET | Freq: Every day | ORAL | 3 refills | Status: DC

## 2019-09-12 MED ORDER — PANTOPRAZOLE SODIUM 40 MG PO TBEC: 40.0000 mg | DELAYED_RELEASE_TABLET | Freq: Every day | ORAL | 3 refills | Status: DC

## 2019-09-12 MED FILL — PANTOPRAZOLE SOD DR 40 MG T: 40 | 30 days supply | Qty: 30 | Fill #0

## 2019-09-12 MED FILL — SERTRALINE HCL 50 MG TABLET: 50 | 30 days supply | Qty: 30 | Fill #0

## 2019-09-12 MED FILL — ONDANSETRON HCL 4 MG TABLET: 4 | 6 days supply | Qty: 20 | Fill #0

## 2019-09-12 NOTE — Telephone Encounter (Signed)
I called this patient who had sent me a my chart message  Patient is having increased nausea heartburn indigestion still drinking daily and notes increased anxiety and depression over his health  I will call in a short course of Zofran as needed begin him on omeprazole 40 mg daily and sertraline 50 mg daily I sent these to our pharmacy he is aware

## 2019-09-19 ENCOUNTER — Ambulatory Visit: Payer: Self-pay | Attending: Family Medicine

## 2019-09-19 ENCOUNTER — Other Ambulatory Visit: Payer: Self-pay

## 2019-10-14 MED FILL — METOPROLOL TARTRATE 25 MG T: 25 | 30 days supply | Qty: 30 | Fill #2

## 2019-10-14 MED FILL — CIPROFLOXACIN HCL 500 MG TA: 500 | 14 days supply | Qty: 28 | Fill #0

## 2019-10-14 MED FILL — XARELTO 20 MG TABLET: 20 | 30 days supply | Qty: 30 | Fill #3

## 2019-10-14 MED FILL — SERTRALINE HCL 50 MG TABLET: 50 | 30 days supply | Qty: 30 | Fill #1

## 2019-10-16 ENCOUNTER — Ambulatory Visit: Payer: Medicaid Other | Attending: Critical Care Medicine | Admitting: Critical Care Medicine

## 2019-10-16 ENCOUNTER — Encounter: Payer: Self-pay | Admitting: Critical Care Medicine

## 2019-10-16 ENCOUNTER — Other Ambulatory Visit: Payer: Self-pay

## 2019-10-16 DIAGNOSIS — Z87828 Personal history of other (healed) physical injury and trauma: Secondary | ICD-10-CM

## 2019-10-16 DIAGNOSIS — Z95828 Presence of other vascular implants and grafts: Secondary | ICD-10-CM

## 2019-10-16 DIAGNOSIS — Z933 Colostomy status: Secondary | ICD-10-CM | POA: Diagnosis not present

## 2019-10-16 DIAGNOSIS — I82511 Chronic embolism and thrombosis of right femoral vein: Secondary | ICD-10-CM

## 2019-10-16 NOTE — Assessment & Plan Note (Signed)
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  Patient also has an inferior vena cava filter in place.  When the colostomy is taken down from my perspective he could come off the Xarelto 2 days prior to the procedure and then resume if hemostasis is adequate when the surgeon dictates.  He has inferior vena cava filter in place to protect the patient off anticoagulation  Later as the patient heals up from the colostomy and is stable on Xarelto we will endeavor to have the filter retrieved

## 2019-10-16 NOTE — Assessment & Plan Note (Signed)
The patient has an inferior vena cava filter which is retrievable placed on August 29, 2019

## 2019-10-16 NOTE — Assessment & Plan Note (Signed)
No additional procedures are needed urologically therefore the patient now is at a point where colostomy takedown can occur  Unfortunately the surgical practice in [place] has told the patient that cannot accept any discounts on their surgical fees  Patient does not have any finances to be able to afford this and we do not have any resources to bring to bear to pay for this patient's co-pay for surgical fees  Plan is to refer this patient to [facility] general surgery clinic to see if they are willing to take this patient's case on and use the same discount the urology office has been using for their procedures
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
## 2019-11-27 ENCOUNTER — Other Ambulatory Visit: Payer: Self-pay | Admitting: Critical Care Medicine

## 2019-11-27 ENCOUNTER — Encounter: Payer: Self-pay | Admitting: Critical Care Medicine

## 2019-11-27 ENCOUNTER — Telehealth: Payer: Self-pay | Admitting: Critical Care Medicine

## 2019-11-27 MED ORDER — METOPROLOL TARTRATE 25 MG PO TABS: 25.0000 mg | ORAL_TABLET | Freq: Two times a day (BID) | ORAL | 3 refills | Status: DC

## 2019-11-27 MED FILL — NEOMYCIN 500 MG TABLET: 500 | 1 days supply | Qty: 6 | Fill #0

## 2019-11-27 MED FILL — metroNIDAZOLE 500 MG TABS: 500 | 1 days supply | Qty: 3 | Fill #0

## 2019-11-27 MED FILL — ONDANSETRON HCL 4 MG TABLET: 4 | 1 days supply | Qty: 3 | Fill #0

## 2019-11-27 MED FILL — ?METOPROLOL TART 25MG TABLE: 25 | 30 days supply | Qty: 60 | Fill #0

## 2019-11-27 MED FILL — XARELTO 20 MG TABLET: 20 | 30 days supply | Qty: 30 | Fill #4

## 2019-11-27 NOTE — Telephone Encounter (Signed)
Dylan Snyder, this patient needs a work in visit tomorrow.  Can that happen needs to be face to face for BP check

## 2019-11-27 NOTE — Telephone Encounter (Signed)
Patient called in and requested to inform pcp that he got blood work done yesterday and his Blood pressure and potassium are out of range and patient requested for a call on what could be done so he can still have his procedure done. Please follow up at your earliest convenience.

## 2019-11-27 NOTE — Progress Notes (Signed)
Pt had increase BP,  Low potassium.     Med refill and increase on metoprolol and recheck tomorrow

## 2019-11-27 NOTE — Telephone Encounter (Signed)
Patient was called and a voicemail was left informing patient that he needs to make an appointment for a BP check tomorrow.   If patient returns phone call, he can be placed on the nurse schedule.

## 2019-11-28 ENCOUNTER — Ambulatory Visit: Payer: Medicaid Other | Attending: Critical Care Medicine | Admitting: Pharmacist

## 2019-11-28 ENCOUNTER — Other Ambulatory Visit: Payer: Self-pay

## 2019-11-28 VITALS — BP 141/97 | HR 99

## 2019-11-28 DIAGNOSIS — Z0130 Encounter for examination of blood pressure without abnormal findings: Secondary | ICD-10-CM | POA: Diagnosis not present

## 2019-11-28 MED ORDER — AMLODIPINE BESYLATE 5 MG PO TABS: 5.0000 mg | ORAL_TABLET | Freq: Every day | ORAL | 2 refills | Status: DC

## 2019-11-28 MED FILL — ?AMLODIPINE BESYLATE 5MG TA: 5 | 30 days supply | Qty: 30 | Fill #0

## 2019-11-28 NOTE — Progress Notes (Signed)
Entered in error

## 2019-11-28 NOTE — Progress Notes (Signed)
   S:    Patient arrives in good spirits. Presents to the clinic for BP check.   Patient was referred on 11/27/2019 by Dr. Delford Field.    Medication adherence: confirms.  Current BP Medications include:  Metoprolol 25 mg BID  Dietary habits include: limits salt; denies drinking excessive amt of caffeine  Exercise habits include: limited   Family / Social history:  - FHx: no pertinent positives listed  - Tobacco: current everyday smoker - Alcohol: denies current use    O:  Vitals:   11/28/19 1434  BP: (!) 141/97  Pulse: 99   Home BP readings: reports that his values at home vary   Last 3 Office BP readings: BP Readings from Last 3 Encounters:  11/28/19 (!) 141/97  08/29/19 (!) 127/91  08/20/19 (!) 146/96    BMET    Component Value Date/Time   NA 138 06/05/2019 1258   K 4.2 06/05/2019 1258   CL 101 06/05/2019 1258   CO2 26 06/05/2019 1258   GLUCOSE 109 (H) 06/05/2019 1258   BUN 5 (L) 06/05/2019 1258   CREATININE 0.68 06/05/2019 1258   CALCIUM 9.3 06/05/2019 1258   GFRNONAA >60 06/05/2019 1258   GFRAA >60 06/05/2019 1258    Renal function: CrCl cannot be calculated (Patient's most recent lab result is older than the maximum 21 days allowed.).  Clinical ASCVD: No  The ASCVD Risk score Denman George DC Jr., et al., 2013) failed to calculate for the following reasons:   Cannot find a previous HDL lab   Cannot find a previous total cholesterol lab   A/P: Hypertension longstanding currently above goal on current medications. BP Goal = < 130/80 mmHg. Medication adherence: confirms. Pt with electrolyte abnormality and was just started on supplemental potassium. Will hold off on starting ACE/ARB and start low-dose Norvasc.  In the long term, switching from metoprolol to carvedilol would give better BP lowering.  -Started amlodipine 5 mg daily. -Continue metoprolol 25 mg BID. -Counseled on lifestyle modifications for blood pressure control including reduced dietary sodium,  increased exercise, adequate sleep.  Results reviewed and written information provided.   Total time in face-to-face counseling 15 minutes.   F/U Clinic Visit with Dr. Delford Field.    Butch Penny, PharmD, CPP Clinical Pharmacist [facility] [phone]

## 2019-12-01 ENCOUNTER — Other Ambulatory Visit: Payer: Self-pay | Admitting: Critical Care Medicine

## 2019-12-09 ENCOUNTER — Encounter: Payer: Self-pay | Admitting: Critical Care Medicine

## 2019-12-09 ENCOUNTER — Ambulatory Visit: Payer: Medicaid Other | Attending: Critical Care Medicine | Admitting: Critical Care Medicine

## 2019-12-09 ENCOUNTER — Other Ambulatory Visit: Payer: Self-pay

## 2019-12-09 VITALS — BP 152/95 | HR 88 | Temp 97.7°F | Ht 74.0 in | Wt 206.6 lb

## 2019-12-09 DIAGNOSIS — Z95828 Presence of other vascular implants and grafts: Secondary | ICD-10-CM

## 2019-12-09 DIAGNOSIS — F17200 Nicotine dependence, unspecified, uncomplicated: Secondary | ICD-10-CM | POA: Diagnosis not present

## 2019-12-09 DIAGNOSIS — I10 Essential (primary) hypertension: Secondary | ICD-10-CM | POA: Insufficient documentation

## 2019-12-09 DIAGNOSIS — Z933 Colostomy status: Secondary | ICD-10-CM

## 2019-12-09 DIAGNOSIS — I82511 Chronic embolism and thrombosis of right femoral vein: Secondary | ICD-10-CM | POA: Diagnosis not present

## 2019-12-09 DIAGNOSIS — E876 Hypokalemia: Secondary | ICD-10-CM | POA: Diagnosis not present

## 2019-12-09 MED ORDER — AMLODIPINE BESYLATE 10 MG PO TABS: 10.0000 mg | ORAL_TABLET | Freq: Every day | ORAL | 6 refills | Status: DC

## 2019-12-09 MED ORDER — ONDANSETRON HCL 4 MG PO TABS: 4.0000 mg | ORAL_TABLET | Freq: Three times a day (TID) | ORAL | 0 refills | Status: DC | PRN

## 2019-12-09 MED ORDER — METOPROLOL TARTRATE 50 MG PO TABS: 50.0000 mg | ORAL_TABLET | Freq: Two times a day (BID) | ORAL | 3 refills | Status: DC

## 2019-12-09 NOTE — Assessment & Plan Note (Signed)
Colostomy has been taken down he is now on as needed pain medicine will give Zofran for nausea

## 2019-12-09 NOTE — Assessment & Plan Note (Signed)
Tobacco cessation counseling given to this patient I spent 10 minutes on this

## 2019-12-09 NOTE — Assessment & Plan Note (Signed)
Continue current anticoagulant therapy with Xarelto 20 mg daily
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
## 2019-12-09 NOTE — Assessment & Plan Note (Signed)
Increase metoprolol to 50 mg twice daily and amlodipine 10 mg daily

## 2019-12-09 NOTE — Assessment & Plan Note (Signed)
Follow-up metabolic profile 

## 2019-12-09 NOTE — Patient Instructions (Signed)
Focus on smoking cessation, do meditation, listen to your favorite music, use nicotine replacement over the counter  Increase Metoprolol to 50mg  twice daily  Increase amlodipine to 10mg  daily  Labs metabolic profile today  Return Dr two months   Tobacco Use Disorder Tobacco use disorder (TUD) occurs when a person craves, seeks, and uses tobacco, regardless of the consequences. This disorder can cause problems with mental and physical health. It can affect your ability to have healthy relationships, and it can keep you from meeting your responsibilities at work, home, or school. Tobacco may be:  Smoked as a cigarette or cigar.  Inhaled using e-cigarettes.  Smoked in a pipe or hookah.  Chewed as smokeless tobacco.  Inhaled into the nostrils as snuff. Tobacco products contain a dangerous chemical called nicotine, which is very addictive. Nicotine triggers hormones that make the body feel stimulated and works on areas of the brain that make you feel good. These effects can make it hard for people to quit nicotine. Tobacco contains many other unsafe chemicals that can damage almost every organ in the body. Smoking tobacco also puts others in danger due to fire risk and possible health problems caused by breathing in secondhand smoke. What are the signs or symptoms? Symptoms of TUD may include:  Being unable to slow down or stop your tobacco use.  Spending an abnormal amount of time getting or using tobacco.  Craving tobacco. Cravings may last for up to 6 months after quitting.  Tobacco use that: ? Interferes with your work, school, or home life. ? Interferes with your personal and social relationships. ? Makes you give up activities that you once enjoyed or found important.  Using tobacco even though you know that it is: ? Dangerous or bad for your health or someone else's health. ? Causing problems in your life.  Needing more and more of the substance to get the same  effect (developing tolerance).  Experiencing unpleasant symptoms if you do not use the substance (withdrawal). Withdrawal symptoms may include: ? Depressed, anxious, or irritable mood. ? Difficulty concentrating. ? Increased appetite. ? Restlessness or trouble sleeping.  Using the substance to avoid withdrawal. How is this diagnosed? This condition may be diagnosed based on:  Your current and past tobacco use. Your health care provider may ask questions about how your tobacco use affects your life.  A physical exam. You may be diagnosed with TUD if you have at least two symptoms within a 33-month period. How is this treated? This condition is treated by stopping tobacco use. Many people are unable to quit on their own and need help. Treatment may include:  Nicotine replacement therapy (NRT). NRT provides nicotine without the other harmful chemicals in tobacco. NRT gradually lowers the dosage of nicotine in the body and reduces withdrawal symptoms. NRT is available as: ? Over-the-counter gums, lozenges, and skin patches. ? Prescription mouth inhalers and nasal sprays.  Medicine that acts on the brain to reduce cravings and withdrawal symptoms.  A type of talk therapy that examines your triggers for tobacco use, how to avoid them, and how to cope with cravings (behavioral therapy).  Hypnosis. This may help with withdrawal symptoms.  Joining a support group for others coping with TUD. The best treatment for TUD is usually a combination of medicine, talk therapy, and support groups. Recovery can be a long process. Many people start using tobacco again after stopping (relapse). If you relapse, it does not mean that treatment will not work. Follow  these instructions at home:  Lifestyle  Do not use any products that contain nicotine or tobacco, such as cigarettes and e-cigarettes.  Avoid things that trigger tobacco use as much as you can. Triggers include people and situations that  usually cause you to use tobacco.  Avoid drinks that contain caffeine, including coffee. These may worsen some withdrawal symptoms.  Find ways to manage stress. Wanting to smoke may cause stress, and stress can make you want to smoke. Relaxation techniques such as deep breathing, meditation, and yoga may help.  Attend support groups as needed. These groups are an important part of long-term recovery for many people. General instructions  Take over-the-counter and prescription medicines only as told by your health care provider.  Check with your health care provider before taking any new prescription or over-the-counter medicines.  Decide on a friend, family member, or smoking quit-line (such as 1-800-QUIT-NOW in the [place] that you can call or text when you feel the urge to smoke or when you need help coping with cravings.  Keep all follow-up visits as told by your health care provider and therapist. This is important. Contact a health care provider if:  You are not able to take your medicines as prescribed.  Your symptoms get worse, even with treatment. Summary  Tobacco use disorder (TUD) occurs when a person craves, seeks, and uses tobacco regardless of the consequences.  This condition may be diagnosed based on your current and past tobacco use and a physical exam.  Many people are unable to quit on their own and need help. Recovery can be a long process.  The most effective treatment for TUD is usually a combination of medicine, talk therapy, and support groups. This information is not intended to replace advice given to you by your health care provider. Make sure you discuss any questions you have with your health care provider. Document Revised: 07/04/2017 Document Reviewed: 07/04/2017 Elsevier Patient Education  2020 [employer].

## 2019-12-10 ENCOUNTER — Emergency Department (HOSPITAL_COMMUNITY): Payer: Self-pay

## 2019-12-10 ENCOUNTER — Encounter (HOSPITAL_COMMUNITY): Payer: Self-pay | Admitting: Emergency Medicine

## 2019-12-10 ENCOUNTER — Other Ambulatory Visit: Payer: Self-pay

## 2019-12-10 ENCOUNTER — Emergency Department (HOSPITAL_COMMUNITY)
Admission: EM | Admit: 2019-12-10 | Discharge: 2019-12-11 | Disposition: A | Discharge status: Home / Self Care | Payer: Self-pay | Attending: Emergency Medicine | Admitting: Emergency Medicine

## 2019-12-10 DIAGNOSIS — Z933 Colostomy status: Secondary | ICD-10-CM | POA: Insufficient documentation

## 2019-12-10 DIAGNOSIS — Z7901 Long term (current) use of anticoagulants: Secondary | ICD-10-CM | POA: Insufficient documentation

## 2019-12-10 DIAGNOSIS — I10 Essential (primary) hypertension: Secondary | ICD-10-CM | POA: Insufficient documentation

## 2019-12-10 DIAGNOSIS — F17210 Nicotine dependence, cigarettes, uncomplicated: Secondary | ICD-10-CM | POA: Insufficient documentation

## 2019-12-10 DIAGNOSIS — T8130XA Disruption of wound, unspecified, initial encounter: Secondary | ICD-10-CM | POA: Insufficient documentation

## 2019-12-10 DIAGNOSIS — Z79899 Other long term (current) drug therapy: Secondary | ICD-10-CM | POA: Insufficient documentation

## 2019-12-10 HISTORY — DX: Accidental discharge from unspecified firearms or gun, initial encounter: W34.00XA

## 2019-12-10 HISTORY — DX: Other specified postprocedural states: Z98.890

## 2019-12-10 LAB — MAGNESIUM: Magnesium: 1.8 mg/dL (ref 1.7–2.4)

## 2019-12-10 LAB — I-STAT CHEM 8, ED
BUN: <3 mg/dL — ABNORMAL LOW (ref 6–20)
Calcium, Ion: 1.02 mmol/L — ABNORMAL LOW (ref 1.15–1.40)
Chloride: 98 mmol/L (ref 98–111)
Creatinine, Ser: 0.6 mg/dL — ABNORMAL LOW (ref 0.61–1.24)
Glucose, Bld: 101 mg/dL — ABNORMAL HIGH (ref 70–99)
HCT: 35 % — ABNORMAL LOW (ref 39.0–52.0)
Hemoglobin: 11.9 g/dL — ABNORMAL LOW (ref 13.0–17.0)
Potassium: 2.7 mmol/L — CL (ref 3.5–5.1)
Sodium: 137 mmol/L (ref 135–145)
TCO2: 26 mmol/L (ref 22–32)

## 2019-12-10 LAB — CBC
HCT: 34.6 % — ABNORMAL LOW (ref 39.0–52.0)
Hemoglobin: 11.7 g/dL — ABNORMAL LOW (ref 13.0–17.0)
MCH: 39.5 pg — ABNORMAL HIGH (ref 26.0–34.0)
MCHC: 33.8 g/dL (ref 30.0–36.0)
MCV: 116.9 fL — ABNORMAL HIGH (ref 80.0–100.0)
Platelets: 470 10*3/uL — ABNORMAL HIGH (ref 150–400)
RBC: 2.96 MIL/uL — ABNORMAL LOW (ref 4.22–5.81)
RDW: 13 % (ref 11.5–15.5)
WBC: 11.8 10*3/uL — ABNORMAL HIGH (ref 4.0–10.5)
nRBC: 0 % (ref 0.0–0.2)

## 2019-12-10 LAB — BASIC METABOLIC PANEL
BUN/Creatinine Ratio: 5 — ABNORMAL LOW (ref 9–20)
BUN: 3 mg/dL — ABNORMAL LOW (ref 6–24)
CO2: 24 mmol/L (ref 20–29)
Calcium: 9.5 mg/dL (ref 8.7–10.2)
Chloride: 98 mmol/L (ref 96–106)
Creatinine, Ser: 0.6 mg/dL — ABNORMAL LOW (ref 0.76–1.27)
GFR calc Af Amer: 138 mL/min/{1.73_m2} (ref >59)
GFR calc non Af Amer: 120 mL/min/{1.73_m2} (ref >59)
Glucose: 110 mg/dL — ABNORMAL HIGH (ref 65–99)
Potassium: 4.3 mmol/L (ref 3.5–5.2)
Sodium: 138 mmol/L (ref 134–144)

## 2019-12-10 MED ORDER — POTASSIUM CHLORIDE 10 MEQ/100ML IV SOLN
10.0000 meq | Freq: Once | INTRAVENOUS | Status: AC
  Administered 2019-12-10: 10 meq via INTRAVENOUS
  Filled 2019-12-10: qty 100

## 2019-12-10 MED ORDER — LIDOCAINE-EPINEPHRINE (PF) 2 %-1:200000 IJ SOLN
10.0000 mL | Freq: Once | INTRAMUSCULAR | Status: AC
  Administered 2019-12-10: 10 mL via INTRADERMAL
  Filled 2019-12-10: qty 20

## 2019-12-10 MED ORDER — POTASSIUM CHLORIDE CRYS ER 20 MEQ PO TBCR
40.0000 meq | EXTENDED_RELEASE_TABLET | Freq: Once | ORAL | Status: AC
  Administered 2019-12-10: 40 meq via ORAL
  Filled 2019-12-10: qty 2

## 2019-12-10 NOTE — ED Triage Notes (Signed)
Pt had colostomy reversal at [facility] 1 week ago.  States he hasn't felt good today and noticed blood was saturated through his shirt and bandage.  He changed bandage and within 20 min bandage was saturated again.  On arrival pt seen at registration line with blood dripping in floor.  ABD pad and chux placed on top of previous bandage.  Pt taken to triage and old bandage removed and new abd pad and Hypafix tape applied.  Pt takes Xarelto.
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
## 2019-12-10 NOTE — ED Notes (Signed)
Lab to add on magnesium  

## 2019-12-11 MED ORDER — CEPHALEXIN 500 MG PO CAPS
500.0000 mg | ORAL_CAPSULE | Freq: Four times a day (QID) | ORAL | 0 refills | Status: DC
Start: 2019-12-11 — End: 2020-02-19

## 2019-12-11 NOTE — Discharge Instructions (Addendum)
You have a bleeding wound that was controlled with absorbable sutures.  Follow up with your doctor as recommended.  Monitor for any signs of infection or persistent bleeding.  Return if you have any concerns.  Your potassium level is low today.  Please have it recheck by your provider. There are signs of skin redness around the site, take antibiotic as prescribed.

## 2020-01-28 ENCOUNTER — Other Ambulatory Visit: Payer: Self-pay | Admitting: Critical Care Medicine

## 2020-01-28 MED ORDER — METOPROLOL TARTRATE 50 MG PO TABS: 50.0000 mg | ORAL_TABLET | Freq: Two times a day (BID) | ORAL | 3 refills | Status: DC

## 2020-01-28 MED ORDER — AMLODIPINE BESYLATE 10 MG PO TABS: 10.0000 mg | ORAL_TABLET | Freq: Every day | ORAL | 6 refills | Status: DC

## 2020-01-28 MED ORDER — PANTOPRAZOLE SODIUM 40 MG PO TBEC: 40.0000 mg | DELAYED_RELEASE_TABLET | Freq: Every day | ORAL | 3 refills | Status: DC

## 2020-01-28 MED ORDER — RIVAROXABAN 20 MG PO TABS: 20.0000 mg | ORAL_TABLET | Freq: Every day | ORAL | 4 refills | Status: DC

## 2020-01-28 MED FILL — ?METOPROLOL TART 50 MG TABL: 50 | 30 days supply | Qty: 60 | Fill #0

## 2020-01-28 MED FILL — ?PANTOPRAZOLE SO DR 40MG TA: 40 | 30 days supply | Qty: 30 | Fill #0

## 2020-01-28 MED FILL — XARELTO 20 MG TABLET: 20 | 30 days supply | Qty: 30 | Fill #0

## 2020-01-28 MED FILL — ?AMLODIPINE BESYL 10MG TABL: 10 | 30 days supply | Qty: 30 | Fill #0

## 2020-01-28 NOTE — Telephone Encounter (Signed)
Medication Refill - Medication:amLODipine (NORVASC) 10 MG tablet metoprolol tartrate (LOPRESSOR) 50 MG tablet pantoprazole (PROTONIX) 40 MG tablet rivaroxaban (XARELTO) 20 MG TABS tablet    Preferred Pharmacy (with phone number or street name): [facility] - [place], [place] - [address] Phone:  [phone]  Fax:  [phone]       Agent: Please be advised that RX refills may take up to 3 business days. We ask that you follow-up with your pharmacy.

## 2020-02-10 ENCOUNTER — Ambulatory Visit: Payer: Self-pay | Admitting: Critical Care Medicine

## 2020-02-19 ENCOUNTER — Ambulatory Visit: Payer: Medicaid Other | Attending: Critical Care Medicine | Admitting: Critical Care Medicine

## 2020-02-19 ENCOUNTER — Other Ambulatory Visit: Payer: Self-pay

## 2020-02-19 ENCOUNTER — Encounter: Payer: Self-pay | Admitting: Critical Care Medicine

## 2020-02-19 VITALS — BP 114/80 | HR 79 | Temp 97.6°F | Resp 16 | Ht 74.0 in | Wt 204.0 lb

## 2020-02-19 DIAGNOSIS — I82511 Chronic embolism and thrombosis of right femoral vein: Secondary | ICD-10-CM | POA: Diagnosis not present

## 2020-02-19 DIAGNOSIS — Z87828 Personal history of other (healed) physical injury and trauma: Secondary | ICD-10-CM

## 2020-02-19 DIAGNOSIS — Z95828 Presence of other vascular implants and grafts: Secondary | ICD-10-CM | POA: Diagnosis not present

## 2020-02-19 DIAGNOSIS — B370 Candidal stomatitis: Secondary | ICD-10-CM | POA: Diagnosis not present

## 2020-02-19 DIAGNOSIS — Z7289 Other problems related to lifestyle: Secondary | ICD-10-CM | POA: Diagnosis not present

## 2020-02-19 DIAGNOSIS — I10 Essential (primary) hypertension: Secondary | ICD-10-CM

## 2020-02-19 DIAGNOSIS — Z789 Other specified health status: Secondary | ICD-10-CM

## 2020-02-19 DIAGNOSIS — K219 Gastro-esophageal reflux disease without esophagitis: Secondary | ICD-10-CM

## 2020-02-19 MED ORDER — NYSTATIN 100000 UNIT/ML MT SUSP
5.0000 mL | Freq: Four times a day (QID) | OROMUCOSAL | 0 refills | Status: DC
Start: 2020-02-19 — End: 2020-05-13

## 2020-02-19 MED FILL — NYSTATIN 100,000 UNITS/ML S: 100000 | 23 days supply | Qty: 473 | Fill #0
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
## 2020-02-19 NOTE — Assessment & Plan Note (Signed)
All wounds are now healing

## 2020-02-19 NOTE — Assessment & Plan Note (Signed)
The patient's deep venous thrombosis appears to be resolved on exam will obtain venous Doppler ultrasound and give consideration to discontinuing Xarelto

## 2020-02-19 NOTE — Assessment & Plan Note (Signed)
Maintain Protonix

## 2020-02-19 NOTE — Assessment & Plan Note (Signed)
Evidence of oral candidiasis likely from recent antibiotic use  Plan will be to give the nystatin swish gargle and swallow 4 times daily

## 2020-02-19 NOTE — Patient Instructions (Signed)
Reduce alcohol use Take nystatin 1 tablespoon swish gargle and swallow 4 times daily until bottle gone  A referral for venous doppler ultrasound will be made  A referral for inferior vena cava filter removal will be made  Will consider stopping xarelto after filter is out depending on the presence of clots in the ultra sound  Return Dr Delford Field 2 months

## 2020-02-19 NOTE — Assessment & Plan Note (Addendum)
Hypertension well controlled we will maintain amlodipine and metoprolol

## 2020-02-19 NOTE — Assessment & Plan Note (Signed)
Would like to proceed with removal of the inferior vena cava filter

## 2020-02-20 ENCOUNTER — Ambulatory Visit (HOSPITAL_COMMUNITY)
Admission: RE | Admit: 2020-02-20 | Discharge: 2020-02-20 | Disposition: A | Discharge status: Home / Self Care | Payer: Medicaid Other | Source: Ambulatory Visit | Attending: Critical Care Medicine | Admitting: Critical Care Medicine

## 2020-02-20 ENCOUNTER — Ambulatory Visit: Payer: Self-pay | Admitting: *Deleted

## 2020-02-20 DIAGNOSIS — Z95828 Presence of other vascular implants and grafts: Secondary | ICD-10-CM | POA: Diagnosis not present

## 2020-02-20 DIAGNOSIS — I82511 Chronic embolism and thrombosis of right femoral vein: Secondary | ICD-10-CM

## 2020-02-20 LAB — CBC WITH DIFFERENTIAL/PLATELET
Basophils Absolute: 0.1 10*3/uL (ref 0.0–0.2)
Basos: 1 %
EOS (ABSOLUTE): 0.1 10*3/uL (ref 0.0–0.4)
Eos: 1 %
Hematocrit: 41.2 % (ref 37.5–51.0)
Hemoglobin: 14.8 g/dL (ref 13.0–17.7)
Immature Grans (Abs): 0.1 10*3/uL (ref 0.0–0.1)
Immature Granulocytes: 1 %
Lymphocytes Absolute: 2.8 10*3/uL (ref 0.7–3.1)
Lymphs: 32 %
MCH: 36.5 pg — ABNORMAL HIGH (ref 26.6–33.0)
MCHC: 35.9 g/dL — ABNORMAL HIGH (ref 31.5–35.7)
MCV: 102 fL — ABNORMAL HIGH (ref 79–97)
Monocytes Absolute: 1 10*3/uL — ABNORMAL HIGH (ref 0.1–0.9)
Monocytes: 11 %
Neutrophils Absolute: 4.6 10*3/uL (ref 1.4–7.0)
Neutrophils: 54 %
Platelets: 337 10*3/uL (ref 150–450)
RBC: 4.05 x10E6/uL — ABNORMAL LOW (ref 4.14–5.80)
RDW: 13.2 % (ref 11.6–15.4)
WBC: 8.5 10*3/uL (ref 3.4–10.8)

## 2020-02-20 LAB — COMPREHENSIVE METABOLIC PANEL
ALT: 87 IU/L — ABNORMAL HIGH (ref 0–44)
AST: 162 IU/L — ABNORMAL HIGH (ref 0–40)
Albumin/Globulin Ratio: 1.1 — ABNORMAL LOW (ref 1.2–2.2)
Albumin: 3.9 g/dL — ABNORMAL LOW (ref 4.0–5.0)
Alkaline Phosphatase: 225 IU/L — ABNORMAL HIGH (ref 48–121)
BUN/Creatinine Ratio: 6 — ABNORMAL LOW (ref 9–20)
BUN: 4 mg/dL — ABNORMAL LOW (ref 6–24)
Bilirubin Total: 1.1 mg/dL (ref 0.0–1.2)
CO2: 25 mmol/L (ref 20–29)
Calcium: 9.6 mg/dL (ref 8.7–10.2)
Chloride: 92 mmol/L — ABNORMAL LOW (ref 96–106)
Creatinine, Ser: 0.67 mg/dL — ABNORMAL LOW (ref 0.76–1.27)
GFR calc Af Amer: 132 mL/min/{1.73_m2} (ref >59)
GFR calc non Af Amer: 114 mL/min/{1.73_m2} (ref >59)
Globulin, Total: 3.4 g/dL (ref 1.5–4.5)
Glucose: 111 mg/dL — ABNORMAL HIGH (ref 65–99)
Potassium: 4.3 mmol/L (ref 3.5–5.2)
Sodium: 137 mmol/L (ref 134–144)
Total Protein: 7.3 g/dL (ref 6.0–8.5)

## 2020-02-20 NOTE — Telephone Encounter (Signed)
Dr. Wright, please advise

## 2020-02-20 NOTE — Telephone Encounter (Signed)
Dylan Snyder from [facility] vein and vascular with report.    He has an age indeterminate DVT only in his right femoral vein.    Pt is wanting to know if he needs to continue the Xarelto or not.   Please call and let him know.    Thanks.

## 2020-02-20 NOTE — Progress Notes (Signed)
Bilateral lower extremity venous duplex completed. Refer to "CV Proc" under chart review to view preliminary results.  02/20/2020 10:31 AM Eula Fried., MHA, RVT, RDCS, RDMS

## 2020-02-23 ENCOUNTER — Other Ambulatory Visit: Payer: Self-pay | Admitting: Interventional Radiology

## 2020-02-23 DIAGNOSIS — I82511 Chronic embolism and thrombosis of right femoral vein: Secondary | ICD-10-CM

## 2020-02-23 DIAGNOSIS — Z95828 Presence of other vascular implants and grafts: Secondary | ICD-10-CM

## 2020-03-02 ENCOUNTER — Other Ambulatory Visit: Payer: Self-pay

## 2020-03-02 ENCOUNTER — Encounter: Payer: Self-pay | Admitting: *Deleted

## 2020-03-02 ENCOUNTER — Ambulatory Visit
Admission: RE | Admit: 2020-03-02 | Discharge: 2020-03-02 | Disposition: A | Discharge status: Home / Self Care | Payer: Self-pay | Source: Ambulatory Visit | Attending: Interventional Radiology | Admitting: Interventional Radiology

## 2020-03-02 DIAGNOSIS — Z95828 Presence of other vascular implants and grafts: Secondary | ICD-10-CM

## 2020-03-02 DIAGNOSIS — I82511 Chronic embolism and thrombosis of right femoral vein: Secondary | ICD-10-CM

## 2020-03-02 HISTORY — PX: IR RADIOLOGIST EVAL & MGMT: IMG5224
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
## 2020-03-03 ENCOUNTER — Ambulatory Visit (HOSPITAL_COMMUNITY)
Admission: RE | Admit: 2020-03-03 | Discharge: 2020-03-03 | Disposition: A | Discharge status: Home / Self Care | Payer: Self-pay | Source: Ambulatory Visit | Attending: Critical Care Medicine | Admitting: Critical Care Medicine

## 2020-03-03 ENCOUNTER — Telehealth: Payer: Self-pay | Admitting: Critical Care Medicine

## 2020-03-03 DIAGNOSIS — M5442 Lumbago with sciatica, left side: Secondary | ICD-10-CM | POA: Insufficient documentation

## 2020-03-03 DIAGNOSIS — R200 Anesthesia of skin: Secondary | ICD-10-CM

## 2020-03-03 DIAGNOSIS — M5441 Lumbago with sciatica, right side: Secondary | ICD-10-CM | POA: Insufficient documentation

## 2020-03-03 DIAGNOSIS — G8929 Other chronic pain: Secondary | ICD-10-CM

## 2020-03-03 NOTE — Telephone Encounter (Signed)
I responded to [employer].  I will order lumbar study d/t patients leg and back pain

## 2020-03-05 ENCOUNTER — Telehealth: Payer: Self-pay | Admitting: Critical Care Medicine

## 2020-03-05 DIAGNOSIS — Z95828 Presence of other vascular implants and grafts: Secondary | ICD-10-CM

## 2020-03-05 DIAGNOSIS — I82511 Chronic embolism and thrombosis of right femoral vein: Secondary | ICD-10-CM

## 2020-03-05 DIAGNOSIS — M5416 Radiculopathy, lumbar region: Secondary | ICD-10-CM
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
## 2020-03-17 ENCOUNTER — Encounter: Payer: Self-pay | Admitting: Critical Care Medicine

## 2020-03-17 ENCOUNTER — Other Ambulatory Visit: Payer: Self-pay

## 2020-03-17 ENCOUNTER — Ambulatory Visit: Payer: Self-pay | Attending: Critical Care Medicine | Admitting: Critical Care Medicine

## 2020-03-17 DIAGNOSIS — G834 Cauda equina syndrome: Secondary | ICD-10-CM

## 2020-03-17 DIAGNOSIS — M5416 Radiculopathy, lumbar region: Secondary | ICD-10-CM
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
## 2020-03-17 NOTE — Assessment & Plan Note (Signed)
Lumbar back pain with radiculopathy  Again MRI of the back will be obtained

## 2020-03-17 NOTE — Assessment & Plan Note (Signed)
Dylan Snyder on telephone visit but I am worried about cauda equina compression of the lower spinal cord  MRI will be obtained as soon as possible

## 2020-03-21 ENCOUNTER — Other Ambulatory Visit: Payer: Self-pay

## 2020-03-21 ENCOUNTER — Ambulatory Visit (HOSPITAL_COMMUNITY)
Admission: RE | Admit: 2020-03-21 | Discharge: 2020-03-21 | Disposition: A | Discharge status: Home / Self Care | Payer: Self-pay | Source: Ambulatory Visit | Attending: Critical Care Medicine | Admitting: Critical Care Medicine

## 2020-03-21 DIAGNOSIS — M5416 Radiculopathy, lumbar region: Secondary | ICD-10-CM | POA: Insufficient documentation

## 2020-03-21 DIAGNOSIS — G834 Cauda equina syndrome: Secondary | ICD-10-CM | POA: Insufficient documentation

## 2020-03-21 MED ORDER — GADOBUTROL 1 MMOL/ML IV SOLN
8.0000 mL | Freq: Once | INTRAVENOUS | Status: AC | PRN
  Administered 2020-03-21: 8 mL via INTRAVENOUS

## 2020-03-26 ENCOUNTER — Ambulatory Visit: Payer: Self-pay | Admitting: Orthopaedic Surgery

## 2020-04-06 ENCOUNTER — Emergency Department (HOSPITAL_COMMUNITY)
Admission: EM | Admit: 2020-04-06 | Discharge: 2020-04-06 | Disposition: A | Discharge status: Home / Self Care | Payer: Medicaid Other | Attending: Emergency Medicine | Admitting: Emergency Medicine

## 2020-04-06 ENCOUNTER — Encounter (HOSPITAL_COMMUNITY): Payer: Self-pay | Admitting: Emergency Medicine

## 2020-04-06 ENCOUNTER — Other Ambulatory Visit: Payer: Self-pay

## 2020-04-06 DIAGNOSIS — Z5321 Procedure and treatment not carried out due to patient leaving prior to being seen by health care provider: Secondary | ICD-10-CM | POA: Diagnosis not present

## 2020-04-06 DIAGNOSIS — R202 Paresthesia of skin: Secondary | ICD-10-CM | POA: Insufficient documentation

## 2020-04-06 LAB — BASIC METABOLIC PANEL
Anion gap: 20 — ABNORMAL HIGH (ref 5–15)
BUN: <5 mg/dL — ABNORMAL LOW (ref 6–20)
CO2: 19 mmol/L — ABNORMAL LOW (ref 22–32)
Calcium: 9.2 mg/dL (ref 8.9–10.3)
Chloride: 89 mmol/L — ABNORMAL LOW (ref 98–111)
Creatinine, Ser: 0.82 mg/dL (ref 0.61–1.24)
GFR calc Af Amer: >60 mL/min (ref >60)
GFR calc non Af Amer: >60 mL/min (ref >60)
Glucose, Bld: 145 mg/dL — ABNORMAL HIGH (ref 70–99)
Potassium: 3.1 mmol/L — ABNORMAL LOW (ref 3.5–5.1)
Sodium: 128 mmol/L — ABNORMAL LOW (ref 135–145)

## 2020-04-06 LAB — CBC
HCT: 39.8 % (ref 39.0–52.0)
Hemoglobin: 14.3 g/dL (ref 13.0–17.0)
MCH: 37.1 pg — ABNORMAL HIGH (ref 26.0–34.0)
MCHC: 35.9 g/dL (ref 30.0–36.0)
MCV: 103.4 fL — ABNORMAL HIGH (ref 80.0–100.0)
Platelets: 191 10*3/uL (ref 150–400)
RBC: 3.85 MIL/uL — ABNORMAL LOW (ref 4.22–5.81)
RDW: 12.8 % (ref 11.5–15.5)
WBC: 8.7 10*3/uL (ref 4.0–10.5)
nRBC: 0 % (ref 0.0–0.2)

## 2020-04-06 NOTE — ED Triage Notes (Signed)
Pt reports bilateral leg numbness for a few weeks. His PCP is aware and sent him for MRI. MRI showed multiple pinched nerves. Pt reports the numbness has moved up to his upper legs.

## 2020-04-07 ENCOUNTER — Other Ambulatory Visit: Payer: Self-pay | Admitting: Family Medicine

## 2020-04-07 DIAGNOSIS — G834 Cauda equina syndrome: Secondary | ICD-10-CM

## 2020-04-13 ENCOUNTER — Telehealth: Payer: Self-pay | Admitting: Critical Care Medicine

## 2020-04-13 NOTE — Telephone Encounter (Signed)
Copied from CRM [id number]. Topic: General - Other >> Apr 12, 2020  3:25 PM Jaquita Rector A wrote: Reason for CRM: Patient called to inquire of Dr Delford Field when he will be hearing something from the neurology referral. Asking for a call back at Ph# [phone]

## 2020-04-15 ENCOUNTER — Other Ambulatory Visit: Payer: Self-pay | Admitting: Critical Care Medicine

## 2020-04-15 NOTE — Telephone Encounter (Signed)
Medication Refill - Medication: amLODipine (NORVASC) 10 MG tablet,metoprolol tartrate (LOPRESSOR) 50 MG tablet ,amLODipine (NORVASC) 10 MG tablet  Has the patient contacted their pharmacy?yes (Agent: If no, request that the patient contact the pharmacy for the refill.) (Agent: If yes, when and what did the pharmacy advise?)Contact PCP  Preferred Pharmacy (with phone number or street name):  [facility] [place] [place] [address] Phone:  [phone]  Fax:  [phone]       Agent: Please be advised that RX refills may take up to 3 business days. We ask that you follow-up with your pharmacy.

## 2020-04-16 ENCOUNTER — Other Ambulatory Visit: Payer: Self-pay | Admitting: Critical Care Medicine

## 2020-04-16 DIAGNOSIS — G834 Cauda equina syndrome: Secondary | ICD-10-CM

## 2020-04-16 DIAGNOSIS — M5416 Radiculopathy, lumbar region: Secondary | ICD-10-CM

## 2020-04-16 MED FILL — ?METOPROLOL TART 50 MG TABL: 50 | 30 days supply | Qty: 60 | Fill #1

## 2020-04-16 MED FILL — XARELTO 20 MG TABLET: 20 | 30 days supply | Qty: 30 | Fill #1

## 2020-04-16 MED FILL — AMLODIPINE BESYLATE 10 MG T: 10 | 30 days supply | Qty: 30 | Fill #1

## 2020-04-16 NOTE — Progress Notes (Signed)
Referral to neurosurgery.

## 2020-04-18 ENCOUNTER — Emergency Department (HOSPITAL_COMMUNITY)
Admission: EM | Admit: 2020-04-18 | Discharge: 2020-04-18 | Disposition: A | Discharge status: Home / Self Care | Payer: Medicaid Other | Attending: Emergency Medicine | Admitting: Emergency Medicine

## 2020-04-18 ENCOUNTER — Other Ambulatory Visit: Payer: Self-pay

## 2020-04-18 ENCOUNTER — Emergency Department (HOSPITAL_COMMUNITY): Payer: Medicaid Other

## 2020-04-18 ENCOUNTER — Encounter (HOSPITAL_COMMUNITY): Payer: Self-pay | Admitting: *Deleted

## 2020-04-18 DIAGNOSIS — R0602 Shortness of breath: Secondary | ICD-10-CM | POA: Diagnosis not present

## 2020-04-18 DIAGNOSIS — Z5321 Procedure and treatment not carried out due to patient leaving prior to being seen by health care provider: Secondary | ICD-10-CM | POA: Insufficient documentation

## 2020-04-18 DIAGNOSIS — R531 Weakness: Secondary | ICD-10-CM | POA: Diagnosis not present

## 2020-04-18 NOTE — ED Notes (Signed)
I called patient to take back to a room and no one responded 

## 2020-04-18 NOTE — ED Notes (Signed)
I called patient to recheck vital signs and no one responded 
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
## 2020-04-19 ENCOUNTER — Telehealth: Payer: Self-pay | Admitting: Critical Care Medicine

## 2020-04-19 NOTE — Telephone Encounter (Signed)
Per referral note: referral was sent to [facility] and they will be contacting him to schedule an appt. Also gave pt the phone # for their office.

## 2020-04-19 NOTE — Telephone Encounter (Signed)
Pt calling wanting to know status of neurosurgery referral  I told him referral is in process.  He went to ed this weekend and did not stay for evaluations  He is drinking more ETOH.  I will check on status of referral ,  I told him if worse he needs to go back to ED

## 2020-04-19 NOTE — Telephone Encounter (Signed)
Copied from CRM [id number]. Topic: General - Other >> Apr 19, 2020 10:03 AM Dalphine Handing A wrote: Patient is requesting a callback from Dr. Delford Field in regards to his leg pain. Offered patient first available appt, patient declined. Please advise

## 2020-04-28 DIAGNOSIS — Z7901 Long term (current) use of anticoagulants: Secondary | ICD-10-CM | POA: Insufficient documentation

## 2020-04-28 DIAGNOSIS — K7010 Alcoholic hepatitis without ascites: Secondary | ICD-10-CM

## 2020-04-28 DIAGNOSIS — F10931 Alcohol use, unspecified with withdrawal delirium: Secondary | ICD-10-CM

## 2020-04-28 DIAGNOSIS — D649 Anemia, unspecified: Secondary | ICD-10-CM | POA: Insufficient documentation

## 2020-04-28 DIAGNOSIS — F10231 Alcohol dependence with withdrawal delirium: Secondary | ICD-10-CM

## 2020-04-28 HISTORY — DX: Alcohol use, unspecified with withdrawal delirium: F10.931

## 2020-04-28 HISTORY — DX: Alcohol dependence with withdrawal delirium: F10.231

## 2020-04-28 HISTORY — DX: Alcoholic hepatitis without ascites: K70.10

## 2020-04-30 DIAGNOSIS — N3000 Acute cystitis without hematuria: Secondary | ICD-10-CM

## 2020-04-30 HISTORY — DX: Acute cystitis without hematuria: N30.00

## 2020-05-10 ENCOUNTER — Other Ambulatory Visit: Payer: Self-pay

## 2020-05-11 MED FILL — GABAPENTIN 300 MG CAPSULE: 300 | 30 days supply | Qty: 90 | Fill #0

## 2020-05-11 MED FILL — NICOTINE 21 MG/24HR PATCH: 21 | 28 days supply | Qty: 28 | Fill #0

## 2020-05-11 MED FILL — FOLIC ACID 1 MG TABS: 1 | 30 days supply | Qty: 30 | Fill #0

## 2020-05-12 ENCOUNTER — Telehealth: Payer: Self-pay

## 2020-05-12 MED FILL — PANTOPRAZOLE SOD DR 40 MG T: 40 | 30 days supply | Qty: 30 | Fill #1

## 2020-05-12 MED FILL — XARELTO 20 MG TABLET: 20 | 30 days supply | Qty: 30 | Fill #1

## 2020-05-12 NOTE — Telephone Encounter (Signed)
Patient contacted the [facility] Case Manager for follow message from PCP. [facility] Case Manager contacted PCP to clarify details on location for the patient's updated visit location and time. Patient's spouse was able to confirm patient's appt for tomorrow.

## 2020-05-13 ENCOUNTER — Encounter: Payer: Self-pay | Admitting: Neurology

## 2020-05-13 ENCOUNTER — Other Ambulatory Visit: Payer: Self-pay | Admitting: Critical Care Medicine

## 2020-05-13 ENCOUNTER — Other Ambulatory Visit: Payer: Self-pay

## 2020-05-13 ENCOUNTER — Encounter: Payer: Self-pay | Admitting: Critical Care Medicine

## 2020-05-13 ENCOUNTER — Ambulatory Visit: Payer: Self-pay | Attending: Critical Care Medicine | Admitting: Critical Care Medicine

## 2020-05-13 VITALS — BP 127/84 | HR 114 | Temp 97.3°F | Resp 18 | Ht 74.0 in | Wt 186.2 lb

## 2020-05-13 DIAGNOSIS — D649 Anemia, unspecified: Secondary | ICD-10-CM

## 2020-05-13 DIAGNOSIS — K7010 Alcoholic hepatitis without ascites: Secondary | ICD-10-CM

## 2020-05-13 DIAGNOSIS — F1096 Alcohol use, unspecified with alcohol-induced persisting amnestic disorder: Secondary | ICD-10-CM

## 2020-05-13 DIAGNOSIS — G834 Cauda equina syndrome: Secondary | ICD-10-CM

## 2020-05-13 DIAGNOSIS — F1010 Alcohol abuse, uncomplicated: Secondary | ICD-10-CM

## 2020-05-13 DIAGNOSIS — Z23 Encounter for immunization: Secondary | ICD-10-CM

## 2020-05-13 DIAGNOSIS — F10231 Alcohol dependence with withdrawal delirium: Secondary | ICD-10-CM

## 2020-05-13 DIAGNOSIS — Z7901 Long term (current) use of anticoagulants: Secondary | ICD-10-CM

## 2020-05-13 DIAGNOSIS — Z95828 Presence of other vascular implants and grafts: Secondary | ICD-10-CM

## 2020-05-13 DIAGNOSIS — F10931 Alcohol use, unspecified with withdrawal delirium: Secondary | ICD-10-CM

## 2020-05-13 DIAGNOSIS — I82511 Chronic embolism and thrombosis of right femoral vein: Secondary | ICD-10-CM

## 2020-05-13 DIAGNOSIS — N3000 Acute cystitis without hematuria: Secondary | ICD-10-CM

## 2020-05-13 MED ORDER — METOPROLOL TARTRATE 50 MG PO TABS
50.0000 mg | ORAL_TABLET | Freq: Two times a day (BID) | ORAL | 3 refills | Status: DC
Start: 2020-05-13 — End: 2020-07-14

## 2020-05-13 MED ORDER — AMLODIPINE BESYLATE 10 MG PO TABS
10.0000 mg | ORAL_TABLET | Freq: Every day | ORAL | 6 refills | Status: DC
Start: 2020-05-13 — End: 2020-07-14

## 2020-05-13 MED ORDER — PANTOPRAZOLE SODIUM 40 MG PO TBEC
40.0000 mg | DELAYED_RELEASE_TABLET | Freq: Every day | ORAL | 3 refills | Status: DC
Start: 2020-05-13 — End: 2020-07-14

## 2020-05-13 MED FILL — METOPROLOL TARTRATE 50 MG T: 50 | 30 days supply | Qty: 60 | Fill #0

## 2020-05-13 MED FILL — AMLODIPINE BESYLATE 10 MG T: 10 | 30 days supply | Qty: 30 | Fill #0

## 2020-05-13 NOTE — Assessment & Plan Note (Signed)
Did not plan to retrieve this filter for now

## 2020-05-13 NOTE — Assessment & Plan Note (Signed)
The patient underwent a course of treatment for acute cystitis that has resolved

## 2020-05-13 NOTE — Assessment & Plan Note (Signed)
Mild anemia stable at this time

## 2020-05-13 NOTE — Assessment & Plan Note (Signed)
Alcoholic hepatitis follow-up liver functions

## 2020-05-13 NOTE — Assessment & Plan Note (Signed)
MRI negative for cauda equina compression

## 2020-05-13 NOTE — Assessment & Plan Note (Signed)
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

## 2020-05-13 NOTE — Assessment & Plan Note (Signed)
Continue Xarelto for now 

## 2020-05-13 NOTE — Progress Notes (Signed)
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 infarction, hemorrhage, hydrocephalus, extra-axial collection or mass lesion/mass effect. Mild diffuse cerebral atrophy. Vascular: No hyperdense vessel or unexpected calcification. Skull: Calvarium appears intact. Sinuses/Orbits: Paranasal sinuses and mastoid air cells are clear. Other: None. IMPRESSION: 1. No acute intracranial abnormalities. 2. Mild diffuse cerebral atrophy. Electronically Signed By: Burman Nieves M.D. On: 04/28/2020 06:08    8/22 MRI Lumbar: IMPRESSION: No stenosis.  L3-4: Annular fissure in the right extraforaminal region adjacent to the right L3 nerve. No nerve compression. Nerve irritation is possible.  L4-5: Minimal disc bulge and facet prominence. No stenosis. The facet arthritis could be associated with low back pain.  L5-S1: Central annular fissures and annular bulging, adjacent to the S1 nerves. No nerve compression, but neural irritation could occur. 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     Assessment & Plan:  I personally reviewed all images and lab data in the [facility] system as well as any outside material available during this office visit and agree with the  radiology impressions.   Alcohol withdrawal delirium (HCC) Severe alcohol withdrawal  delirium  This is slowly improving and the patient has been detoxed from alcohol at this time and is off lorazepam  We will continue high-dose thiamine B6 B12 and folic acid    Acute cystitis without hematuria The patient underwent a course of treatment for acute cystitis that has resolved  Mild anemia Mild anemia stable at this time  Presence of IVC filter Did not plan to retrieve this filter for now  On continuous oral anticoagulation Continue Xarelto for now  Cauda equina compression (HCC) MRI negative for cauda equina compression  Alcoholic hepatitis Alcoholic hepatitis follow-up liver functions    Wernicke-Korsakoff syndrome (alcoholic) (HCC) Wernicke's Korsakoff syndrome with cerebellar findings on MRI and other findings seen on the MRI  Will refer to physical therapy, physical medicine, and neurology  Continue thiamine therapy and alcohol abstinence  Follow-up with psychiatry and behavioral health   Efstathios was seen today for hospitalization follow-up.  Diagnoses and all orders for this visit:  Wernicke-Korsakoff syndrome (alcoholic) (HCC) -     Ambulatory referral to Physical Therapy -     Ambulatory referral to Physical Medicine Rehab -     Ambulatory referral to Neurology -     Comprehensive metabolic panel -     CBC with Differential/Platelet  Alcohol abuse -     Comprehensive metabolic panel -     CBC with Differential/Platelet  Chronic deep vein thrombosis (DVT) of femoral vein of right lower extremity (HCC) -     CBC with Differential/Platelet  Chronic anticoagulation -     CBC with Differential/Platelet  Need for immunization against influenza -     Flu Vaccine QUAD 36+ mos IM  Alcohol withdrawal delirium (HCC)  Acute cystitis without hematuria  Mild anemia  Presence of IVC filter  On continuous oral anticoagulation  Cauda equina compression (HCC)  Alcoholic hepatitis without ascites  Other orders -     amLODipine (NORVASC) 10  MG tablet; Take 1 tablet (10 mg total) by mouth daily. -     pantoprazole (PROTONIX) 40 MG tablet; Take 1 tablet (40 mg total) by mouth daily. -     metoprolol tartrate (LOPRESSOR) 50 MG tablet; Take 1 tablet (50 mg total) by mouth 2 (two) times daily.

## 2020-05-13 NOTE — Patient Instructions (Signed)
Referral to physical therapy, physical medicine, and neurology were made  Keep your appointment with behavioral medicine  Our licensed clinical social worker Jenel Lucks will get in touch with you  Labs today include metabolic panel and blood counts  All of your blood pressure medicines and stomach acid medicines were refilled  Continue the supplements and medicines given to you from your recent hospitalization  Refills on the supplements were sent to our pharmacy  Continue to abstain from alcohol  As your liver improves we will then refer you to the orthopedic spine group since you now have the [place] discount  A flu shot was given today  Please get your Covid vaccine either Pfizer or moderna: COVID-19 Vaccine Information can be found at: [email or web address].[email or web address] For questions related to vaccine distribution or appointments, please email [email or web address] or call [phone].   Return to see Dr. Delford Field in 1 month   Alcohol Abuse and Dependence Information, Adult Alcohol is a widely available drug. People drink alcohol in different amounts. People who drink alcohol very often and in large amounts often have problems during and after drinking. They may develop what is called an alcohol use disorder. There are two main types of alcohol use disorders:  Alcohol abuse. This is when you use alcohol too much or too often. You may use alcohol to make yourself feel happy or to reduce stress. You may have a hard time setting a limit on the amount you drink.  Alcohol dependence. This is when you use alcohol consistently for a period of time, and your body changes as a result. This can make it hard to stop drinking because you may start to feel sick or feel different when you do not use alcohol. These symptoms are known as withdrawal. How can alcohol abuse and dependence affect me? Alcohol abuse and dependence can have a negative  effect on your life. Drinking too much can lead to addiction. You may feel like you need alcohol to function normally. You may drink alcohol before work in the morning, during the day, or as soon as you get home from work in the evening. These actions can result in:  Poor work performance.  Job loss.  Financial problems.  Car crashes or criminal charges from driving after drinking alcohol.  Problems in your relationships with friends and family.  Losing the trust and respect of coworkers, friends, and family. Drinking heavily over a long period of time can permanently damage your body and brain, and can cause lifelong health issues, such as:  Damage to your liver or pancreas.  Heart problems, high blood pressure, or stroke.  Certain cancers.  Decreased ability to fight infections.  Brain or nerve damage.  Depression.  Early (premature) death. If you are careless or you crave alcohol, it is easy to drink more than your body can handle (overdose). Alcohol overdose is a serious situation that requires hospitalization. It may lead to permanent injuries or death. What can increase my risk?  Having a family history of alcohol abuse.  Having depression or other mental health conditions.  Beginning to drink at an early age.  Binge drinking often.  Experiencing trauma, stress, and an unstable home life during childhood.  Spending time with people who drink often. What actions can I take to prevent or manage alcohol abuse and dependence?  Do not drink alcohol if: ? Your health care provider tells you not to drink. ? You are pregnant, may be pregnant, or are planning to become pregnant.  If you drink alcohol: ? Limit how much you use to:  0-1 drink a day for women.  0-2 drinks a day for men. ? Be aware of how much alcohol is in your drink. In the [place] one drink equals one 12 oz bottle of beer (355 mL), one 5 oz glass of wine (148 mL), or one 1 oz glass of hard liquor (44  mL).  Stop drinking if you have been drinking too much. This can be very hard to do if you are used to abusing alcohol. If you begin to have withdrawal symptoms, talk with your health care provider or a person that you trust. These symptoms may include anxiety, shaky hands, headache, nausea, sweating, or not being able to sleep.  Choose to drink nonalcoholic beverages in social gatherings and places where there may be alcohol. Activity  Spend more time on activities that you enjoy that do not involve alcohol, like hobbies or exercise.  Find healthy ways to cope with stress, such as exercise, meditation, or spending time with people you care about. General information  Talk to your family, coworkers, and friends about supporting you in your efforts to stop drinking. If they drink, ask them not to drink around you. Spend more time with people who do not drink alcohol.  If you think that you have an alcohol dependency problem: ? Tell friends or family about your concerns. ? Talk with your health care provider or another health professional about where to get help. ? Work with a Paramedic and a Network engineer. ? Consider joining a support group for people who struggle with alcohol abuse and dependence. Where to find support   Your health care provider.  SMART Recovery: www.smartrecovery.org Therapy and support groups  Local treatment centers or chemical dependency counselors.  Local AA groups in your community: SalaryStart.tn Where to find more information  Centers for Disease Control and Prevention: FootballExhibition.com.br  [employer] on Alcohol Abuse and Alcoholism: BasicStudents.dk  Alcoholics Anonymous (AA): SalaryStart.tn Contact a health care provider if:  You drank more or for longer than you intended on more than one occasion.  You tried to stop drinking or to cut back on how much you drink, but you were not able to.  You often drink to the point of vomiting or  passing out.  You want to drink so badly that you cannot think about anything else.  You have problems in your life due to drinking, but you continue to drink.  You keep drinking even though you feel anxious, depressed, or have experienced memory loss.  You have stopped doing the things you used to enjoy in order to drink.  You have to drink more than you used to in order to get the effect you want.  You experience anxiety, sweating, nausea, shakiness, and trouble sleeping when you try to stop drinking. Get help right away if:  You have thoughts about hurting yourself or others.  You have serious withdrawal symptoms, including: ? Confusion. ? Racing heart. ? High blood pressure. ? Fever. If you ever feel like you may hurt yourself or others, or have thoughts about taking your own life, get help right away. You can go to your nearest emergency department or call:  Your local emergency services (911 in the [place]  A suicide crisis helpline, such as the National Suicide Prevention Lifeline at (403) 639-7211. This is open 24 hours a day. Summary  Alcohol abuse and dependence can have a negative effect on your life.  Drinking too much or too often can lead to addiction.  If you drink alcohol, limit how much you use.  If you are having trouble keeping your drinking under control, find ways to change your behavior. Hobbies, calming activities, exercise, or support groups can help.  If you feel you need help with changing your drinking habits, talk with your health care provider, a good friend, or a therapist, or go to an AA group. This information is not intended to replace advice given to you by your health care provider. Make sure you discuss any questions you have with your health care provider. Document Revised: 11/05/2018 Document Reviewed: 09/24/2018 Elsevier Patient Education  2020 Elsevier Inc.  Wernicke-Korsakoff Syndrome  Wernicke-Korsakoff syndrome (WKS) is a brain  disorder that impairs a person's mental health and causes memory loss and unstable emotions (psychosis). The term actually refers to two conditions that often occur together. Wernicke encephalopathy happens first. If the disease gets worse, Wernicke encephalopathy is followed by Korsakoff syndrome (Korsakoff psychosis). It is caused by not getting enough vitamin B1 (thiamine). This condition can permanently damage the areas of the brain that are responsible for memory. Wernicke encephalopathy is a medical emergency. What are the causes? This condition is caused by a lack of vitamin B1 (thiamine). This deficiency can result from:  Alcoholism. This is the most common cause.  Poor nutrition.  Certain medical conditions.  Bariatric or gastrointestinal surgery. What increases the risk? The following factors may make you more likely to develop this condition:  Having severe alcoholism for 5 years or longer.  Poor nutrition.  AIDS (acquired immunodeficiency syndrome).  Long-term (chronic) infections.  Failing to eat properly because of fear of weight gain (anorexia nervosa).  Kidney dialysis.  Advanced cancer. What are the signs or symptoms? Symptoms of this condition may vary based on the stage of the disease. Symptoms of Wernicke encephalopathy include:  Uncoordinated muscles.  Abnormal eye movements.  Confusion.  Loss of other mental abilities.  Double vision.  Drooping eyelids.  Symptoms of alcohol withdrawal. As the disease gets worse, symptoms of Korsakoff syndrome may develop. These include:  Severe memory loss.  Inability to form new memories.  Seeing, hearing, tasting, smelling, or feeling things that are not real (hallucinations).  Tendency to make up stories. How is this diagnosed? This condition may be diagnosed based on:  Your symptoms and medical history. Your health care provider may suspect this condition if you abuse alcohol.  A physical  exam.  Blood tests to check your thiamine level and to look for other signs of malnutrition.  Tests to check for memory loss.  Imaging studies to look for changes in the brain and other body parts. These may include MRI and CT scans. How is this treated? This condition may be treated with:  Thiamine replacement therapy. You may be given thiamine through an IV or by mouth.  Treatment for alcoholism.  Treatment for other nutritional problems.  Medicine and mental health counseling for chronic dementia if appropriate. Treatment for this condition needs to start early. If the condition is diagnosed and treated in the early stages, it can be reversed. If the condition is left untreated, it can cause permanent brain damage. Follow these instructions at home:  Take over-the-counter and prescription medicines only as told by your health care provider.  Do not drink alcohol. Get treatment for alcoholism if needed.  Eat a nutritious, well-balanced diet. ? Be sure to include plenty of vegetables, fruits, low-fat dairy products, and  lean protein. ? Limit foods that are high in solid fats, added sugars, or salt.  Get regular exercise. ? Most adults should exercise for at least 150 minutes each week. The exercise should increase your heart rate and make you sweat (moderate-intensity exercise). ? Most adults should also do strengthening exercises at least twice a week. This is in addition to the moderate-intensity exercise.  Get caregiver support if needed.  Keep all follow-up visits as told by your health care provider. This is important. Visits may include sessions for mental health counseling, or meeting with a nutrition specialist (dietitian). Contact a health care provider if you have:  Confusion or memory problems.  Severe alcoholism. Get help right away if:  Your symptoms become severe.  You have serious thoughts about hurting yourself or others. If you ever feel like you may  hurt yourself or others, or have thoughts about taking your own life, get help right away. You can go to your nearest emergency department or call:  Your local emergency services (911 in the [place]  A suicide crisis helpline, such as the National Suicide Prevention Lifeline at 8043012552. This is open 24 hours a day. Summary  Wernicke-Korsakoff syndrome (WKS) is a brain disorder that impairs a person's mental health and causes memory loss and unstable emotions (psychosis).  This condition is caused by a lack of vitamin B1 (thiamine). That may be the result of alcoholism, poor eating habits, or certain medical conditions.  Treatment for WKS needs to start early. If the condition is diagnosed and treated in its early stages, it can be reversed. If the condition is left untreated, it can cause permanent brain damage.  Keep all follow-up visits as told by your health care provider. This information is not intended to replace advice given to you by your health care provider. Make sure you discuss any questions you have with your health care provider. Document Revised: 10/15/2017 Document Reviewed: 06/07/2017 Elsevier Patient Education  2020 [employer].

## 2020-05-13 NOTE — Assessment & Plan Note (Signed)
Severe alcohol withdrawal delirium  This is slowly improving and the patient has been detoxed from alcohol at this time and is off lorazepam  We will continue high-dose thiamine B6 B12 and folic acid

## 2020-05-14 ENCOUNTER — Telehealth: Payer: Self-pay | Admitting: Licensed Clinical Social Worker

## 2020-05-14 DIAGNOSIS — F1096 Alcohol use, unspecified with alcohol-induced persisting amnestic disorder: Secondary | ICD-10-CM

## 2020-05-14 LAB — CBC WITH DIFFERENTIAL/PLATELET
Basophils Absolute: 0.2 10*3/uL (ref 0.0–0.2)
Basos: 2 %
EOS (ABSOLUTE): 0.2 10*3/uL (ref 0.0–0.4)
Eos: 2 %
Hematocrit: 38.4 % (ref 37.5–51.0)
Hemoglobin: 12.9 g/dL — ABNORMAL LOW (ref 13.0–17.7)
Immature Grans (Abs): 0 10*3/uL (ref 0.0–0.1)
Immature Granulocytes: 1 %
Lymphocytes Absolute: 2.4 10*3/uL (ref 0.7–3.1)
Lymphs: 27 %
MCH: 35.2 pg — ABNORMAL HIGH (ref 26.6–33.0)
MCHC: 33.6 g/dL (ref 31.5–35.7)
MCV: 105 fL — ABNORMAL HIGH (ref 79–97)
Monocytes Absolute: 1 10*3/uL — ABNORMAL HIGH (ref 0.1–0.9)
Monocytes: 11 %
Neutrophils Absolute: 5.2 10*3/uL (ref 1.4–7.0)
Neutrophils: 57 %
Platelets: 487 10*3/uL — ABNORMAL HIGH (ref 150–450)
RBC: 3.66 x10E6/uL — ABNORMAL LOW (ref 4.14–5.80)
RDW: 12.4 % (ref 11.6–15.4)
WBC: 8.9 10*3/uL (ref 3.4–10.8)

## 2020-05-14 LAB — COMPREHENSIVE METABOLIC PANEL
ALT: 35 IU/L (ref 0–44)
AST: 51 IU/L — ABNORMAL HIGH (ref 0–40)
Albumin/Globulin Ratio: 1.2 (ref 1.2–2.2)
Albumin: 4.1 g/dL (ref 4.0–5.0)
Alkaline Phosphatase: 116 IU/L (ref 44–121)
BUN/Creatinine Ratio: 20 (ref 9–20)
BUN: 10 mg/dL (ref 6–24)
Bilirubin Total: 1 mg/dL (ref 0.0–1.2)
CO2: 25 mmol/L (ref 20–29)
Calcium: 10.4 mg/dL — ABNORMAL HIGH (ref 8.7–10.2)
Chloride: 98 mmol/L (ref 96–106)
Creatinine, Ser: 0.51 mg/dL — ABNORMAL LOW (ref 0.76–1.27)
GFR calc Af Amer: 147 mL/min/{1.73_m2} (ref >59)
GFR calc non Af Amer: 127 mL/min/{1.73_m2} (ref >59)
Globulin, Total: 3.4 g/dL (ref 1.5–4.5)
Glucose: 113 mg/dL — ABNORMAL HIGH (ref 65–99)
Potassium: 4.6 mmol/L (ref 3.5–5.2)
Sodium: 136 mmol/L (ref 134–144)
Total Protein: 7.5 g/dL (ref 6.0–8.5)

## 2020-05-14 NOTE — Telephone Encounter (Signed)
Call placed to patient regarding [facility] referral. LCSW spoke with both patient and pt's spouse, Dylan Snyder. Family shared that patient can become irritable and confused "at times", noting that Ativan was helpful when prescribed at hospital.   Pt has an upcoming appointment with [facility] on October 26, 21 to address medication management and therapy. LCSW discussed benefits of applying for CAFA to assist with medical coverage.   Pt scheduled an appointment with [facility] for Dec 30, 21. Family is requesting for PCP to make referral to [facility], in an attempt to have an appointment sooner. Please advise

## 2020-05-15 NOTE — Telephone Encounter (Signed)
Referral sent to [facility].  

## 2020-05-15 NOTE — Addendum Note (Signed)
Addended by: Shan Levans E on: 05/15/2020 01:31 PM   Modules accepted: Orders

## 2020-05-17 ENCOUNTER — Telehealth: Payer: Self-pay | Admitting: Critical Care Medicine

## 2020-05-17 NOTE — Telephone Encounter (Signed)
Let pt wife know neurology and Physical Medicine declined his case saying they do not have anything to offer than referral to Physical therapy which is what I already have done

## 2020-05-17 NOTE — Telephone Encounter (Signed)
Pt wife would like a callback and she would like to know why neurology decline his case

## 2020-05-17 NOTE — Telephone Encounter (Signed)
Patient was called and informed of Dr.Wright's response and he was informed that PT should reach out to him to set up an appointment.

## 2020-05-17 NOTE — Telephone Encounter (Unsigned)
Copied from CRM [id number]. Topic: General - Other >> May 17, 2020  3:15 PM Leafy Ro wrote: Reason for CRM: pt wife is calling and would like to set up a financial appt with carlos

## 2020-05-20 ENCOUNTER — Telehealth: Payer: Self-pay | Admitting: Licensed Clinical Social Worker

## 2020-05-20 NOTE — Telephone Encounter (Signed)
Call placed to patient. Patient was informed that he will need to complete new application for [employer]. Pt verbalized understanding and provided consent for LCSW to mail application to address on file. No additional concerns noted.

## 2020-05-20 NOTE — Telephone Encounter (Signed)
Call placed to pt. LCSW informed pt PCP completed referral to [facility], per request. Pt was appreciative for the information and states that he is awaiting a call from [place]. Message routed to [employer].

## 2020-05-20 NOTE — Telephone Encounter (Signed)
Call was placed and VM did not pick up to leave a message.

## 2020-05-21 NOTE — Telephone Encounter (Signed)
Pt was call and inform that he need to repply for the CAFA and [facility] his appt is 05/31/20

## 2020-05-24 ENCOUNTER — Telehealth: Payer: Self-pay

## 2020-05-24 MED FILL — AMLODIPINE BESYLATE 10 MG T: 10 | 30 days supply | Qty: 30 | Fill #0

## 2020-05-24 NOTE — Telephone Encounter (Signed)
Copied from CRM [id number]. Topic: General - Inquiry >> May 20, 2020 11:24 AM Adrian Prince D wrote: Reason for CRM: Patients wife called and would like a call back from [place] to sign up for financial assistance. She can be reached at [phone]. Please advise
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
## 2020-05-25 ENCOUNTER — Encounter: Payer: Self-pay | Admitting: Neurology

## 2020-05-25 ENCOUNTER — Ambulatory Visit (INDEPENDENT_AMBULATORY_CARE_PROVIDER_SITE_OTHER): Payer: Self-pay | Admitting: Neurology

## 2020-05-25 ENCOUNTER — Other Ambulatory Visit: Payer: Self-pay

## 2020-05-25 ENCOUNTER — Ambulatory Visit (INDEPENDENT_AMBULATORY_CARE_PROVIDER_SITE_OTHER): Payer: Medicaid Other | Admitting: Behavioral Health

## 2020-05-25 VITALS — BP 128/82 | HR 126 | Ht 74.0 in | Wt 189.8 lb

## 2020-05-25 DIAGNOSIS — F1096 Alcohol use, unspecified with alcohol-induced persisting amnestic disorder: Secondary | ICD-10-CM

## 2020-05-25 DIAGNOSIS — F1020 Alcohol dependence, uncomplicated: Secondary | ICD-10-CM | POA: Diagnosis not present

## 2020-05-25 DIAGNOSIS — F32A Depression, unspecified: Secondary | ICD-10-CM

## 2020-05-25 DIAGNOSIS — F10231 Alcohol dependence with withdrawal delirium: Secondary | ICD-10-CM | POA: Diagnosis not present

## 2020-05-25 DIAGNOSIS — F10931 Alcohol use, unspecified with withdrawal delirium: Secondary | ICD-10-CM

## 2020-05-25 NOTE — Patient Instructions (Signed)
Wernicke-Korsakoff Syndrome  Wernicke-Korsakoff syndrome (WKS) is a brain disorder that impairs a person's mental health and causes memory loss and unstable emotions (psychosis). The term actually refers to two conditions that often occur together. Wernicke encephalopathy happens first. If the disease gets worse, Wernicke encephalopathy is followed by Korsakoff syndrome (Korsakoff psychosis). It is caused by not getting enough vitamin B1 (thiamine). This condition can permanently damage the areas of the brain that are responsible for memory. Wernicke encephalopathy is a medical emergency. What are the causes? This condition is caused by a lack of vitamin B1 (thiamine). This deficiency can result from:  Alcoholism. This is the most common cause.  Poor nutrition.  Certain medical conditions.  Bariatric or gastrointestinal surgery. What increases the risk? The following factors may make you more likely to develop this condition:  Having severe alcoholism for 5 years or longer.  Poor nutrition.  AIDS (acquired immunodeficiency syndrome).  Long-term (chronic) infections.  Failing to eat properly because of fear of weight gain (anorexia nervosa).  Kidney dialysis.  Advanced cancer. What are the signs or symptoms? Symptoms of this condition may vary based on the stage of the disease. Symptoms of Wernicke encephalopathy include:  Uncoordinated muscles.  Abnormal eye movements.  Confusion.  Loss of other mental abilities.  Double vision.  Drooping eyelids.  Symptoms of alcohol withdrawal. As the disease gets worse, symptoms of Korsakoff syndrome may develop. These include:  Severe memory loss.  Inability to form new memories.  Seeing, hearing, tasting, smelling, or feeling things that are not real (hallucinations).  Tendency to make up stories. How is this diagnosed? This condition may be diagnosed based on:  Your symptoms and medical history. Your health care  provider may suspect this condition if you abuse alcohol.  A physical exam.  Blood tests to check your thiamine level and to look for other signs of malnutrition.  Tests to check for memory loss.  Imaging studies to look for changes in the brain and other body parts. These may include MRI and CT scans. How is this treated? This condition may be treated with:  Thiamine replacement therapy. You may be given thiamine through an IV or by mouth.  Treatment for alcoholism.  Treatment for other nutritional problems.  Medicine and mental health counseling for chronic dementia if appropriate. Treatment for this condition needs to start early. If the condition is diagnosed and treated in the early stages, it can be reversed. If the condition is left untreated, it can cause permanent brain damage. Follow these instructions at home:  Take over-the-counter and prescription medicines only as told by your health care provider.  Do not drink alcohol. Get treatment for alcoholism if needed.  Eat a nutritious, well-balanced diet. ? Be sure to include plenty of vegetables, fruits, low-fat dairy products, and lean protein. ? Limit foods that are high in solid fats, added sugars, or salt.  Get regular exercise. ? Most adults should exercise for at least 150 minutes each week. The exercise should increase your heart rate and make you sweat (moderate-intensity exercise). ? Most adults should also do strengthening exercises at least twice a week. This is in addition to the moderate-intensity exercise.  Get caregiver support if needed.  Keep all follow-up visits as told by your health care provider. This is important. Visits may include sessions for mental health counseling, or meeting with a nutrition specialist (dietitian). Contact a health care provider if you have:  Confusion or memory problems.  Severe alcoholism. Get help right  away if:  Your symptoms become severe.  You have serious  thoughts about hurting yourself or others. If you ever feel like you may hurt yourself or others, or have thoughts about taking your own life, get help right away. You can go to your nearest emergency department or call:  Your local emergency services (911 in the [place]  A suicide crisis helpline, such as the National Suicide Prevention Lifeline at 320-241-8594. This is open 24 hours a day. Summary  Wernicke-Korsakoff syndrome (WKS) is a brain disorder that impairs a person's mental health and causes memory loss and unstable emotions (psychosis).  This condition is caused by a lack of vitamin B1 (thiamine). That may be the result of alcoholism, poor eating habits, or certain medical conditions.  Treatment for WKS needs to start early. If the condition is diagnosed and treated in its early stages, it can be reversed. If the condition is left untreated, it can cause permanent brain damage.  Keep all follow-up visits as told by your health care provider. This information is not intended to replace advice given to you by your health care provider. Make sure you discuss any questions you have with your health care provider. Document Revised: 10/15/2017 Document Reviewed: 06/07/2017 Elsevier Patient Education  2020 [employer].

## 2020-05-25 NOTE — Telephone Encounter (Signed)
Pt was call and inform that he has an appt on 05/31/20 to please bring the application as well all the documentation he need to qualified
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
## 2020-05-31 ENCOUNTER — Ambulatory Visit: Payer: Medicaid Other | Attending: Critical Care Medicine

## 2020-05-31 ENCOUNTER — Other Ambulatory Visit: Payer: Self-pay

## 2020-05-31 ENCOUNTER — Telehealth: Payer: Self-pay | Admitting: Critical Care Medicine

## 2020-05-31 NOTE — Telephone Encounter (Signed)
Noted, will process

## 2020-05-31 NOTE — Telephone Encounter (Signed)
Patient dropped off handicap placard and FMLA paperwork.

## 2020-06-01 ENCOUNTER — Telehealth: Payer: Self-pay

## 2020-06-01 ENCOUNTER — Other Ambulatory Visit: Payer: Self-pay | Admitting: Critical Care Medicine

## 2020-06-01 ENCOUNTER — Ambulatory Visit (HOSPITAL_COMMUNITY): Payer: No Payment, Other | Admitting: Psychiatry

## 2020-06-01 MED ORDER — GABAPENTIN 300 MG PO CAPS
600.0000 mg | ORAL_CAPSULE | Freq: Three times a day (TID) | ORAL | 0 refills | Status: DC
Start: 2020-06-01 — End: 2020-07-14

## 2020-06-01 MED FILL — GABAPENTIN 300 MG CAPSULE: 300 | 30 days supply | Qty: 180 | Fill #0

## 2020-06-01 NOTE — Telephone Encounter (Signed)
Pt verified. Spoke w/pt spouse Chantel to advise that FMLA and handicap placard forms are ready for pickup.

## 2020-06-07 MED FILL — XARELTO 20 MG TABLET: 20 | 30 days supply | Qty: 30 | Fill #2

## 2020-06-10 MED FILL — GABAPENTIN 300 MG CAPSULE: 300 | 30 days supply | Qty: 180 | Fill #0

## 2020-06-11 ENCOUNTER — Ambulatory Visit: Payer: Self-pay | Admitting: Neurology

## 2020-06-16 ENCOUNTER — Ambulatory Visit: Payer: Self-pay | Admitting: Critical Care Medicine

## 2020-06-16 ENCOUNTER — Ambulatory Visit (HOSPITAL_COMMUNITY): Payer: No Payment, Other | Admitting: Psychiatry

## 2020-06-18 ENCOUNTER — Telehealth: Payer: Self-pay | Admitting: Critical Care Medicine

## 2020-06-18 NOTE — Telephone Encounter (Signed)
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  Pt states it wears off and he has to take early.  Please advise  [facility] - [place], [place] - [address] Phone:  [phone]  Fax:  [phone]

## 2020-06-18 NOTE — Telephone Encounter (Signed)
This must be cleared by patient's PCP. He is out of office, will send to covering provider. It is likely that the patient will need to be seen first.

## 2020-06-20 NOTE — Telephone Encounter (Signed)
He is already on a huge dose of Gabapentin. I would recommend an office visit prior to dose increase.

## 2020-06-22 ENCOUNTER — Other Ambulatory Visit: Payer: Self-pay | Admitting: Critical Care Medicine

## 2020-06-22 DIAGNOSIS — M5416 Radiculopathy, lumbar region: Secondary | ICD-10-CM

## 2020-06-22 DIAGNOSIS — R2681 Unsteadiness on feet: Secondary | ICD-10-CM

## 2020-06-22 DIAGNOSIS — F1096 Alcohol use, unspecified with alcohol-induced persisting amnestic disorder: Secondary | ICD-10-CM

## 2020-06-28 ENCOUNTER — Other Ambulatory Visit: Payer: Self-pay

## 2020-06-28 ENCOUNTER — Encounter: Payer: Self-pay | Admitting: Physical Therapy

## 2020-06-28 ENCOUNTER — Ambulatory Visit: Payer: Medicaid Other | Attending: Critical Care Medicine | Admitting: Physical Therapy

## 2020-06-28 DIAGNOSIS — R2689 Other abnormalities of gait and mobility: Secondary | ICD-10-CM | POA: Insufficient documentation

## 2020-06-28 DIAGNOSIS — R2681 Unsteadiness on feet: Secondary | ICD-10-CM | POA: Diagnosis present

## 2020-06-28 DIAGNOSIS — R278 Other lack of coordination: Secondary | ICD-10-CM | POA: Diagnosis present

## 2020-06-28 DIAGNOSIS — M6281 Muscle weakness (generalized): Secondary | ICD-10-CM | POA: Insufficient documentation

## 2020-06-28 MED FILL — AMLODIPINE BESYLATE 10 MG T: 10 | 30 days supply | Qty: 30 | Fill #1

## 2020-06-28 MED FILL — PANTOPRAZOLE SOD DR 40 MG T: 40 | 30 days supply | Qty: 30 | Fill #2
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
## 2020-07-13 ENCOUNTER — Other Ambulatory Visit: Payer: Self-pay

## 2020-07-13 ENCOUNTER — Ambulatory Visit: Payer: Medicaid Other | Attending: Critical Care Medicine | Admitting: Physical Therapy

## 2020-07-13 DIAGNOSIS — M6281 Muscle weakness (generalized): Secondary | ICD-10-CM | POA: Diagnosis present

## 2020-07-13 DIAGNOSIS — R2681 Unsteadiness on feet: Secondary | ICD-10-CM | POA: Diagnosis present

## 2020-07-13 DIAGNOSIS — R278 Other lack of coordination: Secondary | ICD-10-CM | POA: Diagnosis present

## 2020-07-13 DIAGNOSIS — R2689 Other abnormalities of gait and mobility: Secondary | ICD-10-CM | POA: Diagnosis not present

## 2020-07-13 NOTE — Patient Instructions (Signed)
Access Code: [facility] URL: [email or web address] Date: 07/13/2020 Prepared by: Vernon Prey April Kirstie Peri  Exercises Tandem Stance with Head Rotation - 1 x daily - 7 x weekly - 2 sets - 30 sec hold Tandem Stance with Head Nods - 1 x daily - 7 x weekly - 2 sets - 30 sec hold Standing Marching - 1 x daily - 7 x weekly - 2 sets - 10 reps Side Stepping with Resistance at Ankles - 1 x daily - 7 x weekly - 2 sets - 10 reps Forward Monster Walks - 1 x daily - 7 x weekly - 2 sets - 10 reps Backward Monster Walks - 1 x daily - 7 x weekly - 2 sets - 10 reps
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
## 2020-07-14 ENCOUNTER — Other Ambulatory Visit: Payer: Self-pay | Admitting: Critical Care Medicine

## 2020-07-14 ENCOUNTER — Other Ambulatory Visit: Payer: Self-pay

## 2020-07-14 ENCOUNTER — Encounter: Payer: Self-pay | Admitting: Critical Care Medicine

## 2020-07-14 ENCOUNTER — Ambulatory Visit: Payer: Medicaid Other | Attending: Critical Care Medicine | Admitting: Critical Care Medicine

## 2020-07-14 VITALS — BP 127/83 | HR 85 | Temp 97.8°F | Resp 18 | Ht 74.0 in | Wt 193.2 lb

## 2020-07-14 DIAGNOSIS — Z789 Other specified health status: Secondary | ICD-10-CM

## 2020-07-14 DIAGNOSIS — I825Y1 Chronic embolism and thrombosis of unspecified deep veins of right proximal lower extremity: Secondary | ICD-10-CM | POA: Insufficient documentation

## 2020-07-14 DIAGNOSIS — Z716 Tobacco abuse counseling: Secondary | ICD-10-CM | POA: Diagnosis not present

## 2020-07-14 DIAGNOSIS — S71131S Puncture wound without foreign body, right thigh, sequela: Secondary | ICD-10-CM | POA: Insufficient documentation

## 2020-07-14 DIAGNOSIS — I10 Essential (primary) hypertension: Secondary | ICD-10-CM

## 2020-07-14 DIAGNOSIS — Z1159 Encounter for screening for other viral diseases: Secondary | ICD-10-CM | POA: Diagnosis not present

## 2020-07-14 DIAGNOSIS — X58XXXS Exposure to other specified factors, sequela: Secondary | ICD-10-CM | POA: Diagnosis not present

## 2020-07-14 DIAGNOSIS — Z7901 Long term (current) use of anticoagulants: Secondary | ICD-10-CM | POA: Diagnosis not present

## 2020-07-14 DIAGNOSIS — R41844 Frontal lobe and executive function deficit: Secondary | ICD-10-CM | POA: Insufficient documentation

## 2020-07-14 DIAGNOSIS — Z7289 Other problems related to lifestyle: Secondary | ICD-10-CM

## 2020-07-14 DIAGNOSIS — K7010 Alcoholic hepatitis without ascites: Secondary | ICD-10-CM

## 2020-07-14 DIAGNOSIS — F1026 Alcohol dependence with alcohol-induced persisting amnestic disorder: Secondary | ICD-10-CM | POA: Diagnosis present

## 2020-07-14 DIAGNOSIS — I82511 Chronic embolism and thrombosis of right femoral vein: Secondary | ICD-10-CM

## 2020-07-14 DIAGNOSIS — F1096 Alcohol use, unspecified with alcohol-induced persisting amnestic disorder: Secondary | ICD-10-CM | POA: Diagnosis not present

## 2020-07-14 DIAGNOSIS — K219 Gastro-esophageal reflux disease without esophagitis: Secondary | ICD-10-CM

## 2020-07-14 DIAGNOSIS — F17210 Nicotine dependence, cigarettes, uncomplicated: Secondary | ICD-10-CM | POA: Diagnosis not present

## 2020-07-14 DIAGNOSIS — F1090 Alcohol use, unspecified, uncomplicated: Secondary | ICD-10-CM

## 2020-07-14 DIAGNOSIS — Z79899 Other long term (current) drug therapy: Secondary | ICD-10-CM | POA: Insufficient documentation

## 2020-07-14 DIAGNOSIS — F17200 Nicotine dependence, unspecified, uncomplicated: Secondary | ICD-10-CM

## 2020-07-14 DIAGNOSIS — R269 Unspecified abnormalities of gait and mobility: Secondary | ICD-10-CM | POA: Insufficient documentation

## 2020-07-14 MED ORDER — RIVAROXABAN 20 MG PO TABS: 20.0000 mg | ORAL_TABLET | Freq: Every day | ORAL | 4 refills | Status: DC

## 2020-07-14 MED ORDER — METOPROLOL TARTRATE 50 MG PO TABS: 50.0000 mg | ORAL_TABLET | Freq: Two times a day (BID) | ORAL | 3 refills | Status: DC

## 2020-07-14 MED ORDER — AMLODIPINE BESYLATE 10 MG PO TABS
10.0000 mg | ORAL_TABLET | Freq: Every day | ORAL | 6 refills | Status: DC
Start: 2020-07-14 — End: 2020-08-09

## 2020-07-14 MED ORDER — PANTOPRAZOLE SODIUM 40 MG PO TBEC
40.0000 mg | DELAYED_RELEASE_TABLET | Freq: Every day | ORAL | 3 refills | Status: DC
Start: 2020-07-14 — End: 2020-10-14

## 2020-07-14 MED ORDER — GABAPENTIN 300 MG PO CAPS: 600.0000 mg | ORAL_CAPSULE | Freq: Three times a day (TID) | ORAL | 0 refills | Status: DC

## 2020-07-14 MED FILL — XARELTO 20 MG TABLET: 20 | 30 days supply | Qty: 30 | Fill #3

## 2020-07-14 MED FILL — GABAPENTIN 300 MG CAPSULE: 300 | 30 days supply | Qty: 180 | Fill #0

## 2020-07-14 MED FILL — METOPROLOL TARTRATE 50 MG T: 50 | 30 days supply | Qty: 60 | Fill #0

## 2020-07-14 NOTE — Progress Notes (Signed)
Needs RF on gabapentin

## 2020-07-14 NOTE — Assessment & Plan Note (Signed)
Continue Xarelto for life with inferior vena cava filter in place

## 2020-07-14 NOTE — Assessment & Plan Note (Signed)
Continue beta-blocker and amlodipine

## 2020-07-14 NOTE — Assessment & Plan Note (Signed)
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

## 2020-07-14 NOTE — Assessment & Plan Note (Signed)
Follow-up liver function profile 

## 2020-07-14 NOTE — Assessment & Plan Note (Signed)
Continue Protonix °

## 2020-07-14 NOTE — Patient Instructions (Signed)
Referral to Occupational Therapy will be made  No change in your medications refills sent to our pharmacy  Complete lab screen is obtained today including hepatitis C and liver function  Continue to abstain from all alcohol  Follow a brain health protocol as outlined below to improve your brain function  Continue all your current vitamin supplements and get a men's multivitamin over 50  Do puzzles to exercise your brain.      Return to Dr. Delford Field February  Memory Compensation Strategies  1. Use "WARM" strategy.  W= write it down  A= associate it  R= repeat it  M= make a mental note  2.   You can keep a Glass blower/designer.  Use a 3-ring notebook with sections for the following: calendar, important names and phone numbers,  medications, doctors' names/phone numbers, lists/reminders, and a section to journal what you did  each day.   3.    Use a calendar to write appointments down.  4.    Write yourself a schedule for the day.  This can be placed on the calendar or in a separate section of the Memory Notebook.  Keeping a  regular schedule can help memory.  5.    Use medication organizer with sections for each day or morning/evening pills.  You may need help loading it  6.    Keep a basket, or pegboard by the door.  Place items that you need to take out with you in the basket or on the pegboard.  You may also want to  include a message board for reminders.  7.    Use sticky notes.  Place sticky notes with reminders in a place where the task is performed.  For example: " turn off the  stove" placed by the stove, "lock the door" placed on the door at eye level, " take your medications" on  the bathroom mirror or by the place where you normally take your medications.  8.    Use alarms/timers.  Use while cooking to remind yourself to check on food or as a reminder to take your medicine, or as a  reminder to make a call, or as a reminder to perform another task, etc.  Eating Plan for  Brain Health A healthy diet is an important part of overall wellness, and it may help to improve brain function. Eating a healthy diet can lower the risk of having memory-loss diseases such as Alzheimer disease or dementia, or it can slow down the progression of those diseases. Depending on your overall health and any other health conditions you have, you may need to follow specific diet guidelines. Work with your health care provider or diet and nutrition specialist (dietitian) to find and follow an eating plan that is right for you. What are tips for following this plan? Reading food labels  Check food labels for Daily Value (DV) percentages. Aim for a DV of 5% or less for: ? Saturated fats. ? Trans fats. ? Cholesterol. ? Salt (sodium).  Choose whole grains instead of processed grains. The first ingredient in the ingredient list should include words like "whole grain" or "whole wheat." Shopping  Before you go grocery shopping, plan your meals and make a shopping list.  Look for lean meats, fish, low-fat dairy products, fruits and vegetables, and whole grains.  Avoid buying processed or pre-made foods. These are higher in added sugar, fat, and sodium. Cooking  Use healthy oils such as olive oil, instead of butter or margarine.  Avoid frying foods. Healthier ways of cooking include roasting, baking, poaching, and steaming.  Keep in mind that many healthy foods can be prepared without cooking, such as tuna, nuts, beans, and fruits. Meal planning  Plan to eat one or more servings of each of these every day: ? Green leafy vegetables. ? Nuts. ? Whole grains.  Plan to eat berries, beans, fish, and poultry two or more times every week.  Avoid red meats, butter, cheese, sweets, and fried foods.  Eat several small meals throughout the day. For example, eat 6 small meals rather than 3 full-size meals. General tips  If you have difficulty chewing or swallowing: ? Choose foods that are  tender, soft, and moist. ? Avoid foods that are sticky, hard, dry, chewy, or crunchy. ? Cut foods into pieces that are smaller than your thumbnail.  If you are underweight: ? Add extra protein or calories to meals by adding cream, nut butters, or protein powder to foods and drinks. ? Drink nutritional supplement shakes as told by your health care provider or dietitian.  Drink plenty of fluids to keep your urine pale yellow.  Limit alcohol intake to no more than 1 drink a day for nonpregnant women and 2 drinks a day for men. One drink equals 12 oz of beer, 5 oz of wine, or 1 oz of hard liquor.  Take daily vitamin and mineral supplements as told by your health care provider or dietitian.  Follow daily calorie and nutrient intake goals as told by your dietitian. What foods are recommended?   Foods that are high in omega-3s (omega-3 fatty acids). Omega-3s are often found in coldwater fish. They are also found in ground flaxseed, walnuts, edamame, and seaweed. ? It is recommended that adults eat 8 oz (230 g) or more of fish or other seafood every week. ? The best kinds of fish for omega-3s are wild salmon, albacore, tuna, sardines, and farmed trout. ? It is important to bake or grill fish instead of frying it, to avoid unhealthy fats.  Foods that contain vitamins C, D, and E. These foods include: ? Avocados. ? Beans. ? Nuts and seeds. ? Green leafy vegetables, like spinach and kale. ? Cruciferous vegetables, like broccoli or Brussels sprouts.  Cherries and berries, especially blackberries and blueberries. Berries have antioxidants and nutrients that support memory.  Whole grains, such as oatmeal, whole-grain cereal, whole-grain bread, brown rice, and barley soup.  Herbs and spices. Cooking with certain herbs and spices, such as turmeric, can help you absorb vitamins.  Foods in the Mediterranean diet or the DASH (Dietary Approaches to Stop Hypertension) diet. Your health care provider  may recommend eating a combination of these diets. These diets recommend that you: ? Eat green leafy vegetables, nuts, and whole grains every day. ? Drink one glass of wine a day. ? Eat berries, beans, fish, and poultry two or more times every week. ? Limit red meats, butter, cheese, sweets, fried foods, and fast food to once a week or less.  Healthy breakfast foods, such as whole-grain cereal, low-fat yogurt, berries or vegetables, and nuts. Eating breakfast every day can boost brain function and concentration for people of all ages. What foods are not recommended?  Foods that are high in trans fats, including: ? Fried foods. ? Snack foods. ? Pastries like cookies and donuts, especially the ones that come prepackaged.  Foods that are high in saturated fats, including: ? Processed meats, like sausage or deli meat. ? Red meat. ?  Certain dairy products like butter, margarine, and certain cheeses.  Processed grains, such as white bread.  Sweets and fast food. Limit these types of food to once a week or less. Summary  Eating a nutritious diet can support brain health as well as overall wellness.  Choose foods that are high in omega-3 fatty acids, vitamins, and whole grains.  Avoid foods that contain trans fats and saturated fats, and limit sweets and fast food. This information is not intended to replace advice given to you by your health care provider. Make sure you discuss any questions you have with your health care provider. Document Revised: 11/07/2018 Document Reviewed: 11/20/2016 [employer]vier Patient Education  2020 [employer].

## 2020-07-14 NOTE — Progress Notes (Signed)
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 the patient has  significant gait disturbance with positive Romberg sign  Skin: Warm, no lesions or rashes  CT head 9/29: CT Head Wo Contrast  Result Date: 04/28/2020 CLINICAL DATA: Alcohol withdrawal EXAM: CT HEAD WITHOUT CONTRAST TECHNIQUE: Contiguous axial images were obtained from the base of the skull through the vertex without intravenous contrast. COMPARISON: None. FINDINGS: Brain: No evidence of acute infarction, hemorrhage, hydrocephalus, extra-axial collection or mass lesion/mass effect. Mild diffuse cerebral atrophy. Vascular: No hyperdense vessel or unexpected calcification. Skull: Calvarium appears intact. Sinuses/Orbits: Paranasal sinuses and mastoid air cells are clear. Other: None. IMPRESSION: 1. No acute intracranial abnormalities. 2. Mild diffuse cerebral atrophy. Electronically Signed By: Burman NievesWilliam Stevens M.D. On: 04/28/2020 06:08    8/22 MRI Lumbar: IMPRESSION: No stenosis.  L3-4: Annular fissure in the right extraforaminal region adjacent to the right L3 nerve. No nerve compression. Nerve irritation is possible.  L4-5: Minimal disc bulge and facet prominence. No stenosis. The facet arthritis could be associated with low back  pain.  L5-S1: Central annular fissures and annular bulging, adjacent to the S1 nerves. No nerve compression, but neural irritation could occur. 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     Assessment & Plan:  I personally reviewed all images and lab data [facility] as well as any outside material available during this office visit and agree with the  radiology impressions.   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 Current smoking consumption amount: Half a pack a day  . Dicsussion on advise to quit smoking and smoking impacts: Cardiovascular lung health  . Patient's willingness to quit: Interested in quitting  . Methods to quit smoking discussed: Behavioral modification  . Medication management of smoking session drugs discussed: Nicotine replacement  . Resources provided:  AVS   . Setting quit date not established  . Follow-up arranged 1 month   Time spent counseling the patient: 5 minutes     Thereasa DistanceRodney was seen today for follow-up.  Diagnoses and all orders for this visit:  Wernicke-Korsakoff syndrome (alcoholic) (HCC) -     Ambulatory referral to Physical Therapy  Essential hypertension -     Comprehensive metabolic panel -     CBC with Differential/Platelet  Alcoholic hepatitis without  ascites  Need for hepatitis C screening test -     HCV Ab w Reflex to Quant PCR  Chronic deep vein thrombosis (DVT) of femoral vein of right lower extremity (HCC)  Gastroesophageal reflux disease without esophagitis  Alcohol use  Tobacco use disorder  Other orders -     gabapentin (NEURONTIN) 300 MG capsule; Take 2 capsules (600 mg total) by mouth 3 (three) times daily. -     metoprolol tartrate (LOPRESSOR) 50 MG tablet; Take 1 tablet (50 mg total) by mouth 2 (two) times daily. -     pantoprazole (PROTONIX) 40 MG tablet; Take 1 tablet (40 mg total) by mouth daily. -     rivaroxaban (XARELTO)  20 MG TABS tablet; Take 1 tablet (20 mg total) by mouth daily with supper. Begin when starter pak complete -     amLODipine (NORVASC) 10 MG tablet; Take 1 tablet (10 mg total) by mouth daily.

## 2020-07-14 NOTE — Assessment & Plan Note (Signed)
Patient is currently abstinent °

## 2020-07-14 NOTE — Assessment & Plan Note (Signed)
  .   Current smoking consumption amount: Half a pack a day  . Dicsussion on advise to quit smoking and smoking impacts: Cardiovascular lung health  . Patient's willingness to quit: Interested in quitting  . Methods to quit smoking discussed: Behavioral modification  . Medication management of smoking session drugs discussed: Nicotine replacement  . Resources provided:  AVS   . Setting quit date not established  . Follow-up arranged 1 month   Time spent counseling the patient: 5 minutes   

## 2020-07-15 LAB — COMPREHENSIVE METABOLIC PANEL
ALT: 11 IU/L (ref 0–44)
AST: 21 IU/L (ref 0–40)
Albumin/Globulin Ratio: 1.5 (ref 1.2–2.2)
Albumin: 4.4 g/dL (ref 4.0–5.0)
Alkaline Phosphatase: 71 IU/L (ref 44–121)
BUN/Creatinine Ratio: 14 (ref 9–20)
BUN: 10 mg/dL (ref 6–24)
Bilirubin Total: 0.4 mg/dL (ref 0.0–1.2)
CO2: 23 mmol/L (ref 20–29)
Calcium: 10.7 mg/dL — ABNORMAL HIGH (ref 8.7–10.2)
Chloride: 97 mmol/L (ref 96–106)
Creatinine, Ser: 0.71 mg/dL — ABNORMAL LOW (ref 0.76–1.27)
GFR calc Af Amer: 128 mL/min/{1.73_m2} (ref >59)
GFR calc non Af Amer: 111 mL/min/{1.73_m2} (ref >59)
Globulin, Total: 3 g/dL (ref 1.5–4.5)
Glucose: 98 mg/dL (ref 65–99)
Potassium: 4.6 mmol/L (ref 3.5–5.2)
Sodium: 136 mmol/L (ref 134–144)
Total Protein: 7.4 g/dL (ref 6.0–8.5)

## 2020-07-15 LAB — CBC WITH DIFFERENTIAL/PLATELET
Basophils Absolute: 0.1 10*3/uL (ref 0.0–0.2)
Basos: 1 %
EOS (ABSOLUTE): 0.2 10*3/uL (ref 0.0–0.4)
Eos: 2 %
Hematocrit: 41.3 % (ref 37.5–51.0)
Hemoglobin: 14.4 g/dL (ref 13.0–17.7)
Immature Grans (Abs): 0.1 10*3/uL (ref 0.0–0.1)
Immature Granulocytes: 1 %
Lymphocytes Absolute: 2.9 10*3/uL (ref 0.7–3.1)
Lymphs: 28 %
MCH: 31.9 pg (ref 26.6–33.0)
MCHC: 34.9 g/dL (ref 31.5–35.7)
MCV: 92 fL (ref 79–97)
Monocytes Absolute: 0.8 10*3/uL (ref 0.1–0.9)
Monocytes: 7 %
Neutrophils Absolute: 6.6 10*3/uL (ref 1.4–7.0)
Neutrophils: 61 %
Platelets: 390 10*3/uL (ref 150–450)
RBC: 4.51 x10E6/uL (ref 4.14–5.80)
RDW: 11.6 % (ref 11.6–15.4)
WBC: 10.6 10*3/uL (ref 3.4–10.8)

## 2020-07-15 LAB — HCV AB W REFLEX TO QUANT PCR: HCV Ab: <0.1 s/co ratio (ref 0.0–0.9)

## 2020-07-15 LAB — HCV INTERPRETATION

## 2020-07-20 ENCOUNTER — Other Ambulatory Visit: Payer: Self-pay

## 2020-07-20 ENCOUNTER — Ambulatory Visit: Payer: Medicaid Other | Admitting: Physical Therapy

## 2020-07-20 DIAGNOSIS — R2681 Unsteadiness on feet: Secondary | ICD-10-CM

## 2020-07-20 DIAGNOSIS — R2689 Other abnormalities of gait and mobility: Secondary | ICD-10-CM

## 2020-07-20 DIAGNOSIS — M6281 Muscle weakness (generalized): Secondary | ICD-10-CM

## 2020-07-20 DIAGNOSIS — R278 Other lack of coordination: Secondary | ICD-10-CM
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
## 2020-07-26 ENCOUNTER — Other Ambulatory Visit: Payer: Self-pay

## 2020-07-26 ENCOUNTER — Ambulatory Visit: Payer: Medicaid Other | Admitting: Physical Therapy

## 2020-07-26 DIAGNOSIS — R2689 Other abnormalities of gait and mobility: Secondary | ICD-10-CM | POA: Diagnosis not present

## 2020-07-26 DIAGNOSIS — M6281 Muscle weakness (generalized): Secondary | ICD-10-CM

## 2020-07-26 DIAGNOSIS — R2681 Unsteadiness on feet: Secondary | ICD-10-CM

## 2020-07-27 ENCOUNTER — Encounter: Payer: Medicaid Other | Admitting: Physical Therapy
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
## 2020-07-28 ENCOUNTER — Ambulatory Visit: Payer: Medicaid Other | Admitting: Physical Therapy

## 2020-07-29 ENCOUNTER — Ambulatory Visit: Payer: Self-pay | Admitting: Neurology

## 2020-08-02 ENCOUNTER — Ambulatory Visit: Payer: Self-pay | Admitting: *Deleted
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
## 2020-08-04 ENCOUNTER — Ambulatory Visit: Payer: Medicaid Other | Admitting: Occupational Therapy

## 2020-08-04 ENCOUNTER — Encounter: Payer: Self-pay | Admitting: Physical Therapy

## 2020-08-04 ENCOUNTER — Other Ambulatory Visit: Payer: Self-pay

## 2020-08-04 ENCOUNTER — Encounter: Payer: Self-pay | Admitting: Occupational Therapy

## 2020-08-04 ENCOUNTER — Ambulatory Visit: Payer: Medicaid Other | Attending: Critical Care Medicine | Admitting: Physical Therapy

## 2020-08-04 DIAGNOSIS — R41844 Frontal lobe and executive function deficit: Secondary | ICD-10-CM | POA: Diagnosis present

## 2020-08-04 DIAGNOSIS — R2681 Unsteadiness on feet: Secondary | ICD-10-CM | POA: Insufficient documentation

## 2020-08-04 DIAGNOSIS — R208 Other disturbances of skin sensation: Secondary | ICD-10-CM | POA: Diagnosis present

## 2020-08-04 DIAGNOSIS — M6281 Muscle weakness (generalized): Secondary | ICD-10-CM | POA: Diagnosis present

## 2020-08-04 DIAGNOSIS — R41840 Attention and concentration deficit: Secondary | ICD-10-CM

## 2020-08-04 DIAGNOSIS — R2689 Other abnormalities of gait and mobility: Secondary | ICD-10-CM | POA: Insufficient documentation

## 2020-08-04 DIAGNOSIS — R278 Other lack of coordination: Secondary | ICD-10-CM | POA: Diagnosis present

## 2020-08-04 MED FILL — PANTOPRAZOLE SOD DR 40 MG T: 40 | 30 days supply | Qty: 30 | Fill #3

## 2020-08-04 MED FILL — AMLODIPINE BESYLATE 10 MG T: 10 | 30 days supply | Qty: 30 | Fill #2

## 2020-08-04 MED FILL — METOPROLOL TARTRATE 50 MG T: 50 | 30 days supply | Qty: 60 | Fill #0
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
## 2020-08-04 NOTE — Telephone Encounter (Signed)
Patient name and DOB verified. Spoke to wife with patient nearby the phone. Reiterated to patient to call office to see if there are any cancellations. If symptoms worsened to SOB to go to ED. Advised to elevate legs and stay off feet as much as possible and wear support socks. Did not recommend getting compression hose since this is a prescription. Denies having heart or kidney conditions that could cause swelling in BLE. Denies blistering or weeping of BLE.   Verbalized understanding.

## 2020-08-08 NOTE — Progress Notes (Signed)
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 well-nourished, in no distress,  normal affect  ENT: Evidence of severe oral candidiasis,  oropharynx clear, no postnasal drip  Neck: No JVD, no TMG, no carotid bruits  Lungs: No use of accessory muscles, no dullness to percussion, clear without rales or rhonchi  Cardiovascular: RRR, heart sounds normal, no murmur or gallops, trace peripheral edema  Abdomen: Colostomy wound is healed without evidence of complication  Musculoskeletal: No deformities, no cyanosis or clubbing  Neuro: alert, as per neurologic exam with neurology the patient has significant gait disturbance with positive Romberg sign  Skin: Warm, no lesions or rashes  CT head 9/29: CT Head Wo Contrast  Result Date: 04/28/2020 CLINICAL DATA: Alcohol withdrawal EXAM: CT HEAD WITHOUT CONTRAST TECHNIQUE: Contiguous axial images were obtained from the base of the skull through the vertex without intravenous contrast. COMPARISON: None. FINDINGS: Brain: No evidence of acute infarction, hemorrhage, hydrocephalus, extra-axial collection or mass lesion/mass effect. Mild diffuse cerebral atrophy. Vascular: No hyperdense vessel or unexpected calcification. Skull: Calvarium appears intact. Sinuses/Orbits: Paranasal sinuses and mastoid air cells are clear. Other:  None. IMPRESSION: 1. No acute intracranial abnormalities. 2. Mild diffuse cerebral atrophy. Electronically Signed By: Burman Nieves M.D. On: 04/28/2020 06:08    8/22 MRI Lumbar: IMPRESSION: No stenosis.  L3-4: Annular fissure in the right extraforaminal region adjacent to the right L3 nerve. No nerve compression. Nerve irritation is possible.  L4-5: Minimal disc bulge and facet prominence. No stenosis. The facet arthritis could be associated with low back pain.  L5-S1: Central annular fissures and annular bulging, adjacent to the S1 nerves. No nerve compression, but neural irritation could occur. 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     Assessment & Plan:  I personally reviewed all images and lab data in the [facility] system as well as any outside material available during this office visit and agree with the  radiology impressions.   Essential hypertension Lower extremity edema likely in the basis of side effect from amlodipine  Plan here is to discontinue amlodipine and begin valsartan HCT daily and follow-up  Alcoholic hepatitis Recent labs show resolution of alcoholic hepatitis and I applauded the patient on alcohol abstinence     Wernicke-Korsakoff syndrome (alcoholic) (HCC) Improved Warnicke Korsakoff syndrome with alcohol abstinence  Tobacco use disorder    . Current smoking consumption amount: Half a pack a day  . Dicsussion on advise to quit smoking and smoking impacts: Cardiovascular lung health  . Patient's willingness to quit: Interested in quitting  . Methods to quit smoking discussed: Behavioral modification  . Medication management of smoking session drugs discussed: Nicotine replacement  . Resources provided:  AVS   . Setting quit date not established  . Follow-up arranged 1 month   Time spent counseling the patient: 5 minutes     Rossi was seen today for leg swelling.  Diagnoses and all orders for this visit:  Colon cancer  screening -     Fecal occult blood, imunochemical  Essential hypertension  Alcoholic hepatitis without ascites  Wernicke-Korsakoff syndrome (alcoholic) (HCC)  Tobacco use disorder  Other orders -     valsartan-hydrochlorothiazide (DIOVAN HCT) 80-12.5 MG tablet; Take 1 tablet by mouth daily.    In place of a colonoscopy we will obtain a fecal occult study to screen for colon cancer

## 2020-08-09 ENCOUNTER — Other Ambulatory Visit: Payer: Self-pay

## 2020-08-09 ENCOUNTER — Ambulatory Visit: Payer: Medicaid Other | Attending: Critical Care Medicine | Admitting: Critical Care Medicine

## 2020-08-09 ENCOUNTER — Encounter: Payer: Self-pay | Admitting: Critical Care Medicine

## 2020-08-09 ENCOUNTER — Other Ambulatory Visit: Payer: Self-pay | Admitting: Critical Care Medicine

## 2020-08-09 VITALS — BP 125/80 | HR 80 | Temp 98.1°F | Resp 16 | Wt 194.0 lb

## 2020-08-09 DIAGNOSIS — S32501D Unspecified fracture of right pubis, subsequent encounter for fracture with routine healing: Secondary | ICD-10-CM | POA: Insufficient documentation

## 2020-08-09 DIAGNOSIS — F1010 Alcohol abuse, uncomplicated: Secondary | ICD-10-CM | POA: Diagnosis not present

## 2020-08-09 DIAGNOSIS — N3000 Acute cystitis without hematuria: Secondary | ICD-10-CM | POA: Insufficient documentation

## 2020-08-09 DIAGNOSIS — S31030S Puncture wound without foreign body of lower back and pelvis without penetration into retroperitoneum, sequela: Secondary | ICD-10-CM | POA: Insufficient documentation

## 2020-08-09 DIAGNOSIS — I82401 Acute embolism and thrombosis of unspecified deep veins of right lower extremity: Secondary | ICD-10-CM | POA: Diagnosis present

## 2020-08-09 DIAGNOSIS — Z716 Tobacco abuse counseling: Secondary | ICD-10-CM | POA: Diagnosis not present

## 2020-08-09 DIAGNOSIS — K7010 Alcoholic hepatitis without ascites: Secondary | ICD-10-CM | POA: Diagnosis not present

## 2020-08-09 DIAGNOSIS — E876 Hypokalemia: Secondary | ICD-10-CM | POA: Diagnosis not present

## 2020-08-09 DIAGNOSIS — Z7901 Long term (current) use of anticoagulants: Secondary | ICD-10-CM | POA: Diagnosis not present

## 2020-08-09 DIAGNOSIS — Z736 Limitation of activities due to disability: Secondary | ICD-10-CM | POA: Diagnosis not present

## 2020-08-09 DIAGNOSIS — F1096 Alcohol use, unspecified with alcohol-induced persisting amnestic disorder: Secondary | ICD-10-CM | POA: Diagnosis not present

## 2020-08-09 DIAGNOSIS — F32A Depression, unspecified: Secondary | ICD-10-CM | POA: Diagnosis not present

## 2020-08-09 DIAGNOSIS — F419 Anxiety disorder, unspecified: Secondary | ICD-10-CM | POA: Insufficient documentation

## 2020-08-09 DIAGNOSIS — Z79899 Other long term (current) drug therapy: Secondary | ICD-10-CM | POA: Insufficient documentation

## 2020-08-09 DIAGNOSIS — I10 Essential (primary) hypertension: Secondary | ICD-10-CM | POA: Diagnosis not present

## 2020-08-09 DIAGNOSIS — F17210 Nicotine dependence, cigarettes, uncomplicated: Secondary | ICD-10-CM | POA: Diagnosis not present

## 2020-08-09 DIAGNOSIS — E512 Wernicke's encephalopathy: Secondary | ICD-10-CM | POA: Diagnosis not present

## 2020-08-09 DIAGNOSIS — Z1211 Encounter for screening for malignant neoplasm of colon: Secondary | ICD-10-CM

## 2020-08-09 DIAGNOSIS — F17200 Nicotine dependence, unspecified, uncomplicated: Secondary | ICD-10-CM

## 2020-08-09 MED ORDER — VALSARTAN-HYDROCHLOROTHIAZIDE 80-12.5 MG PO TABS: 1.0000 | ORAL_TABLET | Freq: Every day | ORAL | 1 refills | Status: DC

## 2020-08-09 MED FILL — VALSARTAN-HCTZ 80-12.5 MG T: 80-12.5 | 90 days supply | Qty: 90 | Fill #0

## 2020-08-09 NOTE — Assessment & Plan Note (Signed)
  .   Current smoking consumption amount: Half a pack a day  . Dicsussion on advise to quit smoking and smoking impacts: Cardiovascular lung health  . Patient's willingness to quit: Interested in quitting  . Methods to quit smoking discussed: Behavioral modification  . Medication management of smoking session drugs discussed: Nicotine replacement  . Resources provided:  AVS   . Setting quit date not established  . Follow-up arranged 1 month   Time spent counseling the patient: 5 minutes

## 2020-08-09 NOTE — Assessment & Plan Note (Signed)
Lower extremity edema likely in the basis of side effect from amlodipine  Plan here is to discontinue amlodipine and begin valsartan HCT daily and follow-up

## 2020-08-09 NOTE — Assessment & Plan Note (Signed)
Recent labs show resolution of alcoholic hepatitis and I applauded the patient on alcohol abstinence

## 2020-08-09 NOTE — Progress Notes (Signed)
Concerns with BLE edema Takes leg measurements at home-  Rt leg- 13.25  a.m. Rt leg- 14.25 HS

## 2020-08-09 NOTE — Patient Instructions (Signed)
Discontinue amlodipine  Begin valsartan HCT 1 daily for blood pressure in order to get the swelling in the legs down and keep blood pressure under control The valsartan HCT was sent to our pharmacy  Pick up a fecal occult kit and perform the testing and bring it back in and drop it off to screen for colon cancer  Keep your follow-ups with physical therapy occupational therapy and your vocational rehab  No other changes in medications  Continue to work on reducing your tobacco intake  Return to see Dr. Joya Gaskins 2 months

## 2020-08-09 NOTE — Assessment & Plan Note (Signed)
Improved Warnicke Korsakoff syndrome with alcohol abstinence

## 2020-08-10 ENCOUNTER — Ambulatory Visit: Payer: Medicaid Other | Admitting: Physical Therapy

## 2020-08-10 DIAGNOSIS — R2689 Other abnormalities of gait and mobility: Secondary | ICD-10-CM

## 2020-08-10 DIAGNOSIS — R278 Other lack of coordination: Secondary | ICD-10-CM

## 2020-08-10 DIAGNOSIS — R2681 Unsteadiness on feet: Secondary | ICD-10-CM

## 2020-08-10 DIAGNOSIS — R208 Other disturbances of skin sensation: Secondary | ICD-10-CM

## 2020-08-10 DIAGNOSIS — M6281 Muscle weakness (generalized): Secondary | ICD-10-CM
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
## 2020-08-12 MED FILL — XARELTO 20 MG TABLET: 20 | 30 days supply | Qty: 30 | Fill #4

## 2020-08-18 ENCOUNTER — Other Ambulatory Visit: Payer: Self-pay

## 2020-08-18 ENCOUNTER — Ambulatory Visit: Payer: Medicaid Other | Admitting: Physical Therapy

## 2020-08-18 DIAGNOSIS — R2689 Other abnormalities of gait and mobility: Secondary | ICD-10-CM | POA: Diagnosis not present

## 2020-08-18 DIAGNOSIS — R278 Other lack of coordination: Secondary | ICD-10-CM

## 2020-08-18 DIAGNOSIS — M6281 Muscle weakness (generalized): Secondary | ICD-10-CM

## 2020-08-18 DIAGNOSIS — R2681 Unsteadiness on feet: Secondary | ICD-10-CM
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
## 2020-08-19 ENCOUNTER — Encounter: Payer: Medicaid Other | Admitting: Physical Therapy

## 2020-08-20 ENCOUNTER — Ambulatory Visit: Payer: Medicaid Other | Admitting: Physical Therapy

## 2020-08-20 ENCOUNTER — Ambulatory Visit: Payer: Medicaid Other | Admitting: Occupational Therapy

## 2020-08-20 MED FILL — GABAPENTIN 300 MG CAPSULE: 300 | 30 days supply | Qty: 180 | Fill #0

## 2020-08-20 MED FILL — XARELTO 20 MG TABLET: 20 | 30 days supply | Qty: 30 | Fill #4

## 2020-08-23 ENCOUNTER — Telehealth: Payer: Self-pay | Admitting: Critical Care Medicine

## 2020-08-23 DIAGNOSIS — H9190 Unspecified hearing loss, unspecified ear: Secondary | ICD-10-CM

## 2020-08-23 NOTE — Telephone Encounter (Signed)
Called patient to reschedule his 09/16/20 appointment. Patient [name] answered and patient appointment was rescheduled. Patient wife would like for patient to be referred out to see an audiologist because he is having a hard time hearing. Please f/u

## 2020-08-24 NOTE — Telephone Encounter (Signed)
Referral to audiology made

## 2020-08-25 ENCOUNTER — Ambulatory Visit: Payer: Medicaid Other | Admitting: Occupational Therapy

## 2020-08-26 ENCOUNTER — Ambulatory Visit: Payer: Medicaid Other | Admitting: Occupational Therapy

## 2020-08-31 ENCOUNTER — Other Ambulatory Visit: Payer: Self-pay

## 2020-08-31 ENCOUNTER — Ambulatory Visit: Payer: Medicaid Other | Admitting: Occupational Therapy

## 2020-08-31 ENCOUNTER — Ambulatory Visit: Payer: Medicaid Other | Attending: Critical Care Medicine | Admitting: Physical Therapy

## 2020-08-31 ENCOUNTER — Encounter: Payer: Self-pay | Admitting: Physical Therapy

## 2020-08-31 ENCOUNTER — Encounter: Payer: Self-pay | Admitting: Occupational Therapy

## 2020-08-31 DIAGNOSIS — H903 Sensorineural hearing loss, bilateral: Secondary | ICD-10-CM | POA: Diagnosis present

## 2020-08-31 DIAGNOSIS — M6281 Muscle weakness (generalized): Secondary | ICD-10-CM | POA: Diagnosis present

## 2020-08-31 DIAGNOSIS — R208 Other disturbances of skin sensation: Secondary | ICD-10-CM | POA: Insufficient documentation

## 2020-08-31 DIAGNOSIS — R2689 Other abnormalities of gait and mobility: Secondary | ICD-10-CM | POA: Diagnosis present

## 2020-08-31 DIAGNOSIS — R41844 Frontal lobe and executive function deficit: Secondary | ICD-10-CM | POA: Insufficient documentation

## 2020-08-31 DIAGNOSIS — R41840 Attention and concentration deficit: Secondary | ICD-10-CM | POA: Insufficient documentation

## 2020-08-31 DIAGNOSIS — R2681 Unsteadiness on feet: Secondary | ICD-10-CM | POA: Insufficient documentation

## 2020-08-31 DIAGNOSIS — R278 Other lack of coordination: Secondary | ICD-10-CM | POA: Insufficient documentation

## 2020-08-31 DIAGNOSIS — H9313 Tinnitus, bilateral: Secondary | ICD-10-CM | POA: Diagnosis present
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
## 2020-09-01 ENCOUNTER — Encounter: Payer: Medicaid Other | Admitting: Occupational Therapy

## 2020-09-01 ENCOUNTER — Ambulatory Visit: Payer: Medicaid Other | Admitting: Physical Therapy
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
## 2020-09-07 ENCOUNTER — Ambulatory Visit: Payer: Medicaid Other | Admitting: Occupational Therapy

## 2020-09-07 ENCOUNTER — Ambulatory Visit: Payer: Medicaid Other | Admitting: Physical Therapy

## 2020-09-08 ENCOUNTER — Encounter: Payer: Medicaid Other | Admitting: Occupational Therapy

## 2020-09-14 ENCOUNTER — Encounter: Payer: Self-pay | Admitting: Occupational Therapy

## 2020-09-14 ENCOUNTER — Encounter: Payer: Self-pay | Admitting: Physical Therapy

## 2020-09-14 ENCOUNTER — Ambulatory Visit: Payer: Medicaid Other | Admitting: Physical Therapy

## 2020-09-14 ENCOUNTER — Other Ambulatory Visit: Payer: Self-pay

## 2020-09-14 ENCOUNTER — Ambulatory Visit: Payer: Medicaid Other | Admitting: Occupational Therapy

## 2020-09-14 DIAGNOSIS — R2681 Unsteadiness on feet: Secondary | ICD-10-CM

## 2020-09-14 DIAGNOSIS — M6281 Muscle weakness (generalized): Secondary | ICD-10-CM

## 2020-09-14 DIAGNOSIS — R278 Other lack of coordination: Secondary | ICD-10-CM

## 2020-09-14 DIAGNOSIS — R41844 Frontal lobe and executive function deficit: Secondary | ICD-10-CM

## 2020-09-14 DIAGNOSIS — R41840 Attention and concentration deficit: Secondary | ICD-10-CM

## 2020-09-14 DIAGNOSIS — R2689 Other abnormalities of gait and mobility: Secondary | ICD-10-CM

## 2020-09-14 NOTE — Patient Instructions (Signed)
Memory Compensation Strategies  1. Use "WARM" strategy. W= write it down A=  associate it R=  repeat it M=  make a mental picture  2. You can keep a Memory Notebook. Use a 3-ring notebook with sections for the following:  calendar, important names and phone numbers, medications, doctors' names/phone numbers, "to do list"/reminders, and a section to journal what you did each day  3. Use a calendar to write appointments down.  4. Write yourself a schedule for the day.  This can be placed on the calendar or in a separate section of the Memory Notebook.  Keeping a regular schedule can help memory.  5. Use medication organizer with sections for each day or morning/evening pills  You may need help loading it  6. Keep a basket, or pegboard by the door.   Place items that you need to take out with you in the basket or on the pegboard.  You may also want to include a message board for reminders.  7. Use sticky notes. Place sticky notes with reminders in a place where the task is performed.  For example:  "turn off the stove" placed by the stove, "lock the door" placed on the door at eye level, "take your medications" on the bathroom mirror or by the place where you normally take your medications  8. Use alarms/timers.  Use while cooking to remind yourself to check on food or as a reminder to take your medicine, or as a reminder to make a call, or as a reminder to perform another task, etc.  9. Use a voice recorder app or small tape recorder to record important information and notes for yourself. Go back at the end of the day and listen to these.  
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
## 2020-09-15 ENCOUNTER — Encounter: Payer: Medicaid Other | Admitting: Occupational Therapy

## 2020-09-15 ENCOUNTER — Encounter: Payer: Self-pay | Admitting: Physical Therapy
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
## 2020-09-16 ENCOUNTER — Ambulatory Visit: Payer: Medicaid Other | Admitting: Critical Care Medicine

## 2020-09-21 ENCOUNTER — Ambulatory Visit: Payer: Medicaid Other | Admitting: Occupational Therapy

## 2020-09-21 ENCOUNTER — Other Ambulatory Visit: Payer: Self-pay

## 2020-09-21 ENCOUNTER — Other Ambulatory Visit: Payer: Self-pay | Admitting: Critical Care Medicine

## 2020-09-21 ENCOUNTER — Encounter: Payer: Self-pay | Admitting: Occupational Therapy

## 2020-09-21 ENCOUNTER — Ambulatory Visit: Payer: Medicaid Other

## 2020-09-21 DIAGNOSIS — R2681 Unsteadiness on feet: Secondary | ICD-10-CM

## 2020-09-21 DIAGNOSIS — R41840 Attention and concentration deficit: Secondary | ICD-10-CM

## 2020-09-21 DIAGNOSIS — R278 Other lack of coordination: Secondary | ICD-10-CM

## 2020-09-21 DIAGNOSIS — M6281 Muscle weakness (generalized): Secondary | ICD-10-CM

## 2020-09-21 DIAGNOSIS — R208 Other disturbances of skin sensation: Secondary | ICD-10-CM

## 2020-09-21 DIAGNOSIS — R41844 Frontal lobe and executive function deficit: Secondary | ICD-10-CM

## 2020-09-21 DIAGNOSIS — R2689 Other abnormalities of gait and mobility: Secondary | ICD-10-CM

## 2020-09-21 MED ORDER — GABAPENTIN 300 MG PO CAPS: 600.0000 mg | ORAL_CAPSULE | Freq: Three times a day (TID) | ORAL | 0 refills | Status: DC
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
## 2020-09-21 NOTE — Telephone Encounter (Signed)
1) Medication(s) Requested (by name): Gabapentin   2) Pharmacy of Choice: [facility]   3) Special Requests:   Approved medications will be sent to the pharmacy, we will reach out if there is an issue.  Requests made after 3pm may not be addressed until the following business day!  If a patient is unsure of the name of the medication(s) please note and ask patient to call back when they are able to provide all info, do not send to responsible party until all information is available!
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
## 2020-09-22 ENCOUNTER — Other Ambulatory Visit: Payer: Self-pay | Admitting: Critical Care Medicine

## 2020-09-22 ENCOUNTER — Encounter: Payer: Medicaid Other | Admitting: Occupational Therapy

## 2020-09-22 MED FILL — XARELTO 20 MG TABLET: 20 | 30 days supply | Qty: 30 | Fill #0

## 2020-09-22 MED FILL — GABAPENTIN 300 MG CAPSULE: 300 | 30 days supply | Qty: 180 | Fill #0

## 2020-09-23 ENCOUNTER — Ambulatory Visit: Payer: Medicaid Other | Admitting: Audiologist

## 2020-09-23 ENCOUNTER — Other Ambulatory Visit: Payer: Self-pay

## 2020-09-23 DIAGNOSIS — H9313 Tinnitus, bilateral: Secondary | ICD-10-CM

## 2020-09-23 DIAGNOSIS — H903 Sensorineural hearing loss, bilateral: Secondary | ICD-10-CM

## 2020-09-23 DIAGNOSIS — R2681 Unsteadiness on feet: Secondary | ICD-10-CM | POA: Diagnosis not present
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
## 2020-09-28 ENCOUNTER — Ambulatory Visit: Payer: Medicaid Other | Admitting: Occupational Therapy

## 2020-09-28 ENCOUNTER — Ambulatory Visit: Payer: Medicaid Other | Attending: Critical Care Medicine | Admitting: Physical Therapy

## 2020-09-28 ENCOUNTER — Other Ambulatory Visit: Payer: Self-pay

## 2020-09-28 ENCOUNTER — Encounter: Payer: Self-pay | Admitting: Physical Therapy

## 2020-09-28 DIAGNOSIS — R278 Other lack of coordination: Secondary | ICD-10-CM | POA: Diagnosis present

## 2020-09-28 DIAGNOSIS — R2689 Other abnormalities of gait and mobility: Secondary | ICD-10-CM | POA: Insufficient documentation

## 2020-09-28 DIAGNOSIS — R2681 Unsteadiness on feet: Secondary | ICD-10-CM | POA: Insufficient documentation

## 2020-09-28 DIAGNOSIS — M6281 Muscle weakness (generalized): Secondary | ICD-10-CM | POA: Insufficient documentation
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
## 2020-09-29 ENCOUNTER — Encounter: Payer: Medicaid Other | Admitting: Occupational Therapy

## 2020-10-05 ENCOUNTER — Ambulatory Visit: Payer: Medicaid Other | Admitting: Physical Therapy

## 2020-10-05 ENCOUNTER — Encounter: Payer: Medicaid Other | Admitting: Occupational Therapy

## 2020-10-05 ENCOUNTER — Other Ambulatory Visit: Payer: Self-pay

## 2020-10-05 ENCOUNTER — Encounter: Payer: Self-pay | Admitting: Physical Therapy

## 2020-10-05 DIAGNOSIS — M6281 Muscle weakness (generalized): Secondary | ICD-10-CM

## 2020-10-05 DIAGNOSIS — R2681 Unsteadiness on feet: Secondary | ICD-10-CM

## 2020-10-05 DIAGNOSIS — R278 Other lack of coordination: Secondary | ICD-10-CM

## 2020-10-05 DIAGNOSIS — R2689 Other abnormalities of gait and mobility: Secondary | ICD-10-CM
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
## 2020-10-06 ENCOUNTER — Encounter: Payer: Medicaid Other | Admitting: Occupational Therapy

## 2020-10-08 ENCOUNTER — Ambulatory Visit: Payer: Medicaid Other | Attending: Otolaryngology | Admitting: Otolaryngology

## 2020-10-08 ENCOUNTER — Encounter: Payer: Self-pay | Admitting: Otolaryngology

## 2020-10-08 ENCOUNTER — Other Ambulatory Visit: Payer: Self-pay

## 2020-10-08 VITALS — BP 124/75 | HR 75 | Ht 74.0 in | Wt 211.0 lb

## 2020-10-08 DIAGNOSIS — H833X3 Noise effects on inner ear, bilateral: Secondary | ICD-10-CM

## 2020-10-08 DIAGNOSIS — H9313 Tinnitus, bilateral: Secondary | ICD-10-CM
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
## 2020-10-12 ENCOUNTER — Ambulatory Visit: Payer: Medicaid Other | Admitting: Physical Therapy

## 2020-10-14 ENCOUNTER — Other Ambulatory Visit: Payer: Self-pay | Admitting: Critical Care Medicine

## 2020-10-14 ENCOUNTER — Ambulatory Visit: Payer: Medicaid Other | Attending: Critical Care Medicine | Admitting: Critical Care Medicine

## 2020-10-14 ENCOUNTER — Encounter: Payer: Self-pay | Admitting: Critical Care Medicine

## 2020-10-14 ENCOUNTER — Other Ambulatory Visit: Payer: Self-pay

## 2020-10-14 DIAGNOSIS — F1010 Alcohol abuse, uncomplicated: Secondary | ICD-10-CM | POA: Insufficient documentation

## 2020-10-14 DIAGNOSIS — F1096 Alcohol use, unspecified with alcohol-induced persisting amnestic disorder: Secondary | ICD-10-CM | POA: Diagnosis not present

## 2020-10-14 DIAGNOSIS — Z79899 Other long term (current) drug therapy: Secondary | ICD-10-CM | POA: Insufficient documentation

## 2020-10-14 DIAGNOSIS — M545 Low back pain, unspecified: Secondary | ICD-10-CM | POA: Insufficient documentation

## 2020-10-14 DIAGNOSIS — K219 Gastro-esophageal reflux disease without esophagitis: Secondary | ICD-10-CM | POA: Diagnosis not present

## 2020-10-14 DIAGNOSIS — Z87828 Personal history of other (healed) physical injury and trauma: Secondary | ICD-10-CM | POA: Insufficient documentation

## 2020-10-14 DIAGNOSIS — Z7289 Other problems related to lifestyle: Secondary | ICD-10-CM

## 2020-10-14 DIAGNOSIS — Z789 Other specified health status: Secondary | ICD-10-CM

## 2020-10-14 DIAGNOSIS — I10 Essential (primary) hypertension: Secondary | ICD-10-CM | POA: Diagnosis not present

## 2020-10-14 DIAGNOSIS — I82511 Chronic embolism and thrombosis of right femoral vein: Secondary | ICD-10-CM | POA: Diagnosis present

## 2020-10-14 DIAGNOSIS — F17200 Nicotine dependence, unspecified, uncomplicated: Secondary | ICD-10-CM

## 2020-10-14 DIAGNOSIS — Z7901 Long term (current) use of anticoagulants: Secondary | ICD-10-CM

## 2020-10-14 DIAGNOSIS — F17210 Nicotine dependence, cigarettes, uncomplicated: Secondary | ICD-10-CM | POA: Diagnosis not present

## 2020-10-14 MED ORDER — VALSARTAN-HYDROCHLOROTHIAZIDE 80-12.5 MG PO TABS: 1.0000 | ORAL_TABLET | Freq: Every day | ORAL | 1 refills | Status: DC

## 2020-10-14 MED ORDER — PANTOPRAZOLE SODIUM 40 MG PO TBEC
40.0000 mg | DELAYED_RELEASE_TABLET | Freq: Every day | ORAL | 3 refills | Status: DC
Start: 2020-10-14 — End: 2020-10-14

## 2020-10-14 MED ORDER — RIVAROXABAN 20 MG PO TABS
20.0000 mg | ORAL_TABLET | Freq: Every day | ORAL | 4 refills | Status: DC
Start: 2020-10-14 — End: 2020-10-14

## 2020-10-14 MED ORDER — METOPROLOL TARTRATE 50 MG PO TABS
50.0000 mg | ORAL_TABLET | Freq: Two times a day (BID) | ORAL | 0 refills | Status: DC
Start: 2020-10-14 — End: 2020-10-14

## 2020-10-14 MED ORDER — GABAPENTIN 400 MG PO CAPS: 800.0000 mg | ORAL_CAPSULE | Freq: Three times a day (TID) | ORAL | 1 refills | Status: DC

## 2020-10-14 NOTE — Progress Notes (Signed)
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 mouth 3 (three) times daily. 180 capsule 0  . metoprolol tartrate (LOPRESSOR) 50 MG tablet Take 1 tablet (50 mg total) by mouth 2 (two) times daily. 60 tablet 3  . pantoprazole (PROTONIX) 40 MG tablet Take 1 tablet (40 mg total) by mouth daily. 30 tablet 3  . rivaroxaban (XARELTO) 20 MG TABS tablet Take 1 tablet (20 mg total) by mouth daily with supper. Begin when starter pak complete 30 tablet 4  . valsartan-hydrochlorothiazide (DIOVAN HCT) 80-12.5 MG tablet Take 1 tablet by mouth daily. 90 tablet 1   No facility-administered medications prior to visit.      Review of Systems  Constitutional: Negative.   HENT: Negative.   Respiratory: Negative.   Cardiovascular: Negative.   Gastrointestinal: Negative.   Endocrine: Negative.   Genitourinary: Negative.   Musculoskeletal: Positive for back pain and gait problem.  Skin: Negative.   Neurological: Positive for weakness and numbness. Negative for seizures.  Psychiatric/Behavioral: Positive for behavioral problems, decreased concentration and dysphoric mood. Negative for agitation, confusion, self-injury,  sleep disturbance and suicidal ideas. The patient is nervous/anxious. The patient is not hyperactive.         Objective:   Physical Exam Vitals:   10/14/20 1027  BP: 134/77  Pulse: 95  No exam this was a phone visit Vital signs obtained as the patient obtain his blood pressure while I was on the call with him  CT head 9/29: CT Head Wo Contrast  Result Date: 04/28/2020 CLINICAL DATA: Alcohol withdrawal EXAM: CT HEAD WITHOUT CONTRAST TECHNIQUE: Contiguous axial images were obtained from the base of the skull through the vertex without intravenous contrast. COMPARISON: None. FINDINGS: Brain: No evidence of acute infarction, hemorrhage, hydrocephalus, extra-axial collection or mass lesion/mass effect. Mild diffuse cerebral atrophy. Vascular: No hyperdense vessel or unexpected calcification. Skull: Calvarium appears intact. Sinuses/Orbits: Paranasal sinuses and mastoid air cells are clear. Other: None. IMPRESSION: 1. No acute intracranial abnormalities. 2. Mild diffuse cerebral atrophy. Electronically Signed By: Burman Nieves M.D. On: 04/28/2020 06:08    8/22 MRI Lumbar: IMPRESSION: No stenosis.  L3-4: Annular fissure in the right extraforaminal region adjacent to the right L3 nerve. No nerve compression. Nerve irritation is possible.  L4-5: Minimal disc bulge and facet prominence. No stenosis. The facet arthritis could be associated with low back pain.  L5-S1: Central annular fissures and annular bulging, adjacent to the S1 nerves. No nerve compression, but neural irritation could occur. 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     Assessment & Plan:  I personally reviewed all images and lab data in the [facility] system as well as any outside material available during this office visit and agree with the  radiology impressions.   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 (HCC) Plan to continue anticoagulation for life keep the inferior vena cava filter in for  life  Essential hypertension Blood pressure well controlled no change in meds  GERD (gastroesophageal reflux disease) Continue reflux medicine  Wernicke-Korsakoff syndrome (alcoholic) (HCC) Improved with abstinence from alcohol and vitamin B1 B6 folic acid supplementation  Alcohol use Has been abstinent now since his last hospitalization  On continuous oral anticoagulation Follow-up blood counts  Tobacco use disorder    . Current smoking consumption amount: Half a pack a day  . Dicsussion on advise to quit smoking and smoking impacts:  Cardiovascular lung health  . Patient's willingness to quit: Interested in quitting  . Methods to quit smoking discussed: Behavioral modification  . Medication management of smoking session drugs discussed: Nicotine replacement  . Resources provided:  AVS   . Setting quit date not established  . Follow-up arranged 1 month   Time spent counseling the patient: 5 minutes     Diagnoses and all orders for this visit:  Chronic deep vein thrombosis (DVT) of femoral vein of right lower extremity (HCC)  Essential hypertension  Gastroesophageal reflux disease without esophagitis  Wernicke-Korsakoff syndrome (alcoholic) (HCC)  Alcohol use  On continuous oral anticoagulation  Tobacco use disorder  Other orders -     gabapentin (NEURONTIN) 400 MG capsule; Take 2 capsules (800 mg total) by mouth 3 (three) times daily. -     metoprolol tartrate (LOPRESSOR) 50 MG tablet; Take 1 tablet (50 mg total) by mouth 2 (two) times daily. -     pantoprazole (PROTONIX) 40 MG tablet; Take 1 tablet (40 mg total) by mouth daily. -     rivaroxaban (XARELTO) 20 MG TABS tablet; Take 1 tablet (20 mg total) by mouth daily with supper. Begin when starter pak complete -     valsartan-hydrochlorothiazide (DIOVAN HCT) 80-12.5 MG tablet; Take 1 tablet by mouth daily.    Follow Up Instructions: Patient knows a follow-up visit will occur within the month   I  discussed the assessment and treatment plan with the patient. The patient was provided an opportunity to ask questions and all were answered. The patient agreed with the plan and demonstrated an understanding of the instructions.   The patient was advised to call back or seek an in-person evaluation if the symptoms worsen or if the condition fails to improve as anticipated.  I provided 20 minutes of non-face-to-face time during this encounter  including  median intraservice time , review of notes, labs, imaging, medications  and explaining diagnosis and management to the patient .    Shan Levans, MD

## 2020-10-14 NOTE — Assessment & Plan Note (Signed)
Plan to continue anticoagulation for life keep the inferior vena cava filter in for life

## 2020-10-14 NOTE — Assessment & Plan Note (Signed)
  .   Current smoking consumption amount: Half a pack a day  . Dicsussion on advise to quit smoking and smoking impacts: Cardiovascular lung health  . Patient's willingness to quit: Interested in quitting  . Methods to quit smoking discussed: Behavioral modification  . Medication management of smoking session drugs discussed: Nicotine replacement  . Resources provided:  AVS   . Setting quit date not established  . Follow-up arranged 1 month   Time spent counseling the patient: 5 minutes

## 2020-10-14 NOTE — Assessment & Plan Note (Signed)
Improved with abstinence from alcohol and vitamin B1 B6 folic acid supplementation

## 2020-10-14 NOTE — Assessment & Plan Note (Signed)
Has been abstinent now since his last hospitalization

## 2020-10-14 NOTE — Assessment & Plan Note (Signed)
Follow-up blood counts ?

## 2020-10-14 NOTE — Assessment & Plan Note (Signed)
Continue reflux medicine

## 2020-10-14 NOTE — Assessment & Plan Note (Signed)
Blood pressure well controlled no change in meds

## 2020-10-19 ENCOUNTER — Ambulatory Visit: Payer: Medicaid Other | Admitting: Physical Therapy

## 2020-10-19 ENCOUNTER — Encounter: Payer: Self-pay | Admitting: Physical Therapy

## 2020-10-19 ENCOUNTER — Telehealth: Payer: Self-pay | Admitting: Critical Care Medicine

## 2020-10-19 ENCOUNTER — Other Ambulatory Visit: Payer: Self-pay

## 2020-10-19 DIAGNOSIS — M6281 Muscle weakness (generalized): Secondary | ICD-10-CM | POA: Diagnosis not present

## 2020-10-19 DIAGNOSIS — R2681 Unsteadiness on feet: Secondary | ICD-10-CM

## 2020-10-19 DIAGNOSIS — R2689 Other abnormalities of gait and mobility: Secondary | ICD-10-CM
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
## 2020-10-19 NOTE — Telephone Encounter (Signed)
Copied from CRM [id number]. Topic: General - Other >> Oct 08, 2020  2:57 PM Elliot Gault wrote: Patient was seen today by Flo Shanks, MD and the physician advised he wanted to review audiology report. Patient will fax [phone] but it might not get there until Monday 10/11/2020, please note when received.

## 2020-10-19 NOTE — Patient Instructions (Signed)
Access Code: [facility] URL: [email or web address] Date: 10/19/2020 Prepared by: Sherlie Ban  Exercises Gastroc Stretch on Wall - 1 x daily - 7 x weekly - 20-30 sec hold Romberg Stance Eyes Closed on Foam Pad - 1 x daily - 5 x weekly - 3 sets - 30 reps Carioca with Counter Support - 1 x daily - 5 x weekly - 3 sets Tandem Walking with Counter Support - 1 x daily - 5 x weekly - 3 sets Standing Marching - 1 x daily - 5 x weekly - 3 sets Standing Balance with Eyes Closed on Foam - 1 x daily - 5 x weekly - 2 sets - 10 reps

## 2020-10-21 NOTE — Telephone Encounter (Signed)
Noted  

## 2020-10-26 ENCOUNTER — Encounter: Payer: Self-pay | Admitting: Physical Therapy

## 2020-10-26 ENCOUNTER — Ambulatory Visit: Payer: Medicaid Other | Admitting: Physical Therapy

## 2020-10-26 ENCOUNTER — Other Ambulatory Visit: Payer: Self-pay

## 2020-10-26 DIAGNOSIS — M6281 Muscle weakness (generalized): Secondary | ICD-10-CM

## 2020-10-26 DIAGNOSIS — R2681 Unsteadiness on feet: Secondary | ICD-10-CM

## 2020-10-26 DIAGNOSIS — R2689 Other abnormalities of gait and mobility: Secondary | ICD-10-CM

## 2020-10-26 MED FILL — XARELTO 20 MG TABLET: 20 | 30 days supply | Qty: 30 | Fill #0

## 2020-10-26 MED FILL — METOPROLOL TARTRATE 50 MG T: 50 | 30 days supply | Qty: 60 | Fill #0

## 2020-10-26 MED FILL — GABAPENTIN 400 MG CAPSULE: 400 | 30 days supply | Qty: 180 | Fill #0
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
## 2020-10-30 ENCOUNTER — Other Ambulatory Visit: Payer: Self-pay

## 2020-11-18 ENCOUNTER — Other Ambulatory Visit: Payer: Self-pay

## 2020-11-22 ENCOUNTER — Other Ambulatory Visit: Payer: Self-pay

## 2020-11-22 MED FILL — Gabapentin Cap 400 MG: ORAL | 30 days supply | Qty: 180 | Fill #0 | Status: AC

## 2020-11-23 ENCOUNTER — Other Ambulatory Visit: Payer: Self-pay

## 2020-11-23 MED FILL — Metoprolol Tartrate Tab 50 MG: ORAL | 30 days supply | Qty: 60 | Fill #0 | Status: AC

## 2020-11-23 MED FILL — Rivaroxaban Tab 20 MG: ORAL | 30 days supply | Qty: 30 | Fill #0 | Status: AC

## 2020-12-13 ENCOUNTER — Ambulatory Visit: Payer: Self-pay | Admitting: *Deleted

## 2020-12-13 NOTE — Telephone Encounter (Signed)
Pt's wife called stating she is concerned that the pt has a filter for blood clots;and his right leg is swollen and painful; the affected area is from his right thigh to his toes this has been worsening since last week; she is not sure of the date; the pt's wife says the pt takes Zarelto for DVTshe says the pt has been building a deck and has been on his feet more; she is not with the pt but contacted him via conference call; recommendations made per nurse triage protocol; she says the pt will not go to the ED; he is seen by Dr Delford Field at [facility]; spoke with Malachi Bonds and she concurs the pt should be evaluated in the ED; she says Dr Delford Field and his nurse are at lunch so she cannot pass the info on to them; she also gave recommendation pt be evaluated at the mobile unit today before 1500; pt's wife notified and says he can not get to the mobile unit before 1500 because he is working in [place]; she can be contacted at [phone]; will route to office for provider review and final disposition.  Reason for Disposition . Patient sounds very sick or weak to the triager  Answer Assessment - Initial Assessment Questions 1. ONSET: "When did the swelling start?" (e.g., minutes, hours, days)     Around 12/06/20 2. LOCATION: "What part of the leg is swollen?"  "Are both legs swollen or just one leg?"    thighs down to toes 3. SEVERITY: "How bad is the swelling?" (e.g., localized; mild, moderate, severe)  - Localized - small area of swelling localized to one leg  - MILD pedal edema - swelling limited to foot and ankle, pitting edema < 1/4 inch (6 mm) deep, rest and elevation eliminate most or all swelling  - MODERATE edema - swelling of lower leg to knee, pitting edema > 1/4 inch (6 mm) deep, rest and elevation only partially reduce swelling  - SEVERE edema - swelling extends above knee, facial or hand swelling present      severe 4. REDNESS: "Does the swelling look red or infected?"    Not this  morning but red since outside working 5. PAIN: "Is the swelling painful to touch?" If Yes, ask: "How painful is it?"   (Scale 1-10; mild, moderate or severe)    6 out of 10, worse by the end of the day 8 out of 10; constant 6. FEVER: "Do you have a fever?" If Yes, ask: "What is it, how was it measured, and when did it start?"     no 7. CAUSE: "What do you think is causing the leg swelling?"     Dx with chronic blood clots 8. MEDICAL HISTORY: "Do you have a history of heart failure, kidney disease, liver failure, or cancer?"      HX chronic blood clots, filter in place; takes Xarelto 9. RECURRENT SYMPTOM: "Have you had leg swelling before?" If Yes, ask: "When was the last time?" "What happened that time?"     Yes chronic blood clots 10. OTHER SYMPTOMS: "Do you have any other symptoms?" (e.g., chest pain, difficulty breathing)      no 11. PREGNANCY: "Is there any chance you are pregnant?" "When was your last menstrual period?"    n/a  Protocols used: LEG SWELLING AND EDEMA-A-AH

## 2020-12-13 NOTE — Telephone Encounter (Signed)
Will route to PCP for review. 

## 2020-12-14 ENCOUNTER — Telehealth: Payer: Self-pay | Admitting: Critical Care Medicine

## 2020-12-14 DIAGNOSIS — I82511 Chronic embolism and thrombosis of right femoral vein: Secondary | ICD-10-CM

## 2020-12-14 DIAGNOSIS — M79604 Pain in right leg: Secondary | ICD-10-CM

## 2020-12-14 NOTE — Telephone Encounter (Signed)
I will call the patient today.   Not able to access prior message as it is already closed

## 2020-12-14 NOTE — Telephone Encounter (Signed)
Called pt :  Two days of swelling on RLE and limping more.  Pt more active helping build brothers deck. ?DVT recurrence vs venous insufficiency  Still has IVC filter  On xarelto, has not missed any doses  Plan:  Venous doppler US of RLE

## 2020-12-14 NOTE — Telephone Encounter (Signed)
Pt's wife was called and informed of appointment information.

## 2020-12-16 ENCOUNTER — Other Ambulatory Visit: Payer: Self-pay

## 2020-12-16 ENCOUNTER — Ambulatory Visit: Payer: Self-pay

## 2020-12-16 ENCOUNTER — Ambulatory Visit (HOSPITAL_COMMUNITY)
Admission: RE | Admit: 2020-12-16 | Discharge: 2020-12-16 | Disposition: A | Discharge status: Home / Self Care | Payer: Medicaid Other | Source: Ambulatory Visit | Attending: Critical Care Medicine | Admitting: Critical Care Medicine

## 2020-12-16 DIAGNOSIS — I82511 Chronic embolism and thrombosis of right femoral vein: Secondary | ICD-10-CM | POA: Diagnosis not present

## 2020-12-16 DIAGNOSIS — M79604 Pain in right leg: Secondary | ICD-10-CM

## 2020-12-16 NOTE — Progress Notes (Signed)
VASCULAR LAB    Right lower extremity venous duplex has been performed.  See CV proc for preliminary results.  Called report to Hadassah Pais, [facility], RVT 12/16/2020, 2:41 PM

## 2020-12-16 NOTE — Telephone Encounter (Signed)
Candace, Vascular Tech at [facility] Vascular Lab called to give report. 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  Reason for Disposition . Lab or radiology calling with test results  Answer Assessment - Initial Assessment Questions 1. REASON FOR CALL or QUESTION: "What is your reason for calling today?" or "How can I best help you?" or "What question do you have that I can help answer?"     Calling with results 2. CALLER: Document the source of call. (e.g., laboratory, patient).     Vascular lab  Protocols used: PCP CALL - NO TRIAGE-A-AH

## 2020-12-21 ENCOUNTER — Other Ambulatory Visit: Payer: Self-pay | Admitting: Critical Care Medicine

## 2020-12-21 ENCOUNTER — Other Ambulatory Visit: Payer: Self-pay

## 2020-12-21 MED ORDER — GABAPENTIN 400 MG PO CAPS
ORAL_CAPSULE | ORAL | 0 refills | Status: DC
  Filled 2020-12-21: qty 180, 30d supply, fill #0

## 2020-12-21 MED FILL — Pantoprazole Sodium EC Tab 40 MG (Base Equiv): ORAL | 60 days supply | Qty: 60 | Fill #0 | Status: AC

## 2020-12-21 MED FILL — Metoprolol Tartrate Tab 50 MG: ORAL | 30 days supply | Qty: 60 | Fill #1 | Status: AC

## 2020-12-21 MED FILL — Rivaroxaban Tab 20 MG: ORAL | 30 days supply | Qty: 30 | Fill #1 | Status: AC

## 2020-12-21 MED FILL — Valsartan-Hydrochlorothiazide Tab 80-12.5 MG: ORAL | 90 days supply | Qty: 90 | Fill #0 | Status: AC

## 2020-12-21 NOTE — Telephone Encounter (Signed)
Requested Prescriptions  Pending Prescriptions Disp Refills  . gabapentin (NEURONTIN) 400 MG capsule 180 capsule 0    Sig: TAKE 2 CAPSULES (800 MG TOTAL) BY MOUTH 3 (THREE) TIMES DAILY.     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

## 2020-12-22 ENCOUNTER — Other Ambulatory Visit: Payer: Self-pay

## 2020-12-24 ENCOUNTER — Other Ambulatory Visit: Payer: Self-pay

## 2020-12-30 ENCOUNTER — Other Ambulatory Visit: Payer: Self-pay | Admitting: Critical Care Medicine

## 2020-12-30 ENCOUNTER — Other Ambulatory Visit: Payer: Self-pay

## 2020-12-30 NOTE — Telephone Encounter (Signed)
Requested medication (s) are due for refill today - unknown  Requested medication (s) are on the active medication list -yes  Future visit scheduled -no  Last refill: 05/11/20  Notes to clinic: Request RF- prescriber not listed  Requested Prescriptions  Pending Prescriptions Disp Refills   folic acid (FOLVITE) 1 MG tablet 30 tablet 0    Sig: TAKE 1 TABLET (1 MG TOTAL) BY MOUTH DAILY FOR 30 DAYS.      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

## 2020-12-31 ENCOUNTER — Other Ambulatory Visit: Payer: Self-pay

## 2020-12-31 MED ORDER — FOLIC ACID 1 MG PO TABS
ORAL_TABLET | ORAL | 0 refills | Status: DC
  Filled 2020-12-31: qty 30, 30d supply, fill #0
  Filled 2021-03-01: qty 30, fill #0

## 2021-01-07 ENCOUNTER — Other Ambulatory Visit: Payer: Self-pay

## 2021-01-28 ENCOUNTER — Other Ambulatory Visit: Payer: Self-pay | Admitting: Critical Care Medicine

## 2021-01-28 NOTE — Telephone Encounter (Signed)
  Notes to clinic:  Review directions for refill    Requested Prescriptions  Pending Prescriptions Disp Refills   gabapentin (NEURONTIN) 400 MG capsule 180 capsule 0      Neurology: Anticonvulsants - gabapentin Passed - 01/28/2021  9:25 AM      Passed - Valid encounter within last 12 months    Recent Outpatient Visits           3 months ago Chronic deep vein thrombosis (DVT) of femoral vein of right lower extremity [facility])   [facility] [facility] Storm Frisk, MD   3 months ago Noise-induced hearing loss of both ears   [facility] [facility] Flo Shanks, MD   5 months ago Essential hypertension   [place] [facility] Storm Frisk, MD   6 months ago 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

## 2021-01-29 MED ORDER — GABAPENTIN 400 MG PO CAPS
ORAL_CAPSULE | ORAL | 0 refills | Status: DC
  Filled 2021-01-29: qty 180, 30d supply, fill #0

## 2021-02-01 ENCOUNTER — Other Ambulatory Visit: Payer: Self-pay

## 2021-02-01 ENCOUNTER — Other Ambulatory Visit: Payer: Self-pay | Admitting: Critical Care Medicine

## 2021-02-01 ENCOUNTER — Telehealth: Payer: Self-pay | Admitting: Critical Care Medicine

## 2021-02-01 MED ORDER — METOPROLOL TARTRATE 50 MG PO TABS
ORAL_TABLET | Freq: Two times a day (BID) | ORAL | 0 refills | Status: DC
Start: 2021-02-01 — End: 2021-03-30
  Filled 2021-02-01: qty 180, 90d supply, fill #0

## 2021-02-01 MED FILL — Rivaroxaban Tab 20 MG: ORAL | 30 days supply | Qty: 30 | Fill #2 | Status: AC

## 2021-02-01 NOTE — Telephone Encounter (Signed)
Pt requesting refill on gabapentin

## 2021-02-01 NOTE — Telephone Encounter (Signed)
Pt wife chantel is calling to request if the pt can get refills on gabapentin. Please advise Cb- [phone]

## 2021-02-02 NOTE — Telephone Encounter (Signed)
Refills were sent 01/29/21 to our pharmacy

## 2021-02-28 ENCOUNTER — Other Ambulatory Visit: Payer: Self-pay | Admitting: Critical Care Medicine

## 2021-03-01 ENCOUNTER — Other Ambulatory Visit: Payer: Self-pay | Admitting: Critical Care Medicine

## 2021-03-01 MED FILL — Pantoprazole Sodium EC Tab 40 MG (Base Equiv): ORAL | 60 days supply | Qty: 60 | Fill #1 | Status: AC

## 2021-03-01 MED FILL — Valsartan-Hydrochlorothiazide Tab 80-12.5 MG: ORAL | 90 days supply | Qty: 90 | Fill #1 | Status: CN

## 2021-03-01 MED FILL — Rivaroxaban Tab 20 MG: ORAL | 30 days supply | Qty: 30 | Fill #3 | Status: AC

## 2021-03-01 NOTE — Telephone Encounter (Signed)
Requested medications are due for refill today yes  Requested medications are on the active medication list yes  Last refill 7/5  Last visit 09/2020  Future visit scheduled no  Notes to clinic Was given one month rx in July, no upcoming visit scheduled, was seen in March, am unsure why last rx was for just one month.

## 2021-03-02 ENCOUNTER — Other Ambulatory Visit: Payer: Self-pay

## 2021-03-02 MED ORDER — GABAPENTIN 400 MG PO CAPS
ORAL_CAPSULE | ORAL | 0 refills | Status: DC
  Filled 2021-03-02: qty 180, 30d supply, fill #0

## 2021-03-07 ENCOUNTER — Ambulatory Visit: Payer: Self-pay | Admitting: *Deleted

## 2021-03-07 NOTE — Telephone Encounter (Signed)
Wife reporting Dylan Snyder right hand is swollen. No rash/bite area noted. Currently being treated for recent LE DVT (10/14/20) with filter in place and Xarelto 20 mg tabs. Wife denies he has any SOB or CP and that he is working today. No appointments available with office. They will go to the mobile unit today.      Reason for Disposition . MODERATE hand swelling (e.g., visible swelling of hand and fingers; pitting edema)  Answer Assessment - Initial Assessment Questions 1. ONSET: "When did the swelling start?" (e.g., minutes, hours, days)     2 days ago 2. LOCATION: "What part of the hand is swollen?"  "Are both hands swollen or just one hand?"     Entire hand 3. SEVERITY: "How bad is the swelling?" (e.g., localized; mild, moderate, severe)   - BALL OR LUMP: small ball or lump   - LOCALIZED: puffy or swollen area or patch of skin   - JOINT SWELLING: swelling of a joint   - MILD: puffiness or mild swelling of fingers or hand   - MODERATE: fingers and hand are swollen   - SEVERE: swelling of entire hand and up into forearm     moderate 4. REDNESS: "Does the swelling look red or infected?"     no 5. PAIN: "Is the swelling painful to touch?" If Yes, ask: "How painful is it?"   (Scale 1-10; mild, moderate or severe)     She is not with the patient at the moment. He is at work 6. FEVER: "Do you have a fever?" If Yes, ask: "What is it, how was it measured, and when did it start?"      no 7. CAUSE: "What do you think is causing the hand swelling?" (e.g., heat, insect bite, pregnancy, recent injury)     Unkown but hx of DVT 8. MEDICAL HISTORY: "Do you have a history of heart failure, kidney disease, liver failure, or cancer?"     No 9. RECURRENT SYMPTOM: "Have you had hand swelling before?" If Yes, ask: "When was the last time?" "What happened that time?"     Legs and feet 10. OTHER SYMPTOMS: "Do you have any other symptoms?" (e.g., blurred vision, difficulty breathing, headache)       None  reported. He is at work 11. PREGNANCY: "Is there any chance you are pregnant?" "When was your last menstrual period?"       na  Protocols used: Hand Swelling-A-AH

## 2021-03-09 ENCOUNTER — Other Ambulatory Visit: Payer: Self-pay

## 2021-03-30 ENCOUNTER — Encounter: Payer: Self-pay | Admitting: Critical Care Medicine

## 2021-03-30 ENCOUNTER — Other Ambulatory Visit: Payer: Self-pay

## 2021-03-30 ENCOUNTER — Ambulatory Visit: Payer: 59 | Attending: Critical Care Medicine | Admitting: Critical Care Medicine

## 2021-03-30 VITALS — BP 155/90 | HR 80 | Resp 16 | Wt 247.2 lb

## 2021-03-30 DIAGNOSIS — Z7289 Other problems related to lifestyle: Secondary | ICD-10-CM

## 2021-03-30 DIAGNOSIS — I10 Essential (primary) hypertension: Secondary | ICD-10-CM

## 2021-03-30 DIAGNOSIS — Z7901 Long term (current) use of anticoagulants: Secondary | ICD-10-CM

## 2021-03-30 DIAGNOSIS — Z1211 Encounter for screening for malignant neoplasm of colon: Secondary | ICD-10-CM

## 2021-03-30 DIAGNOSIS — G629 Polyneuropathy, unspecified: Secondary | ICD-10-CM | POA: Diagnosis not present

## 2021-03-30 DIAGNOSIS — Z139 Encounter for screening, unspecified: Secondary | ICD-10-CM

## 2021-03-30 DIAGNOSIS — Z23 Encounter for immunization: Secondary | ICD-10-CM

## 2021-03-30 DIAGNOSIS — F17200 Nicotine dependence, unspecified, uncomplicated: Secondary | ICD-10-CM

## 2021-03-30 DIAGNOSIS — F1090 Alcohol use, unspecified, uncomplicated: Secondary | ICD-10-CM

## 2021-03-30 DIAGNOSIS — I82511 Chronic embolism and thrombosis of right femoral vein: Secondary | ICD-10-CM

## 2021-03-30 DIAGNOSIS — F1096 Alcohol use, unspecified with alcohol-induced persisting amnestic disorder: Secondary | ICD-10-CM

## 2021-03-30 DIAGNOSIS — Z95828 Presence of other vascular implants and grafts: Secondary | ICD-10-CM

## 2021-03-30 DIAGNOSIS — Z789 Other specified health status: Secondary | ICD-10-CM

## 2021-03-30 MED ORDER — FOLIC ACID 1 MG PO TABS
ORAL_TABLET | ORAL | 2 refills | Status: DC
  Filled 2021-03-30: qty 90, 90d supply, fill #0
  Filled 2021-05-10: qty 30, 30d supply, fill #1
  Filled 2021-06-12: qty 30, 30d supply, fill #2
  Filled 2021-07-24: qty 30, 30d supply, fill #3
  Filled 2021-10-17: qty 30, 30d supply, fill #0
  Filled 2021-10-17: qty 30, 30d supply, fill #3
  Filled 2022-01-09: qty 30, 30d supply, fill #0
  Filled 2022-02-08: qty 30, 30d supply, fill #1
  Filled 2022-03-09: qty 30, 30d supply, fill #2

## 2021-03-30 MED ORDER — PANTOPRAZOLE SODIUM 40 MG PO TBEC
DELAYED_RELEASE_TABLET | Freq: Every day | ORAL | 3 refills | Status: DC
  Filled 2021-03-30: qty 60, fill #0
  Filled 2021-05-10: qty 60, 30d supply, fill #0
  Filled 2021-06-12: qty 60, 30d supply, fill #1
  Filled 2021-07-24: qty 60, 30d supply, fill #2

## 2021-03-30 MED ORDER — VITAMIN B-1 250 MG PO TABS
250.0000 mg | ORAL_TABLET | Freq: Every day | ORAL | 5 refills | Status: DC
  Filled 2021-03-30 – 2022-03-09 (×13): qty 60, 60d supply, fill #0

## 2021-03-30 MED ORDER — PYRIDOXINE HCL 500 MG PO TABS
500.0000 mg | ORAL_TABLET | Freq: Every day | ORAL | 2 refills | Status: DC
  Filled 2021-03-30 – 2022-03-09 (×11): qty 60, 60d supply, fill #0

## 2021-03-30 MED ORDER — METOPROLOL TARTRATE 50 MG PO TABS
ORAL_TABLET | Freq: Two times a day (BID) | ORAL | 2 refills | Status: DC
  Filled 2021-03-30: qty 180, fill #0
  Filled 2021-05-10: qty 180, 90d supply, fill #0
  Filled 2021-06-12 – 2021-07-24 (×3): qty 180, 90d supply, fill #1

## 2021-03-30 MED ORDER — RIVAROXABAN 20 MG PO TABS
ORAL_TABLET | ORAL | 4 refills | Status: DC
  Filled 2021-03-30: qty 90, 90d supply, fill #0
  Filled 2021-05-10: qty 30, 30d supply, fill #1
  Filled 2021-05-12: qty 90, 90d supply, fill #1
  Filled 2021-06-12 – 2021-07-21 (×3): qty 60, 60d supply, fill #1

## 2021-03-30 MED ORDER — VALSARTAN-HYDROCHLOROTHIAZIDE 160-25 MG PO TABS
1.0000 | ORAL_TABLET | Freq: Every day | ORAL | 3 refills | Status: DC
  Filled 2021-03-30: qty 90, 90d supply, fill #0
  Filled 2021-05-10 – 2021-07-24 (×5): qty 90, 90d supply, fill #1

## 2021-03-30 MED ORDER — GABAPENTIN 400 MG PO CAPS
ORAL_CAPSULE | ORAL | 2 refills | Status: DC
  Filled 2021-03-30: qty 180, 90d supply, fill #0
  Filled 2021-05-10: qty 180, 90d supply, fill #1
  Filled 2021-05-12: qty 180, 30d supply, fill #1
  Filled 2021-06-12: qty 180, 30d supply, fill #2

## 2021-03-30 NOTE — Assessment & Plan Note (Signed)
Will increase blood pressure medicines by increasing valsartan HCT to 160/25 daily and continue metoprolol as is

## 2021-03-30 NOTE — Patient Instructions (Signed)
Flu vaccine given  Refills on all medications sent to pharmacy,  increase valsartan HCT to 160/25 daily, no other medication changes  Referral to neurology made  Return to Dr Delford Field in 4 months
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
## 2021-03-30 NOTE — Assessment & Plan Note (Signed)
Check liver profile and blood counts

## 2021-03-30 NOTE — Assessment & Plan Note (Signed)
Spent more time going over the need for alcohol cessation with this patient will continue thiamine supplementation refer back to neurology for neuropathy and ongoing memory issues and gait disturbance

## 2021-03-30 NOTE — Assessment & Plan Note (Signed)
Did not address this at this visit

## 2021-03-30 NOTE — Assessment & Plan Note (Signed)
Plan to maintain inferior vena cava filter and Xarelto for life

## 2021-03-30 NOTE — Assessment & Plan Note (Signed)
Will refer to clinical social work for alcohol counseling

## 2021-04-03 LAB — LIPID PANEL
Chol/HDL Ratio: 3.6 ratio (ref 0.0–5.0)
Cholesterol, Total: 221 mg/dL — ABNORMAL HIGH (ref 100–199)
HDL: 61 mg/dL (ref >39)
LDL Chol Calc (NIH): 141 mg/dL — ABNORMAL HIGH (ref 0–99)
Triglycerides: 109 mg/dL (ref 0–149)
VLDL Cholesterol Cal: 19 mg/dL (ref 5–40)

## 2021-04-03 LAB — CBC WITH DIFFERENTIAL/PLATELET
Basophils Absolute: 0.1 10*3/uL (ref 0.0–0.2)
Basos: 1 %
EOS (ABSOLUTE): 0.2 10*3/uL (ref 0.0–0.4)
Eos: 2 %
Hematocrit: 49.7 % (ref 37.5–51.0)
Hemoglobin: 17.3 g/dL (ref 13.0–17.7)
Immature Grans (Abs): 0.1 10*3/uL (ref 0.0–0.1)
Immature Granulocytes: 1 %
Lymphocytes Absolute: 3 10*3/uL (ref 0.7–3.1)
Lymphs: 36 %
MCH: 32.4 pg (ref 26.6–33.0)
MCHC: 34.8 g/dL (ref 31.5–35.7)
MCV: 93 fL (ref 79–97)
Monocytes Absolute: 0.8 10*3/uL (ref 0.1–0.9)
Monocytes: 10 %
Neutrophils Absolute: 4.2 10*3/uL (ref 1.4–7.0)
Neutrophils: 50 %
Platelets: 260 10*3/uL (ref 150–450)
RBC: 5.34 x10E6/uL (ref 4.14–5.80)
RDW: 12.4 % (ref 11.6–15.4)
WBC: 8.4 10*3/uL (ref 3.4–10.8)

## 2021-04-03 LAB — COMPREHENSIVE METABOLIC PANEL
ALT: 24 IU/L (ref 0–44)
AST: 34 IU/L (ref 0–40)
Albumin/Globulin Ratio: 1.5 (ref 1.2–2.2)
Albumin: 4.8 g/dL (ref 4.0–5.0)
Alkaline Phosphatase: 113 IU/L (ref 44–121)
BUN/Creatinine Ratio: 19 (ref 9–20)
BUN: 19 mg/dL (ref 6–24)
Bilirubin Total: 0.7 mg/dL (ref 0.0–1.2)
CO2: 22 mmol/L (ref 20–29)
Calcium: 10.1 mg/dL (ref 8.7–10.2)
Chloride: 98 mmol/L (ref 96–106)
Creatinine, Ser: 0.98 mg/dL (ref 0.76–1.27)
Globulin, Total: 3.2 g/dL (ref 1.5–4.5)
Glucose: 114 mg/dL — ABNORMAL HIGH (ref 65–99)
Potassium: 4.5 mmol/L (ref 3.5–5.2)
Sodium: 137 mmol/L (ref 134–144)
Total Protein: 8 g/dL (ref 6.0–8.5)
eGFR: 95 mL/min/{1.73_m2} (ref >59)

## 2021-04-03 LAB — FOLATE: Folate: 8.4 ng/mL (ref >3.0)

## 2021-04-03 LAB — VITAMIN B1: Thiamine: 182.9 nmol/L (ref 66.5–200.0)

## 2021-04-03 LAB — HEMOGLOBIN A1C
Est. average glucose Bld gHb Est-mCnc: 114 mg/dL
Hgb A1c MFr Bld: 5.6 % (ref 4.8–5.6)

## 2021-05-11 ENCOUNTER — Other Ambulatory Visit: Payer: Self-pay

## 2021-05-12 ENCOUNTER — Other Ambulatory Visit: Payer: Self-pay

## 2021-06-13 ENCOUNTER — Other Ambulatory Visit: Payer: Self-pay

## 2021-06-14 ENCOUNTER — Other Ambulatory Visit: Payer: Self-pay

## 2021-07-04 ENCOUNTER — Emergency Department (HOSPITAL_BASED_OUTPATIENT_CLINIC_OR_DEPARTMENT_OTHER): Payer: Medicaid Other

## 2021-07-04 ENCOUNTER — Encounter (HOSPITAL_BASED_OUTPATIENT_CLINIC_OR_DEPARTMENT_OTHER): Payer: Self-pay | Admitting: *Deleted

## 2021-07-04 ENCOUNTER — Inpatient Hospital Stay (HOSPITAL_BASED_OUTPATIENT_CLINIC_OR_DEPARTMENT_OTHER)
Admission: EM | Admit: 2021-07-04 | Discharge: 2021-07-09 | DRG: 439 | Disposition: A | Discharge status: Home / Self Care | Payer: Medicaid Other | Attending: Family Medicine | Admitting: Family Medicine

## 2021-07-04 ENCOUNTER — Other Ambulatory Visit: Payer: Self-pay

## 2021-07-04 DIAGNOSIS — D6959 Other secondary thrombocytopenia: Secondary | ICD-10-CM | POA: Diagnosis present

## 2021-07-04 DIAGNOSIS — K219 Gastro-esophageal reflux disease without esophagitis: Secondary | ICD-10-CM | POA: Diagnosis present

## 2021-07-04 DIAGNOSIS — I82501 Chronic embolism and thrombosis of unspecified deep veins of right lower extremity: Secondary | ICD-10-CM | POA: Diagnosis present

## 2021-07-04 DIAGNOSIS — Z95828 Presence of other vascular implants and grafts: Secondary | ICD-10-CM

## 2021-07-04 DIAGNOSIS — K760 Fatty (change of) liver, not elsewhere classified: Secondary | ICD-10-CM | POA: Diagnosis present

## 2021-07-04 DIAGNOSIS — K7010 Alcoholic hepatitis without ascites: Secondary | ICD-10-CM | POA: Diagnosis present

## 2021-07-04 DIAGNOSIS — Z6826 Body mass index (BMI) 26.0-26.9, adult: Secondary | ICD-10-CM

## 2021-07-04 DIAGNOSIS — F10931 Alcohol use, unspecified with withdrawal delirium: Secondary | ICD-10-CM

## 2021-07-04 DIAGNOSIS — R7989 Other specified abnormal findings of blood chemistry: Secondary | ICD-10-CM | POA: Diagnosis present

## 2021-07-04 DIAGNOSIS — F17210 Nicotine dependence, cigarettes, uncomplicated: Secondary | ICD-10-CM | POA: Diagnosis present

## 2021-07-04 DIAGNOSIS — E871 Hypo-osmolality and hyponatremia: Secondary | ICD-10-CM | POA: Diagnosis present

## 2021-07-04 DIAGNOSIS — F17200 Nicotine dependence, unspecified, uncomplicated: Secondary | ICD-10-CM | POA: Diagnosis present

## 2021-07-04 DIAGNOSIS — I10 Essential (primary) hypertension: Secondary | ICD-10-CM | POA: Diagnosis present

## 2021-07-04 DIAGNOSIS — Z20822 Contact with and (suspected) exposure to covid-19: Secondary | ICD-10-CM | POA: Diagnosis present

## 2021-07-04 DIAGNOSIS — Z781 Physical restraint status: Secondary | ICD-10-CM

## 2021-07-04 DIAGNOSIS — F1026 Alcohol dependence with alcohol-induced persisting amnestic disorder: Secondary | ICD-10-CM | POA: Diagnosis present

## 2021-07-04 DIAGNOSIS — Z789 Other specified health status: Secondary | ICD-10-CM | POA: Diagnosis present

## 2021-07-04 DIAGNOSIS — K859 Acute pancreatitis without necrosis or infection, unspecified: Secondary | ICD-10-CM | POA: Diagnosis present

## 2021-07-04 DIAGNOSIS — Z7901 Long term (current) use of anticoagulants: Secondary | ICD-10-CM

## 2021-07-04 DIAGNOSIS — R81 Glycosuria: Secondary | ICD-10-CM | POA: Diagnosis present

## 2021-07-04 DIAGNOSIS — D751 Secondary polycythemia: Secondary | ICD-10-CM | POA: Diagnosis present

## 2021-07-04 DIAGNOSIS — E663 Overweight: Secondary | ICD-10-CM | POA: Diagnosis present

## 2021-07-04 DIAGNOSIS — F10229 Alcohol dependence with intoxication, unspecified: Secondary | ICD-10-CM | POA: Diagnosis present

## 2021-07-04 DIAGNOSIS — R748 Abnormal levels of other serum enzymes: Secondary | ICD-10-CM | POA: Diagnosis present

## 2021-07-04 DIAGNOSIS — Z79899 Other long term (current) drug therapy: Secondary | ICD-10-CM

## 2021-07-04 DIAGNOSIS — F10231 Alcohol dependence with withdrawal delirium: Secondary | ICD-10-CM | POA: Diagnosis present

## 2021-07-04 DIAGNOSIS — K852 Alcohol induced acute pancreatitis without necrosis or infection: Principal | ICD-10-CM | POA: Diagnosis present

## 2021-07-04 DIAGNOSIS — F1090 Alcohol use, unspecified, uncomplicated: Secondary | ICD-10-CM | POA: Diagnosis present

## 2021-07-04 HISTORY — DX: Alcohol use, unspecified with alcohol-induced persisting amnestic disorder: F10.96

## 2021-07-04 LAB — COMPREHENSIVE METABOLIC PANEL
ALT: 77 U/L — ABNORMAL HIGH (ref 0–44)
AST: 229 U/L — ABNORMAL HIGH (ref 15–41)
Albumin: 4.5 g/dL (ref 3.5–5.0)
Alkaline Phosphatase: 130 U/L — ABNORMAL HIGH (ref 38–126)
Anion gap: 16 — ABNORMAL HIGH (ref 5–15)
BUN: 23 mg/dL — ABNORMAL HIGH (ref 6–20)
CO2: 19 mmol/L — ABNORMAL LOW (ref 22–32)
Calcium: 9.5 mg/dL (ref 8.9–10.3)
Chloride: 93 mmol/L — ABNORMAL LOW (ref 98–111)
Creatinine, Ser: 1.15 mg/dL (ref 0.61–1.24)
GFR, Estimated: >60 mL/min (ref >60)
Glucose, Bld: 112 mg/dL — ABNORMAL HIGH (ref 70–99)
Potassium: 3.8 mmol/L (ref 3.5–5.1)
Sodium: 128 mmol/L — ABNORMAL LOW (ref 135–145)
Total Bilirubin: 2.1 mg/dL — ABNORMAL HIGH (ref 0.3–1.2)
Total Protein: 8.4 g/dL — ABNORMAL HIGH (ref 6.5–8.1)

## 2021-07-04 LAB — CBC
HCT: 49.2 % (ref 39.0–52.0)
Hemoglobin: 17.1 g/dL — ABNORMAL HIGH (ref 13.0–17.0)
MCH: 33.1 pg (ref 26.0–34.0)
MCHC: 34.8 g/dL (ref 30.0–36.0)
MCV: 95.2 fL (ref 80.0–100.0)
Platelets: 183 10*3/uL (ref 150–400)
RBC: 5.17 MIL/uL (ref 4.22–5.81)
RDW: 12.9 % (ref 11.5–15.5)
WBC: 10.1 10*3/uL (ref 4.0–10.5)
nRBC: 0 % (ref 0.0–0.2)

## 2021-07-04 LAB — URINALYSIS, ROUTINE W REFLEX MICROSCOPIC
Bilirubin Urine: NEGATIVE
Glucose, UA: 250 mg/dL — AB
Leukocytes,Ua: NEGATIVE
Nitrite: NEGATIVE
Specific Gravity, Urine: 1.016 (ref 1.005–1.030)
pH: 5.5 (ref 5.0–8.0)

## 2021-07-04 LAB — LIPASE, BLOOD: Lipase: 206 U/L — ABNORMAL HIGH (ref 11–51)

## 2021-07-04 MED ORDER — LACTATED RINGERS IV BOLUS
1000.0000 mL | Freq: Once | INTRAVENOUS | Status: AC
  Administered 2021-07-04: 1000 mL via INTRAVENOUS

## 2021-07-04 MED ORDER — DEXTROSE 5 % AND 0.45 % NACL IV BOLUS
1000.0000 mL | Freq: Once | INTRAVENOUS | Status: AC
  Administered 2021-07-04: 1000 mL via INTRAVENOUS

## 2021-07-04 MED ORDER — THIAMINE HCL 100 MG/ML IJ SOLN
500.0000 mg | Freq: Once | INTRAMUSCULAR | Status: AC
  Administered 2021-07-04: 500 mg via INTRAVENOUS
  Filled 2021-07-04: qty 6

## 2021-07-04 MED ORDER — FENTANYL CITRATE PF 50 MCG/ML IJ SOSY
50.0000 ug | PREFILLED_SYRINGE | Freq: Once | INTRAMUSCULAR | Status: AC
  Administered 2021-07-04: 50 ug via INTRAVENOUS
  Filled 2021-07-04: qty 1

## 2021-07-04 MED ORDER — ONDANSETRON HCL 4 MG/2ML IJ SOLN
4.0000 mg | Freq: Once | INTRAMUSCULAR | Status: AC
  Administered 2021-07-04: 4 mg via INTRAVENOUS
  Filled 2021-07-04: qty 2

## 2021-07-04 MED ORDER — IOHEXOL 300 MG/ML  SOLN
100.0000 mL | Freq: Once | INTRAMUSCULAR | Status: AC | PRN
  Administered 2021-07-04: 100 mL via INTRAVENOUS

## 2021-07-04 MED ORDER — LORAZEPAM 2 MG/ML IJ SOLN
1.0000 mg | Freq: Once | INTRAMUSCULAR | Status: AC
  Administered 2021-07-04: 1 mg via INTRAVENOUS
  Filled 2021-07-04: qty 1

## 2021-07-04 NOTE — ED Triage Notes (Signed)
Upper abdominal pain, vomiting, diarrhea since yesterday. Denies fevers.

## 2021-07-04 NOTE — ED Provider Notes (Signed)
  Provider Note MRN:  [id number]  Arrival date & time: 07/04/21    ED Course and Medical Decision Making  Assumed care from Dr. Bernette Mayers at shift change.  Alcohol use disorder, history of Warnicke Korsakoff syndrome here with altered mental status, elevated lipase, labs with some mild LFT elevation, possible alcoholic ketoacidosis.  Awaiting CT imaging, anticipating admission.  Procedures  Final Clinical Impressions(s) / ED Diagnoses  No diagnosis found.  ED Discharge Orders     None       Discharge Instructions   None     Elmer Sow. Pilar Plate, MD [facility] Emergency Medicine [facility] [email or web address]    Sabas Sous, MD 07/06/21 [phone]
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
## 2021-07-05 ENCOUNTER — Encounter (HOSPITAL_COMMUNITY): Payer: Self-pay | Admitting: Family Medicine

## 2021-07-05 DIAGNOSIS — E871 Hypo-osmolality and hyponatremia: Secondary | ICD-10-CM | POA: Diagnosis present

## 2021-07-05 DIAGNOSIS — Z6826 Body mass index (BMI) 26.0-26.9, adult: Secondary | ICD-10-CM | POA: Diagnosis not present

## 2021-07-05 DIAGNOSIS — K852 Alcohol induced acute pancreatitis without necrosis or infection: Secondary | ICD-10-CM | POA: Diagnosis present

## 2021-07-05 DIAGNOSIS — R1013 Epigastric pain: Secondary | ICD-10-CM | POA: Diagnosis present

## 2021-07-05 DIAGNOSIS — Z7901 Long term (current) use of anticoagulants: Secondary | ICD-10-CM | POA: Diagnosis not present

## 2021-07-05 DIAGNOSIS — Z79899 Other long term (current) drug therapy: Secondary | ICD-10-CM | POA: Diagnosis not present

## 2021-07-05 DIAGNOSIS — I82511 Chronic embolism and thrombosis of right femoral vein: Secondary | ICD-10-CM | POA: Diagnosis not present

## 2021-07-05 DIAGNOSIS — D751 Secondary polycythemia: Secondary | ICD-10-CM | POA: Diagnosis present

## 2021-07-05 DIAGNOSIS — F10229 Alcohol dependence with intoxication, unspecified: Secondary | ICD-10-CM | POA: Diagnosis present

## 2021-07-05 DIAGNOSIS — Z789 Other specified health status: Secondary | ICD-10-CM | POA: Diagnosis not present

## 2021-07-05 DIAGNOSIS — R7989 Other specified abnormal findings of blood chemistry: Secondary | ICD-10-CM | POA: Diagnosis present

## 2021-07-05 DIAGNOSIS — Z95828 Presence of other vascular implants and grafts: Secondary | ICD-10-CM | POA: Diagnosis not present

## 2021-07-05 DIAGNOSIS — K859 Acute pancreatitis without necrosis or infection, unspecified: Secondary | ICD-10-CM | POA: Diagnosis present

## 2021-07-05 DIAGNOSIS — D6959 Other secondary thrombocytopenia: Secondary | ICD-10-CM | POA: Diagnosis present

## 2021-07-05 DIAGNOSIS — R81 Glycosuria: Secondary | ICD-10-CM | POA: Diagnosis present

## 2021-07-05 DIAGNOSIS — E663 Overweight: Secondary | ICD-10-CM | POA: Diagnosis present

## 2021-07-05 DIAGNOSIS — I10 Essential (primary) hypertension: Secondary | ICD-10-CM | POA: Diagnosis present

## 2021-07-05 DIAGNOSIS — F17210 Nicotine dependence, cigarettes, uncomplicated: Secondary | ICD-10-CM | POA: Diagnosis present

## 2021-07-05 DIAGNOSIS — I82501 Chronic embolism and thrombosis of unspecified deep veins of right lower extremity: Secondary | ICD-10-CM | POA: Diagnosis present

## 2021-07-05 DIAGNOSIS — F10931 Alcohol use, unspecified with withdrawal delirium: Secondary | ICD-10-CM | POA: Diagnosis not present

## 2021-07-05 DIAGNOSIS — K7010 Alcoholic hepatitis without ascites: Secondary | ICD-10-CM | POA: Diagnosis present

## 2021-07-05 DIAGNOSIS — F10231 Alcohol dependence with withdrawal delirium: Secondary | ICD-10-CM | POA: Diagnosis present

## 2021-07-05 DIAGNOSIS — K219 Gastro-esophageal reflux disease without esophagitis: Secondary | ICD-10-CM | POA: Diagnosis present

## 2021-07-05 DIAGNOSIS — K760 Fatty (change of) liver, not elsewhere classified: Secondary | ICD-10-CM | POA: Diagnosis present

## 2021-07-05 DIAGNOSIS — R748 Abnormal levels of other serum enzymes: Secondary | ICD-10-CM | POA: Diagnosis present

## 2021-07-05 DIAGNOSIS — Z20822 Contact with and (suspected) exposure to covid-19: Secondary | ICD-10-CM | POA: Diagnosis present

## 2021-07-05 DIAGNOSIS — Z781 Physical restraint status: Secondary | ICD-10-CM | POA: Diagnosis not present

## 2021-07-05 DIAGNOSIS — F17200 Nicotine dependence, unspecified, uncomplicated: Secondary | ICD-10-CM | POA: Diagnosis not present

## 2021-07-05 DIAGNOSIS — F1026 Alcohol dependence with alcohol-induced persisting amnestic disorder: Secondary | ICD-10-CM | POA: Diagnosis present

## 2021-07-05 LAB — COMPREHENSIVE METABOLIC PANEL
ALT: 72 U/L — ABNORMAL HIGH (ref 0–44)
AST: 173 U/L — ABNORMAL HIGH (ref 15–41)
Albumin: 4 g/dL (ref 3.5–5.0)
Alkaline Phosphatase: 124 U/L (ref 38–126)
Anion gap: 11 (ref 5–15)
BUN: 13 mg/dL (ref 6–20)
CO2: 21 mmol/L — ABNORMAL LOW (ref 22–32)
Calcium: 9.4 mg/dL (ref 8.9–10.3)
Chloride: 96 mmol/L — ABNORMAL LOW (ref 98–111)
Creatinine, Ser: 0.91 mg/dL (ref 0.61–1.24)
GFR, Estimated: >60 mL/min (ref >60)
Glucose, Bld: 135 mg/dL — ABNORMAL HIGH (ref 70–99)
Potassium: 4.1 mmol/L (ref 3.5–5.1)
Sodium: 128 mmol/L — ABNORMAL LOW (ref 135–145)
Total Bilirubin: 4.4 mg/dL — ABNORMAL HIGH (ref 0.3–1.2)
Total Protein: 8.3 g/dL — ABNORMAL HIGH (ref 6.5–8.1)

## 2021-07-05 LAB — RESP PANEL BY RT-PCR (FLU A&B, COVID) ARPGX2
Influenza A by PCR: NEGATIVE
Influenza B by PCR: NEGATIVE
SARS Coronavirus 2 by RT PCR: NEGATIVE

## 2021-07-05 LAB — PHOSPHORUS: Phosphorus: 1.5 mg/dL — ABNORMAL LOW (ref 2.5–4.6)

## 2021-07-05 LAB — CBC
HCT: 46.3 % (ref 39.0–52.0)
Hemoglobin: 16 g/dL (ref 13.0–17.0)
MCH: 33.9 pg (ref 26.0–34.0)
MCHC: 34.6 g/dL (ref 30.0–36.0)
MCV: 98.1 fL (ref 80.0–100.0)
Platelets: 144 10*3/uL — ABNORMAL LOW (ref 150–400)
RBC: 4.72 MIL/uL (ref 4.22–5.81)
RDW: 12.9 % (ref 11.5–15.5)
WBC: 5.8 10*3/uL (ref 4.0–10.5)
nRBC: 0 % (ref 0.0–0.2)

## 2021-07-05 LAB — ETHANOL: Alcohol, Ethyl (B): 97 mg/dL — ABNORMAL HIGH (ref <10)

## 2021-07-05 LAB — PROTIME-INR
INR: 1.1 (ref 0.8–1.2)
Prothrombin Time: 14.7 seconds (ref 11.4–15.2)

## 2021-07-05 LAB — AMMONIA: Ammonia: 50 umol/L — ABNORMAL HIGH (ref 9–35)

## 2021-07-05 LAB — LIPASE, BLOOD: Lipase: 77 U/L — ABNORMAL HIGH (ref 11–51)

## 2021-07-05 LAB — MAGNESIUM: Magnesium: 2.1 mg/dL (ref 1.7–2.4)

## 2021-07-05 MED ORDER — PANTOPRAZOLE SODIUM 40 MG IV SOLR
40.0000 mg | INTRAVENOUS | Status: DC
  Administered 2021-07-05 – 2021-07-07 (×3): 40 mg via INTRAVENOUS
  Filled 2021-07-05 (×3): qty 40

## 2021-07-05 MED ORDER — FOLIC ACID 1 MG PO TABS
1.0000 mg | ORAL_TABLET | Freq: Every day | ORAL | Status: DC
  Administered 2021-07-05 – 2021-07-09 (×5): 1 mg via ORAL
  Filled 2021-07-05 (×5): qty 1

## 2021-07-05 MED ORDER — RIVAROXABAN 20 MG PO TABS
20.0000 mg | ORAL_TABLET | Freq: Every day | ORAL | Status: DC
  Administered 2021-07-05 – 2021-07-08 (×4): 20 mg via ORAL
  Filled 2021-07-05 (×4): qty 1

## 2021-07-05 MED ORDER — LORAZEPAM 2 MG/ML IJ SOLN
0.0000 mg | Freq: Four times a day (QID) | INTRAMUSCULAR | Status: AC
  Administered 2021-07-06: 2 mg via INTRAVENOUS
  Administered 2021-07-06 (×2): 1 mg via INTRAVENOUS
  Filled 2021-07-05 (×3): qty 1

## 2021-07-05 MED ORDER — LORAZEPAM 1 MG PO TABS
0.0000 mg | ORAL_TABLET | Freq: Four times a day (QID) | ORAL | Status: AC
  Administered 2021-07-05 – 2021-07-06 (×2): 1 mg via ORAL
  Filled 2021-07-05: qty 1
  Filled 2021-07-05: qty 2
  Filled 2021-07-05: qty 1

## 2021-07-05 MED ORDER — ONDANSETRON HCL 4 MG/2ML IJ SOLN: 4.0000 mg | Freq: Four times a day (QID) | INTRAMUSCULAR | Status: DC | PRN

## 2021-07-05 MED ORDER — GABAPENTIN 400 MG PO CAPS
400.0000 mg | ORAL_CAPSULE | Freq: Three times a day (TID) | ORAL | Status: DC
  Administered 2021-07-05 – 2021-07-09 (×11): 400 mg via ORAL
  Filled 2021-07-05 (×11): qty 1

## 2021-07-05 MED ORDER — METOPROLOL TARTRATE 25 MG PO TABS
50.0000 mg | ORAL_TABLET | Freq: Two times a day (BID) | ORAL | Status: DC
  Administered 2021-07-05 – 2021-07-09 (×8): 50 mg via ORAL
  Filled 2021-07-05: qty 1
  Filled 2021-07-05 (×5): qty 2
  Filled 2021-07-05 (×2): qty 1

## 2021-07-05 MED ORDER — HYDROMORPHONE HCL 1 MG/ML IJ SOLN
1.0000 mg | INTRAMUSCULAR | Status: DC | PRN
  Administered 2021-07-05 – 2021-07-06 (×3): 1 mg via INTRAVENOUS
  Filled 2021-07-05 (×3): qty 1

## 2021-07-05 MED ORDER — THIAMINE HCL 100 MG PO TABS
100.0000 mg | ORAL_TABLET | Freq: Every day | ORAL | Status: DC
  Administered 2021-07-05 – 2021-07-09 (×4): 100 mg via ORAL
  Filled 2021-07-05 (×4): qty 1

## 2021-07-05 MED ORDER — NICOTINE 21 MG/24HR TD PT24
21.0000 mg | MEDICATED_PATCH | Freq: Every day | TRANSDERMAL | Status: DC
Start: 2021-07-05 — End: 2021-07-09
  Administered 2021-07-05 – 2021-07-09 (×5): 21 mg via TRANSDERMAL
  Filled 2021-07-05 (×5): qty 1

## 2021-07-05 MED ORDER — LORAZEPAM 2 MG/ML IJ SOLN
INTRAMUSCULAR | Status: AC
  Filled 2021-07-05: qty 1

## 2021-07-05 MED ORDER — ONDANSETRON HCL 4 MG PO TABS: 4.0000 mg | ORAL_TABLET | Freq: Four times a day (QID) | ORAL | Status: DC | PRN

## 2021-07-05 MED ORDER — ADULT MULTIVITAMIN W/MINERALS CH
1.0000 | ORAL_TABLET | Freq: Every day | ORAL | Status: DC
  Administered 2021-07-05 – 2021-07-09 (×5): 1 via ORAL
  Filled 2021-07-05 (×5): qty 1

## 2021-07-05 MED ORDER — SODIUM CHLORIDE 0.9 % IV SOLN: INTRAVENOUS | Status: DC

## 2021-07-05 MED ORDER — LORAZEPAM 2 MG/ML IJ SOLN
1.0000 mg | Freq: Once | INTRAMUSCULAR | Status: AC
  Administered 2021-07-05: 1 mg via INTRAVENOUS

## 2021-07-05 MED ORDER — IRBESARTAN 150 MG PO TABS
150.0000 mg | ORAL_TABLET | Freq: Every day | ORAL | Status: DC
  Administered 2021-07-05 – 2021-07-09 (×5): 150 mg via ORAL
  Filled 2021-07-05 (×5): qty 1

## 2021-07-05 MED ORDER — LORAZEPAM 2 MG/ML IJ SOLN: 0.0000 mg | Freq: Two times a day (BID) | INTRAMUSCULAR | Status: DC

## 2021-07-05 MED ORDER — POTASSIUM PHOSPHATES 15 MMOLE/5ML IV SOLN
30.0000 mmol | Freq: Once | INTRAVENOUS | Status: AC
  Administered 2021-07-05: 30 mmol via INTRAVENOUS
  Filled 2021-07-05: qty 10

## 2021-07-05 MED ORDER — HYDROCHLOROTHIAZIDE 25 MG PO TABS
25.0000 mg | ORAL_TABLET | Freq: Every day | ORAL | Status: DC
  Administered 2021-07-05: 25 mg via ORAL
  Filled 2021-07-05: qty 1

## 2021-07-05 MED ORDER — VALSARTAN-HYDROCHLOROTHIAZIDE 160-25 MG PO TABS: 1.0000 | ORAL_TABLET | Freq: Every day | ORAL | Status: DC

## 2021-07-05 MED ORDER — THIAMINE HCL 100 MG/ML IJ SOLN
100.0000 mg | Freq: Every day | INTRAMUSCULAR | Status: DC
  Administered 2021-07-07: 100 mg via INTRAVENOUS
  Filled 2021-07-05: qty 2

## 2021-07-05 MED ORDER — LORAZEPAM 1 MG PO TABS
0.0000 mg | ORAL_TABLET | Freq: Two times a day (BID) | ORAL | Status: DC
  Administered 2021-07-06: 2 mg via ORAL
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
## 2021-07-05 NOTE — ED Notes (Signed)
Patient resting watching tv. Patient denies any needs at this time. Bed in lowest locked position, call bell in reach. Will continue to monitor.

## 2021-07-05 NOTE — ED Notes (Signed)
Patient spouse called for update. Update given.

## 2021-07-06 DIAGNOSIS — F17200 Nicotine dependence, unspecified, uncomplicated: Secondary | ICD-10-CM

## 2021-07-06 DIAGNOSIS — I10 Essential (primary) hypertension: Secondary | ICD-10-CM

## 2021-07-06 LAB — CBC
HCT: 46.7 % (ref 39.0–52.0)
Hemoglobin: 15.8 g/dL (ref 13.0–17.0)
MCH: 33.6 pg (ref 26.0–34.0)
MCHC: 33.8 g/dL (ref 30.0–36.0)
MCV: 99.4 fL (ref 80.0–100.0)
Platelets: 158 10*3/uL (ref 150–400)
RBC: 4.7 MIL/uL (ref 4.22–5.81)
RDW: 12.8 % (ref 11.5–15.5)
WBC: 6.5 10*3/uL (ref 4.0–10.5)
nRBC: 0 % (ref 0.0–0.2)

## 2021-07-06 LAB — HIV ANTIBODY (ROUTINE TESTING W REFLEX): HIV Screen 4th Generation wRfx: NONREACTIVE

## 2021-07-06 LAB — COMPREHENSIVE METABOLIC PANEL
ALT: 67 U/L — ABNORMAL HIGH (ref 0–44)
AST: 140 U/L — ABNORMAL HIGH (ref 15–41)
Albumin: 4 g/dL (ref 3.5–5.0)
Alkaline Phosphatase: 122 U/L (ref 38–126)
Anion gap: 9 (ref 5–15)
BUN: 14 mg/dL (ref 6–20)
CO2: 27 mmol/L (ref 22–32)
Calcium: 9.3 mg/dL (ref 8.9–10.3)
Chloride: 95 mmol/L — ABNORMAL LOW (ref 98–111)
Creatinine, Ser: 0.86 mg/dL (ref 0.61–1.24)
GFR, Estimated: >60 mL/min (ref >60)
Glucose, Bld: 95 mg/dL (ref 70–99)
Potassium: 4.3 mmol/L (ref 3.5–5.1)
Sodium: 131 mmol/L — ABNORMAL LOW (ref 135–145)
Total Bilirubin: 3.7 mg/dL — ABNORMAL HIGH (ref 0.3–1.2)
Total Protein: 8.3 g/dL — ABNORMAL HIGH (ref 6.5–8.1)

## 2021-07-06 LAB — PHOSPHORUS: Phosphorus: 3 mg/dL (ref 2.5–4.6)

## 2021-07-06 LAB — LIPASE, BLOOD: Lipase: 63 U/L — ABNORMAL HIGH (ref 11–51)

## 2021-07-06 MED ORDER — OXYCODONE HCL 5 MG PO TABS: 5.0000 mg | ORAL_TABLET | Freq: Four times a day (QID) | ORAL | Status: DC | PRN

## 2021-07-06 MED ORDER — ACETAMINOPHEN 325 MG PO TABS: 650.0000 mg | ORAL_TABLET | Freq: Four times a day (QID) | ORAL | Status: DC | PRN

## 2021-07-06 MED ORDER — HYDRALAZINE HCL 20 MG/ML IJ SOLN
10.0000 mg | Freq: Four times a day (QID) | INTRAMUSCULAR | Status: DC | PRN
  Administered 2021-07-07 (×2): 10 mg via INTRAVENOUS
  Filled 2021-07-06 (×3): qty 1

## 2021-07-06 NOTE — Progress Notes (Signed)
   07/06/21 1300  Mobility  Activity Ambulated in hall  Level of Assistance Contact guard assist, steadying assist  Assistive Device Front wheel walker  Distance Ambulated (ft) 180 ft  Mobility Ambulated with assistance in hallway  Mobility Response Tolerated fair  Mobility performed by Mobility specialist  $Mobility charge 1 Mobility   RN requested pt be seen. Pt agreeable to mobilizing this afternoon. He noted that he was experiencing pain in his BLE, at 8/10 pain level. He wanted to continue with session. Pt ambulated about 152ft in hall with RW, tolerated fair. No other complaints. Upon return to pt room, pain level was now 9/10. Left pt in bed with call bell at side, family and RN present.  Timoteo Expose Mobility Specialist Acute Rehab Services Office: [phone]

## 2021-07-06 NOTE — Plan of Care (Signed)
  Problem: Education: Goal: Knowledge of General Education information will improve Description: Including pain rating scale, medication(s)/side effects and non-pharmacologic comfort measures Outcome: Progressing   Problem: Health Behavior/Discharge Planning: Goal: Ability to manage health-related needs will improve Outcome: Progressing   Problem: Clinical Measurements: Goal: Ability to maintain clinical measurements within normal limits will improve Outcome: Progressing Goal: Will remain free from infection Outcome: Progressing Goal: Diagnostic test results will improve Outcome: Progressing Goal: Cardiovascular complication will be avoided Outcome: Progressing   Problem: Activity: Goal: Risk for activity intolerance will decrease Outcome: Progressing   Problem: Nutrition: Goal: Adequate nutrition will be maintained Outcome: Progressing   Problem: Coping: Goal: Level of anxiety will decrease Outcome: Progressing   Problem: Pain Managment: Goal: General experience of comfort will improve Outcome: Progressing   Problem: Safety: Goal: Ability to remain free from injury will improve Outcome: Progressing   Problem: Skin Integrity: Goal: Risk for impaired skin integrity will decrease Outcome: Progressing   
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
## 2021-07-07 DIAGNOSIS — K852 Alcohol induced acute pancreatitis without necrosis or infection: Secondary | ICD-10-CM

## 2021-07-07 DIAGNOSIS — I82511 Chronic embolism and thrombosis of right femoral vein: Secondary | ICD-10-CM

## 2021-07-07 DIAGNOSIS — Z789 Other specified health status: Secondary | ICD-10-CM

## 2021-07-07 LAB — COMPREHENSIVE METABOLIC PANEL
ALT: 79 U/L — ABNORMAL HIGH (ref 0–44)
AST: 175 U/L — ABNORMAL HIGH (ref 15–41)
Albumin: 3.7 g/dL (ref 3.5–5.0)
Alkaline Phosphatase: 126 U/L (ref 38–126)
Anion gap: 8 (ref 5–15)
BUN: 11 mg/dL (ref 6–20)
CO2: 24 mmol/L (ref 22–32)
Calcium: 9.2 mg/dL (ref 8.9–10.3)
Chloride: 100 mmol/L (ref 98–111)
Creatinine, Ser: 0.79 mg/dL (ref 0.61–1.24)
GFR, Estimated: >60 mL/min (ref >60)
Glucose, Bld: 111 mg/dL — ABNORMAL HIGH (ref 70–99)
Potassium: 3.8 mmol/L (ref 3.5–5.1)
Sodium: 132 mmol/L — ABNORMAL LOW (ref 135–145)
Total Bilirubin: 3 mg/dL — ABNORMAL HIGH (ref 0.3–1.2)
Total Protein: 7.8 g/dL (ref 6.5–8.1)

## 2021-07-07 LAB — CBC
HCT: 42 % (ref 39.0–52.0)
Hemoglobin: 13.9 g/dL (ref 13.0–17.0)
MCH: 33 pg (ref 26.0–34.0)
MCHC: 33.1 g/dL (ref 30.0–36.0)
MCV: 99.8 fL (ref 80.0–100.0)
Platelets: 147 10*3/uL — ABNORMAL LOW (ref 150–400)
RBC: 4.21 MIL/uL — ABNORMAL LOW (ref 4.22–5.81)
RDW: 12.7 % (ref 11.5–15.5)
WBC: 6.1 10*3/uL (ref 4.0–10.5)
nRBC: 0 % (ref 0.0–0.2)

## 2021-07-07 MED ORDER — DEXMEDETOMIDINE HCL IN NACL 200 MCG/50ML IV SOLN
0.2000 ug/kg/h | INTRAVENOUS | Status: DC
  Administered 2021-07-07: 0.6 ug/kg/h via INTRAVENOUS
  Administered 2021-07-07: 0.2 ug/kg/h via INTRAVENOUS
  Administered 2021-07-08 (×2): 0.6 ug/kg/h via INTRAVENOUS
  Administered 2021-07-08: 0.7 ug/kg/h via INTRAVENOUS
  Administered 2021-07-08: 0.4 ug/kg/h via INTRAVENOUS
  Administered 2021-07-08: 0.7 ug/kg/h via INTRAVENOUS
  Filled 2021-07-07 (×7): qty 50

## 2021-07-07 MED ORDER — SODIUM CHLORIDE 0.9 % IV SOLN: INTRAVENOUS | Status: DC

## 2021-07-07 MED ORDER — LORAZEPAM 2 MG/ML IJ SOLN
0.0000 mg | Freq: Four times a day (QID) | INTRAMUSCULAR | Status: DC
  Filled 2021-07-07: qty 1

## 2021-07-07 MED ORDER — CHLORHEXIDINE GLUCONATE CLOTH 2 % EX PADS
6.0000 | MEDICATED_PAD | Freq: Every day | CUTANEOUS | Status: DC
  Administered 2021-07-07: 6 via TOPICAL

## 2021-07-07 MED ORDER — HYDROMORPHONE HCL 1 MG/ML IJ SOLN: 0.5000 mg | INTRAMUSCULAR | Status: DC | PRN

## 2021-07-07 MED ORDER — LORAZEPAM 1 MG PO TABS
1.0000 mg | ORAL_TABLET | ORAL | Status: DC | PRN
  Administered 2021-07-07: 2 mg via ORAL
  Administered 2021-07-07: 3 mg via ORAL
  Filled 2021-07-07: qty 2
  Filled 2021-07-07: qty 3

## 2021-07-07 MED ORDER — LORAZEPAM 2 MG/ML IJ SOLN
1.0000 mg | INTRAMUSCULAR | Status: DC | PRN
  Administered 2021-07-07: 2 mg via INTRAVENOUS
  Administered 2021-07-07: 4 mg via INTRAVENOUS
  Administered 2021-07-07 (×2): 2 mg via INTRAVENOUS
  Filled 2021-07-07 (×2): qty 1
  Filled 2021-07-07: qty 2

## 2021-07-07 MED ORDER — LORAZEPAM 2 MG/ML IJ SOLN
1.0000 mg | INTRAMUSCULAR | Status: DC | PRN
  Administered 2021-07-07 – 2021-07-08 (×2): 2 mg via INTRAVENOUS
  Filled 2021-07-07 (×2): qty 1

## 2021-07-07 MED ORDER — LORAZEPAM 1 MG PO TABS: 0.0000 mg | ORAL_TABLET | Freq: Four times a day (QID) | ORAL | Status: DC

## 2021-07-07 MED ORDER — HALOPERIDOL LACTATE 5 MG/ML IJ SOLN
5.0000 mg | Freq: Once | INTRAMUSCULAR | Status: AC
  Administered 2021-07-07: 5 mg via INTRAMUSCULAR
  Filled 2021-07-07: qty 1

## 2021-07-07 NOTE — Plan of Care (Signed)

## 2021-07-07 NOTE — Progress Notes (Signed)
Took call from the patient's wife and was given an update and she was notified that the patient was moved to SDU. Contact information for the SDU was provided.

## 2021-07-07 NOTE — Progress Notes (Signed)
Security called to the unit, patient is having increased agitation, confusion, and sign of ETOH withdrawal. Patient sitting in the hall with his clothes out of his bag and onto the floor. Patient was assisted back to his room but patient then went into another patient's room. Charge Nurse notified, NP is on the unit to assess, RRT called to the unit. New orders for IM Haldol 5 mg and Ativan 2 mg administered, and patient was moved to SDU to be monitored closely.

## 2021-07-07 NOTE — Significant Event (Signed)
Rapid Response Event Note   Reason for Call :  Patient agitated, confused, withdrawing from alcohol, CIWA interventions not yet effective. Patient may need SD transfer for Precedex drip.  Initial Focused Assessment:  Patient sitting in hallway surrounded by security staff. Patient had been wandering into patient rooms and reportedly defecating in hallway. Eventually, previous doses of Haldol and Ativan became effective and patient went to bed to sleep.   Interventions:  Determined patient high risk to rebound with agitation. Orders given to move to stepdown (SD) bed to monitor and potentially start Precedex drip. Rapidly moved to 1238, kept in medical bed to not overstimulate. 4 point non-violent restraints ordered and placed.   Plan of Care:   IV team to place new IV now that he is calm and restrained. Will maintain CIWA protocol for now and obtain orders for Precedex if ineffective.   Event Summary:   MD Notified: Johann Capers, NP Call Time: 0000 Arrival Time: 0005 End Time: 9470  Lamona Curl, RN
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
## 2021-07-07 NOTE — Progress Notes (Addendum)
Patient is more confused, walking out into the hall, yelling, cursing, with continued hallucinations, and he pulled his IV out of his left AC. Charge nurse notified, and patient was given Ativan 2 mg by mouth which was not effective. On-call NP notified.

## 2021-07-07 NOTE — Progress Notes (Signed)
    OVERNIGHT PROGRESS REPORT  Notified by RN for Agitated and increasingly confused patient with signs of ETOH withdrawal. Patient has removed his IV access and is verbally abusive to staff and family member. Security is present.  Patient now has IM medication ordered until IV access can be safely obtained.  CIWA protocol has been re-ordered.  Patient is on Admission day 3.  IV Team will attempt to re-establish IV when safe to do so.  Chinita Greenland MSNA MSN ACNPC-AG Acute Care Nurse Practitioner [facility]

## 2021-07-07 NOTE — Progress Notes (Signed)
Patient is walking in the hallway, confused, and having hallucinations. Scheduled Ativan 2 mg administered. Patient took night meds without difficulty.
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
## 2021-07-08 DIAGNOSIS — F10931 Alcohol use, unspecified with withdrawal delirium: Secondary | ICD-10-CM

## 2021-07-08 LAB — CBC
HCT: 44.5 % (ref 39.0–52.0)
Hemoglobin: 14.8 g/dL (ref 13.0–17.0)
MCH: 33.7 pg (ref 26.0–34.0)
MCHC: 33.3 g/dL (ref 30.0–36.0)
MCV: 101.4 fL — ABNORMAL HIGH (ref 80.0–100.0)
Platelets: 146 10*3/uL — ABNORMAL LOW (ref 150–400)
RBC: 4.39 MIL/uL (ref 4.22–5.81)
RDW: 12.7 % (ref 11.5–15.5)
WBC: 6.1 10*3/uL (ref 4.0–10.5)
nRBC: 0 % (ref 0.0–0.2)

## 2021-07-08 LAB — COMPREHENSIVE METABOLIC PANEL
ALT: 118 U/L — ABNORMAL HIGH (ref 0–44)
AST: 193 U/L — ABNORMAL HIGH (ref 15–41)
Albumin: 3.6 g/dL (ref 3.5–5.0)
Alkaline Phosphatase: 120 U/L (ref 38–126)
Anion gap: 9 (ref 5–15)
BUN: 16 mg/dL (ref 6–20)
CO2: 21 mmol/L — ABNORMAL LOW (ref 22–32)
Calcium: 9.1 mg/dL (ref 8.9–10.3)
Chloride: 103 mmol/L (ref 98–111)
Creatinine, Ser: 0.92 mg/dL (ref 0.61–1.24)
GFR, Estimated: >60 mL/min (ref >60)
Glucose, Bld: 113 mg/dL — ABNORMAL HIGH (ref 70–99)
Potassium: 4.3 mmol/L (ref 3.5–5.1)
Sodium: 133 mmol/L — ABNORMAL LOW (ref 135–145)
Total Bilirubin: 2.9 mg/dL — ABNORMAL HIGH (ref 0.3–1.2)
Total Protein: 7.3 g/dL (ref 6.5–8.1)

## 2021-07-08 MED ORDER — CLONIDINE HCL 0.1 MG PO TABS
0.1000 mg | ORAL_TABLET | Freq: Two times a day (BID) | ORAL | Status: DC
  Administered 2021-07-08 – 2021-07-09 (×2): 0.1 mg via ORAL
  Filled 2021-07-08 (×2): qty 1

## 2021-07-08 MED ORDER — HALOPERIDOL LACTATE 5 MG/ML IJ SOLN
5.0000 mg | Freq: Four times a day (QID) | INTRAMUSCULAR | Status: AC | PRN
  Administered 2021-07-08 – 2021-07-09 (×2): 5 mg via INTRAMUSCULAR
  Filled 2021-07-08 (×2): qty 1

## 2021-07-08 MED ORDER — LORAZEPAM 1 MG PO TABS
2.0000 mg | ORAL_TABLET | ORAL | Status: DC | PRN
  Administered 2021-07-08: 2 mg via ORAL
  Filled 2021-07-08: qty 2

## 2021-07-08 NOTE — Plan of Care (Signed)
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
## 2021-07-09 DIAGNOSIS — R7989 Other specified abnormal findings of blood chemistry: Secondary | ICD-10-CM

## 2021-07-09 DIAGNOSIS — Z789 Other specified health status: Secondary | ICD-10-CM

## 2021-07-09 DIAGNOSIS — F10931 Alcohol use, unspecified with withdrawal delirium: Secondary | ICD-10-CM

## 2021-07-09 DIAGNOSIS — K852 Alcohol induced acute pancreatitis without necrosis or infection: Principal | ICD-10-CM

## 2021-07-09 LAB — COMPREHENSIVE METABOLIC PANEL
ALT: 134 U/L — ABNORMAL HIGH (ref 0–44)
AST: 181 U/L — ABNORMAL HIGH (ref 15–41)
Albumin: 3.7 g/dL (ref 3.5–5.0)
Alkaline Phosphatase: 120 U/L (ref 38–126)
Anion gap: 9 (ref 5–15)
BUN: 14 mg/dL (ref 6–20)
CO2: 23 mmol/L (ref 22–32)
Calcium: 9.3 mg/dL (ref 8.9–10.3)
Chloride: 101 mmol/L (ref 98–111)
Creatinine, Ser: 0.91 mg/dL (ref 0.61–1.24)
GFR, Estimated: >60 mL/min (ref >60)
Glucose, Bld: 93 mg/dL (ref 70–99)
Potassium: 3.8 mmol/L (ref 3.5–5.1)
Sodium: 133 mmol/L — ABNORMAL LOW (ref 135–145)
Total Bilirubin: 2.4 mg/dL — ABNORMAL HIGH (ref 0.3–1.2)
Total Protein: 7.5 g/dL (ref 6.5–8.1)

## 2021-07-09 LAB — CBC
HCT: 40.1 % (ref 39.0–52.0)
Hemoglobin: 13.8 g/dL (ref 13.0–17.0)
MCH: 33.4 pg (ref 26.0–34.0)
MCHC: 34.4 g/dL (ref 30.0–36.0)
MCV: 97.1 fL (ref 80.0–100.0)
Platelets: 154 10*3/uL (ref 150–400)
RBC: 4.13 MIL/uL — ABNORMAL LOW (ref 4.22–5.81)
RDW: 12.3 % (ref 11.5–15.5)
WBC: 6.7 10*3/uL (ref 4.0–10.5)
nRBC: 0 % (ref 0.0–0.2)

## 2021-07-09 LAB — MAGNESIUM: Magnesium: 2 mg/dL (ref 1.7–2.4)

## 2021-07-09 LAB — PHOSPHORUS: Phosphorus: 3.8 mg/dL (ref 2.5–4.6)

## 2021-07-09 MED ORDER — CLONIDINE HCL 0.1 MG PO TABS
0.1000 mg | ORAL_TABLET | Freq: Two times a day (BID) | ORAL | 11 refills | Status: DC
  Filled 2021-07-09 – 2021-07-21 (×2): qty 60, 30d supply, fill #0
  Filled 2021-07-24: qty 60, 30d supply, fill #1

## 2021-07-09 MED ORDER — NICOTINE 21 MG/24HR TD PT24
21.0000 mg | MEDICATED_PATCH | Freq: Every day | TRANSDERMAL | 0 refills | Status: DC
Start: 2021-07-10 — End: 2022-08-07
  Filled 2021-07-09: qty 28, 28d supply, fill #0

## 2021-07-09 MED ORDER — PANTOPRAZOLE SODIUM 40 MG PO TBEC: 40.0000 mg | DELAYED_RELEASE_TABLET | Freq: Every day | ORAL | Status: DC

## 2021-07-09 NOTE — Progress Notes (Signed)
Patient very anxious to be discharged.  Hospitalist is getting paperwork done.  Wants to leave AMA but insured him we will finish very soon.
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
## 2021-07-09 NOTE — Progress Notes (Signed)
Patient is refusing tele monitor and blood pressure checks.  Took prior to admin of meds this morning

## 2021-07-09 NOTE — Progress Notes (Addendum)
Pt now requesting medication to allow him to rest/sleep as his anxiety is increasing.  Pt agreeable to change back into gown, get back in bed and put monitor on.  Orders obtained for IM haldol and PO ativian, as pt does not have an IV in place, nor does he wish to have an IV placed at this time.  Pt requesting IM haldol - given with + effect.  Plan of care continued.  Son remains at bedside.

## 2021-07-11 ENCOUNTER — Other Ambulatory Visit: Payer: Self-pay

## 2021-07-11 ENCOUNTER — Telehealth: Payer: Self-pay

## 2021-07-11 NOTE — Telephone Encounter (Signed)
Transition Care Management Follow-up Telephone Call  Call completed with patient's wife, Clair Gulling.  DPR on file.  Date of discharge and from where: 07/09/2021, [facility]  How have you been since you were released from the hospital? She said he doing a lot better. No nausea, vomiting, diarrhea and he has returned to work doing Event organiser.  Any questions or concerns? Yes - Nicki wants Dr Delford Field to know that her husband quit drinking for now; but he really wants to drink again.  She explained that counseling/ therapy would not help him because he would forget that he even went to therapy.  She is concerned that the clonidine will make him sleepy because her grandson needed clonidine to calm down.  Nicki explained that she was told in the past that her husband had cirrhosis but now is told that " they don't see it anymore."    Items Reviewed: Did the pt receive and understand the discharge instructions provided? Yes  Medications obtained and verified?  Nicki said he has all medications except the clonidine which he needs to pick up today.  She manages his medications  Other? No  Any new allergies since your discharge? No  Dietary orders reviewed? Yes Do you have support at home? Yes , his wife.   Home Care and Equipment/Supplies: Were home health services ordered? no If so, what is the name of the agency? N/a  Has the agency set up a time to come to the patient's home? not applicable Were any new equipment or medical supplies ordered?  No What is the name of the medical supply agency? N/a Were you able to get the supplies/equipment? not applicable Do you have any questions related to the use of the equipment or supplies? No  Functional Questionnaire: (I = Independent and D = Dependent) ADLs: independent.  Walking without an assistive device. Nicki stated that he is forgetful, his memory is not very good.    Follow up appointments reviewed:  PCP Hospital f/u appt confirmed? Yes   Scheduled to see Dr Delford Field  on 07/28/2021.  Specialist Hospital f/u appt confirmed?  None scheduled at this time.    Are transportation arrangements needed? No  If their condition worsens, is the pt aware to call PCP or go to the Emergency Dept.? Yes Was the patient provided with contact information for the PCP's office or ED? Yes Was to pt encouraged to call back with questions or concerns? Yes

## 2021-07-18 ENCOUNTER — Other Ambulatory Visit: Payer: Self-pay

## 2021-07-21 ENCOUNTER — Other Ambulatory Visit: Payer: Self-pay

## 2021-07-21 ENCOUNTER — Other Ambulatory Visit: Payer: Self-pay | Admitting: Critical Care Medicine

## 2021-07-21 MED ORDER — GABAPENTIN 400 MG PO CAPS
ORAL_CAPSULE | ORAL | 2 refills | Status: DC
Start: 2021-07-21 — End: 2021-08-25
  Filled 2021-07-21: qty 180, 30d supply, fill #0
  Filled 2021-08-23: qty 180, 30d supply, fill #1
  Filled 2021-08-24: qty 180, 30d supply, fill #0

## 2021-07-21 NOTE — Telephone Encounter (Signed)
Requested Prescriptions  Pending Prescriptions Disp Refills   gabapentin (NEURONTIN) 400 MG capsule 180 capsule 2    Sig: Take 2 capsules by mouth three times daily     Neurology: Anticonvulsants - gabapentin Passed - 07/21/2021  2:40 PM      Passed - Valid encounter within last 12 months    Recent Outpatient Visits          3 months ago Chronic deep vein thrombosis (DVT) of femoral vein of right lower extremity [facility])   [place] [facility] Storm Frisk, MD   9 months ago Chronic deep vein thrombosis (DVT) of femoral vein of right lower extremity [facility])   [place] [facility] Storm Frisk, MD   9 months ago Noise-induced hearing loss of both ears   [place] [facility] Flo Shanks, MD   11 months ago Essential hypertension   [place] [facility] Storm Frisk, MD   1 year ago Wernicke-Korsakoff syndrome (alcoholic) [facility])    [facility]      Future Appointments            In 1 week Delford Field Charlcie Cradle, MD [facility]

## 2021-07-22 ENCOUNTER — Other Ambulatory Visit: Payer: Self-pay

## 2021-07-24 ENCOUNTER — Other Ambulatory Visit: Payer: Self-pay | Admitting: Critical Care Medicine

## 2021-07-26 ENCOUNTER — Other Ambulatory Visit: Payer: Self-pay

## 2021-07-26 MED ORDER — RIVAROXABAN 20 MG PO TABS
ORAL_TABLET | ORAL | 4 refills | Status: DC
Start: 2021-07-26 — End: 2021-08-25
  Filled 2021-07-26: qty 30, fill #0

## 2021-07-26 NOTE — Telephone Encounter (Signed)
Requested Prescriptions  Pending Prescriptions Disp Refills   rivaroxaban (XARELTO) 20 MG TABS tablet 30 tablet 4    Sig: TAKE 1 TABLET (20 MG TOTAL) BY MOUTH DAILY WITH SUPPER. BEGIN WHEN STARTER PAK COMPLETE     Hematology: Anticoagulants - rivaroxaban Failed - 07/24/2021  5:02 PM      Failed - ALT in normal range and within 180 days    ALT  Date Value Ref Range Status  07/09/2021 134 (H) 0 - 44 U/L Final         Failed - AST in normal range and within 180 days    AST  Date Value Ref Range Status  07/09/2021 181 (H) 15 - 41 U/L Final         Passed - Cr in normal range and within 360 days    Creatinine, Ser  Date Value Ref Range Status  07/09/2021 0.91 0.61 - 1.24 mg/dL Final         Passed - HCT in normal range and within 360 days    HCT  Date Value Ref Range Status  07/09/2021 40.1 39.0 - 52.0 % Final   Hematocrit  Date Value Ref Range Status  03/30/2021 49.7 37.5 - 51.0 % Final         Passed - HGB in normal range and within 360 days    Hemoglobin  Date Value Ref Range Status  07/09/2021 13.8 13.0 - 17.0 g/dL Final  62/13/0865 78.4 13.0 - 17.7 g/dL Final         Passed - PLT in normal range and within 360 days    Platelets  Date Value Ref Range Status  07/09/2021 154 150 - 400 K/uL Final  03/30/2021 260 150 - 450 x10E3/uL Final         Passed - Valid encounter within last 12 months    Recent Outpatient Visits          3 months ago Chronic deep vein thrombosis (DVT) of femoral vein of right lower extremity (HCC)   [place] [facility] Storm Frisk, MD   9 months ago Chronic deep vein thrombosis (DVT) of femoral vein of right lower extremity [facility])   [place] [facility] Storm Frisk, MD   9 months ago Noise-induced hearing loss of both ears   [place] [facility] Flo Shanks, MD   11 months ago Essential hypertension   [place] [facility] Storm Frisk,  MD   1 year ago Wernicke-Korsakoff syndrome (alcoholic) [facility])   [place] [facility] Storm Frisk, MD      Future Appointments            In 2 days Storm Frisk, MD [facility]

## 2021-07-28 ENCOUNTER — Ambulatory Visit: Payer: 59 | Admitting: Critical Care Medicine

## 2021-07-29 ENCOUNTER — Other Ambulatory Visit: Payer: Self-pay

## 2021-08-24 ENCOUNTER — Other Ambulatory Visit: Payer: Self-pay

## 2021-08-25 ENCOUNTER — Other Ambulatory Visit: Payer: Self-pay

## 2021-08-25 ENCOUNTER — Ambulatory Visit: Payer: Medicaid Other | Attending: Critical Care Medicine | Admitting: Critical Care Medicine

## 2021-08-25 ENCOUNTER — Encounter: Payer: Self-pay | Admitting: Critical Care Medicine

## 2021-08-25 ENCOUNTER — Telehealth: Payer: Self-pay | Admitting: Critical Care Medicine

## 2021-08-25 DIAGNOSIS — F10931 Alcohol use, unspecified with withdrawal delirium: Secondary | ICD-10-CM

## 2021-08-25 DIAGNOSIS — I10 Essential (primary) hypertension: Secondary | ICD-10-CM | POA: Diagnosis not present

## 2021-08-25 DIAGNOSIS — K852 Alcohol induced acute pancreatitis without necrosis or infection: Secondary | ICD-10-CM

## 2021-08-25 DIAGNOSIS — F1096 Alcohol use, unspecified with alcohol-induced persisting amnestic disorder: Secondary | ICD-10-CM

## 2021-08-25 DIAGNOSIS — Z789 Other specified health status: Secondary | ICD-10-CM

## 2021-08-25 DIAGNOSIS — I82511 Chronic embolism and thrombosis of right femoral vein: Secondary | ICD-10-CM

## 2021-08-25 DIAGNOSIS — R7989 Other specified abnormal findings of blood chemistry: Secondary | ICD-10-CM

## 2021-08-25 DIAGNOSIS — D751 Secondary polycythemia: Secondary | ICD-10-CM

## 2021-08-25 DIAGNOSIS — F17200 Nicotine dependence, unspecified, uncomplicated: Secondary | ICD-10-CM

## 2021-08-25 MED ORDER — CLONIDINE HCL 0.1 MG PO TABS
0.1000 mg | ORAL_TABLET | Freq: Two times a day (BID) | ORAL | 11 refills | Status: DC
  Filled 2021-08-25: qty 60, 30d supply, fill #0
  Filled 2021-11-14: qty 60, 30d supply, fill #1
  Filled 2021-12-08: qty 60, 30d supply, fill #2
  Filled 2022-01-09: qty 60, 30d supply, fill #3
  Filled 2022-02-08: qty 60, 30d supply, fill #4
  Filled 2022-03-09: qty 60, 30d supply, fill #5
  Filled 2022-04-05: qty 60, 30d supply, fill #6
  Filled 2022-04-17 – 2022-05-15 (×2): qty 60, 30d supply, fill #7
  Filled 2022-06-09: qty 60, 30d supply, fill #8
  Filled 2022-07-09: qty 60, 30d supply, fill #9
  Filled 2022-08-06: qty 60, 30d supply, fill #10

## 2021-08-25 MED ORDER — PANTOPRAZOLE SODIUM 40 MG PO TBEC
DELAYED_RELEASE_TABLET | Freq: Every day | ORAL | 3 refills | Status: DC
  Filled 2021-08-25: qty 60, 30d supply, fill #0
  Filled 2022-01-09: qty 60, 30d supply, fill #1
  Filled 2022-02-08: qty 60, 60d supply, fill #2
  Filled 2022-04-05: qty 30, 30d supply, fill #3
  Filled 2022-05-15: qty 30, 30d supply, fill #4

## 2021-08-25 MED ORDER — METOPROLOL TARTRATE 50 MG PO TABS
ORAL_TABLET | Freq: Two times a day (BID) | ORAL | 2 refills | Status: DC
  Filled 2021-08-25: qty 180, 90d supply, fill #0
  Filled 2021-12-08: qty 180, 90d supply, fill #1
  Filled 2022-03-09: qty 180, 90d supply, fill #2

## 2021-08-25 MED ORDER — VALSARTAN-HYDROCHLOROTHIAZIDE 160-25 MG PO TABS
1.0000 | ORAL_TABLET | Freq: Every day | ORAL | 3 refills | Status: DC
  Filled 2021-08-25: qty 90, 90d supply, fill #0
  Filled 2022-01-09: qty 90, 90d supply, fill #1
  Filled 2022-04-05: qty 90, 90d supply, fill #2
  Filled 2022-07-03: qty 90, 90d supply, fill #3

## 2021-08-25 MED ORDER — GABAPENTIN 400 MG PO CAPS
ORAL_CAPSULE | ORAL | 2 refills | Status: DC
  Filled 2021-09-19 – 2021-10-20 (×4): qty 180, 30d supply, fill #0
  Filled 2021-11-14: qty 180, 30d supply, fill #1

## 2021-08-25 MED ORDER — RIVAROXABAN 20 MG PO TABS
ORAL_TABLET | ORAL | 4 refills | Status: DC
  Filled 2021-08-25: qty 30, fill #0
  Filled 2022-01-09: qty 30, 30d supply, fill #0
  Filled 2022-02-08: qty 30, 30d supply, fill #1
  Filled 2022-03-09: qty 30, 30d supply, fill #2
  Filled 2022-04-05: qty 30, 30d supply, fill #3
  Filled 2022-04-29: qty 30, 30d supply, fill #4

## 2021-08-25 NOTE — Telephone Encounter (Signed)
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

## 2021-08-25 NOTE — Assessment & Plan Note (Signed)
Patient with chronic deep venous thrombosis right lower extremity will be on Xarelto for life refill sent to pharmacy no signs of bleeding
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
## 2021-08-25 NOTE — Assessment & Plan Note (Signed)
Not committed to stopping smoking at this time

## 2021-08-25 NOTE — Assessment & Plan Note (Signed)
No symptoms referable to the pancreas presently we will bring the patient in for direct exam

## 2021-08-25 NOTE — Assessment & Plan Note (Signed)
This was documented during recent admission will need to be rechecked

## 2021-08-25 NOTE — Assessment & Plan Note (Signed)
Hypertension unsure if is at goal we will refill current medications he is not been taking his valsartan HCT will make sure this is refilled  Bring patient into the office for direct exam ASAP

## 2021-08-25 NOTE — Assessment & Plan Note (Signed)
Will need follow-up of elevated liver functions he has a fatty liver on CT scan alcoholic hepatitis

## 2021-08-25 NOTE — Assessment & Plan Note (Signed)
Patient needs long-term counseling he is not yet engaged around this

## 2021-08-25 NOTE — Assessment & Plan Note (Signed)
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

## 2021-08-25 NOTE — Assessment & Plan Note (Signed)
Recurrent alcohol withdrawal currently not in DTs at this time but still drinking 2-3 beers a day sometimes 5 beers a day not yet committed to quitting drinking  Plan to connect this patient with our licensed clinical social work
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
## 2021-09-08 ENCOUNTER — Other Ambulatory Visit: Payer: Self-pay

## 2021-09-08 ENCOUNTER — Ambulatory Visit: Payer: Medicaid Other | Attending: Critical Care Medicine | Admitting: Critical Care Medicine

## 2021-09-08 ENCOUNTER — Encounter: Payer: Self-pay | Admitting: Critical Care Medicine

## 2021-09-08 VITALS — BP 134/90 | HR 86 | Resp 16 | Wt 260.6 lb

## 2021-09-08 DIAGNOSIS — F1096 Alcohol use, unspecified with alcohol-induced persisting amnestic disorder: Secondary | ICD-10-CM | POA: Diagnosis not present

## 2021-09-08 DIAGNOSIS — K7010 Alcoholic hepatitis without ascites: Secondary | ICD-10-CM | POA: Diagnosis not present

## 2021-09-08 DIAGNOSIS — Z79899 Other long term (current) drug therapy: Secondary | ICD-10-CM | POA: Insufficient documentation

## 2021-09-08 DIAGNOSIS — R200 Anesthesia of skin: Secondary | ICD-10-CM | POA: Insufficient documentation

## 2021-09-08 DIAGNOSIS — G479 Sleep disorder, unspecified: Secondary | ICD-10-CM | POA: Insufficient documentation

## 2021-09-08 DIAGNOSIS — F17210 Nicotine dependence, cigarettes, uncomplicated: Secondary | ICD-10-CM | POA: Diagnosis not present

## 2021-09-08 DIAGNOSIS — Z789 Other specified health status: Secondary | ICD-10-CM

## 2021-09-08 DIAGNOSIS — Z1211 Encounter for screening for malignant neoplasm of colon: Secondary | ICD-10-CM | POA: Diagnosis not present

## 2021-09-08 DIAGNOSIS — R059 Cough, unspecified: Secondary | ICD-10-CM | POA: Diagnosis not present

## 2021-09-08 DIAGNOSIS — D751 Secondary polycythemia: Secondary | ICD-10-CM | POA: Diagnosis not present

## 2021-09-08 DIAGNOSIS — D649 Anemia, unspecified: Secondary | ICD-10-CM | POA: Diagnosis not present

## 2021-09-08 DIAGNOSIS — R7989 Other specified abnormal findings of blood chemistry: Secondary | ICD-10-CM | POA: Diagnosis not present

## 2021-09-08 DIAGNOSIS — R202 Paresthesia of skin: Secondary | ICD-10-CM | POA: Diagnosis not present

## 2021-09-08 DIAGNOSIS — R450 Nervousness: Secondary | ICD-10-CM | POA: Diagnosis not present

## 2021-09-08 DIAGNOSIS — Z6833 Body mass index (BMI) 33.0-33.9, adult: Secondary | ICD-10-CM | POA: Insufficient documentation

## 2021-09-08 DIAGNOSIS — I749 Embolism and thrombosis of unspecified artery: Secondary | ICD-10-CM | POA: Insufficient documentation

## 2021-09-08 DIAGNOSIS — H109 Unspecified conjunctivitis: Secondary | ICD-10-CM | POA: Diagnosis not present

## 2021-09-08 DIAGNOSIS — I10 Essential (primary) hypertension: Secondary | ICD-10-CM | POA: Insufficient documentation

## 2021-09-08 DIAGNOSIS — K852 Alcohol induced acute pancreatitis without necrosis or infection: Secondary | ICD-10-CM | POA: Insufficient documentation

## 2021-09-08 DIAGNOSIS — F17200 Nicotine dependence, unspecified, uncomplicated: Secondary | ICD-10-CM

## 2021-09-08 DIAGNOSIS — Z95828 Presence of other vascular implants and grafts: Secondary | ICD-10-CM

## 2021-09-08 DIAGNOSIS — E669 Obesity, unspecified: Secondary | ICD-10-CM | POA: Diagnosis not present

## 2021-09-08 DIAGNOSIS — F39 Unspecified mood [affective] disorder: Secondary | ICD-10-CM | POA: Diagnosis not present

## 2021-09-08 DIAGNOSIS — F10931 Alcohol use, unspecified with withdrawal delirium: Secondary | ICD-10-CM

## 2021-09-08 DIAGNOSIS — Z7901 Long term (current) use of anticoagulants: Secondary | ICD-10-CM | POA: Diagnosis not present

## 2021-09-08 MED ORDER — ERYTHROMYCIN 5 MG/GM OP OINT
1.0000 "application " | TOPICAL_OINTMENT | Freq: Every day | OPHTHALMIC | 0 refills | Status: DC
  Filled 2021-09-08 – 2021-09-20 (×2): qty 3.5, 3d supply, fill #0

## 2021-09-08 MED ORDER — ALBUTEROL SULFATE HFA 108 (90 BASE) MCG/ACT IN AERS
2.0000 | INHALATION_SPRAY | Freq: Four times a day (QID) | RESPIRATORY_TRACT | 0 refills | Status: DC | PRN
  Filled 2021-09-08: qty 18, 25d supply, fill #0
  Filled 2021-09-08: qty 8, fill #0
  Filled 2021-09-20: qty 18, 25d supply, fill #0

## 2021-09-08 NOTE — Assessment & Plan Note (Signed)
Patient on chronic vitamin B1 and B6 folate supplementation  Counseled to quit drinking alcohol  Referral made to behavioral health I hope his wife will take him for open access

## 2021-09-08 NOTE — Assessment & Plan Note (Signed)
Blood pressure at goal continue current medications including valsartan HCT and metoprolol
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
## 2021-09-08 NOTE — Assessment & Plan Note (Signed)
Likely from alcoholic hepatitis Will obtain liver function

## 2021-09-08 NOTE — Assessment & Plan Note (Signed)
Patient will need IVC filter to stay in place and stay on chronic adequate anticoagulation

## 2021-09-08 NOTE — Assessment & Plan Note (Signed)
As per other alcohol disease related assessments

## 2021-09-08 NOTE — Assessment & Plan Note (Signed)
Not committed to stopping smoking at this time    Current smoking consumption amount: 1 pack a day  Dicsussion on advise to quit smoking and smoking impacts: Lung impacts  Patient's willingness to quit: Not willing to quit  Methods to quit smoking discussed: Not discussed  Medication management of smoking session drugs discussed: Failed Resources provided:  AVS   Setting quit date not established  Follow-up arranged 2 months  Time spent counseling the patient: 5 minutes  

## 2021-09-08 NOTE — Patient Instructions (Signed)
Start erythromycin gel to left eye every night  Use albuterol as needed 2 puff every 6 hours for cough  No other medication changes  Go to [facility] for evaluation of your alcohol use  open access 8-11am Mon- Friday  Reduce alcohol intake as much as you can  Complete lab draws at this visit  Return Dr Delford Field 3 months

## 2021-09-08 NOTE — Assessment & Plan Note (Signed)
Currently has resolved delirium

## 2021-09-08 NOTE — Assessment & Plan Note (Signed)
Abdominal exam is benign we will follow-up lipase levels encouraged to stop drinking alcohol made referral to behavioral health

## 2021-09-08 NOTE — Assessment & Plan Note (Signed)
Bacterial conjunctivitis right eye  Plan erythromycin eye gel for 5 days daily at night
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
## 2021-09-09 LAB — LIPID PANEL

## 2021-09-11 LAB — LIPID PANEL
Chol/HDL Ratio: 3.5 ratio (ref 0.0–5.0)
Cholesterol, Total: 236 mg/dL — ABNORMAL HIGH (ref 100–199)
HDL: 68 mg/dL (ref >39)
LDL Chol Calc (NIH): 110 mg/dL — ABNORMAL HIGH (ref 0–99)
Triglycerides: 340 mg/dL — ABNORMAL HIGH (ref 0–149)
VLDL Cholesterol Cal: 58 mg/dL — ABNORMAL HIGH (ref 5–40)

## 2021-09-11 LAB — CBC WITH DIFFERENTIAL/PLATELET
Basophils Absolute: 0.1 10*3/uL (ref 0.0–0.2)
Basos: 1 %
EOS (ABSOLUTE): 0.1 10*3/uL (ref 0.0–0.4)
Eos: 1 %
Hematocrit: 46.4 % (ref 37.5–51.0)
Hemoglobin: 16.4 g/dL (ref 13.0–17.7)
Immature Grans (Abs): 0 10*3/uL (ref 0.0–0.1)
Immature Granulocytes: 1 %
Lymphocytes Absolute: 2.6 10*3/uL (ref 0.7–3.1)
Lymphs: 37 %
MCH: 34.3 pg — ABNORMAL HIGH (ref 26.6–33.0)
MCHC: 35.3 g/dL (ref 31.5–35.7)
MCV: 97 fL (ref 79–97)
Monocytes Absolute: 0.8 10*3/uL (ref 0.1–0.9)
Monocytes: 11 %
Neutrophils Absolute: 3.6 10*3/uL (ref 1.4–7.0)
Neutrophils: 49 %
Platelets: 213 10*3/uL (ref 150–450)
RBC: 4.78 x10E6/uL (ref 4.14–5.80)
RDW: 12.9 % (ref 11.6–15.4)
WBC: 7.1 10*3/uL (ref 3.4–10.8)

## 2021-09-11 LAB — FOLATE: Folate: >20 ng/mL (ref >3.0)

## 2021-09-11 LAB — COMPREHENSIVE METABOLIC PANEL
ALT: 43 IU/L (ref 0–44)
AST: 88 IU/L — ABNORMAL HIGH (ref 0–40)
Albumin/Globulin Ratio: 1.4 (ref 1.2–2.2)
Albumin: 5.1 g/dL — ABNORMAL HIGH (ref 4.0–5.0)
Alkaline Phosphatase: 137 IU/L — ABNORMAL HIGH (ref 44–121)
BUN/Creatinine Ratio: 15 (ref 9–20)
BUN: 15 mg/dL (ref 6–24)
Bilirubin Total: 2.6 mg/dL — ABNORMAL HIGH (ref 0.0–1.2)
CO2: 22 mmol/L (ref 20–29)
Calcium: 10.1 mg/dL (ref 8.7–10.2)
Chloride: 91 mmol/L — ABNORMAL LOW (ref 96–106)
Creatinine, Ser: 0.97 mg/dL (ref 0.76–1.27)
Globulin, Total: 3.6 g/dL (ref 1.5–4.5)
Glucose: 113 mg/dL — ABNORMAL HIGH (ref 70–99)
Potassium: 3.8 mmol/L (ref 3.5–5.2)
Sodium: 132 mmol/L — ABNORMAL LOW (ref 134–144)
Total Protein: 8.7 g/dL — ABNORMAL HIGH (ref 6.0–8.5)
eGFR: 96 mL/min/{1.73_m2} (ref >59)

## 2021-09-11 LAB — LIPASE: Lipase: 24 U/L (ref 13–78)

## 2021-09-11 LAB — VITAMIN B1: Thiamine: 247.2 nmol/L — ABNORMAL HIGH (ref 66.5–200.0)

## 2021-09-12 ENCOUNTER — Other Ambulatory Visit: Payer: Self-pay

## 2021-09-12 ENCOUNTER — Other Ambulatory Visit: Payer: Self-pay | Admitting: Critical Care Medicine

## 2021-09-12 ENCOUNTER — Telehealth: Payer: Self-pay

## 2021-09-12 MED ORDER — EZETIMIBE 10 MG PO TABS
10.0000 mg | ORAL_TABLET | Freq: Every day | ORAL | 3 refills | Status: DC
  Filled 2021-09-12: qty 90, 90d supply, fill #0
  Filled 2021-09-20: qty 30, 30d supply, fill #0
  Filled 2021-11-14: qty 30, 30d supply, fill #1
  Filled 2021-12-08: qty 30, 30d supply, fill #2
  Filled 2022-01-09: qty 30, 30d supply, fill #3
  Filled 2022-02-08: qty 30, 30d supply, fill #4
  Filled 2022-03-09: qty 30, 30d supply, fill #5
  Filled 2022-04-05: qty 30, 30d supply, fill #6
  Filled 2022-05-15: qty 30, 30d supply, fill #7
  Filled 2022-05-28 – 2022-06-09 (×2): qty 30, 30d supply, fill #8
  Filled 2022-07-09: qty 30, 30d supply, fill #9
  Filled 2022-08-06: qty 30, 30d supply, fill #10

## 2021-09-12 NOTE — Telephone Encounter (Signed)
Pt was called and is aware of results, DOB was confirmed.  ?

## 2021-09-12 NOTE — Telephone Encounter (Signed)
-----   Message from Storm Frisk, MD sent at 09/12/2021  6:13 AM EST ----- Let pt know liver is improved ,   no pancreatitis seen,  blood counts , kidney normal,  vitamin levels normal, cholesterol is still high, a Rx for zetia was sent to the pharmacy to take daily

## 2021-09-15 ENCOUNTER — Other Ambulatory Visit: Payer: Self-pay

## 2021-09-16 NOTE — Telephone Encounter (Signed)
I spoke with pt and scheduled appt for 10/10/21.

## 2021-09-19 ENCOUNTER — Other Ambulatory Visit: Payer: Self-pay

## 2021-09-20 ENCOUNTER — Ambulatory Visit: Payer: Medicaid Other | Admitting: Critical Care Medicine

## 2021-09-20 ENCOUNTER — Other Ambulatory Visit: Payer: Self-pay

## 2021-10-10 ENCOUNTER — Institutional Professional Consult (permissible substitution): Payer: Medicaid Other | Admitting: Clinical

## 2021-10-17 ENCOUNTER — Other Ambulatory Visit: Payer: Self-pay

## 2021-10-19 ENCOUNTER — Other Ambulatory Visit: Payer: Self-pay

## 2021-10-20 ENCOUNTER — Other Ambulatory Visit: Payer: Self-pay

## 2021-10-24 ENCOUNTER — Other Ambulatory Visit: Payer: Self-pay

## 2021-10-31 ENCOUNTER — Institutional Professional Consult (permissible substitution): Payer: Medicaid Other | Admitting: Clinical

## 2021-11-15 ENCOUNTER — Other Ambulatory Visit: Payer: Self-pay

## 2021-12-06 ENCOUNTER — Ambulatory Visit: Payer: Medicaid Other | Admitting: Critical Care Medicine
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
## 2021-12-08 ENCOUNTER — Other Ambulatory Visit: Payer: Self-pay | Admitting: Critical Care Medicine

## 2021-12-09 ENCOUNTER — Other Ambulatory Visit: Payer: Self-pay

## 2021-12-09 MED ORDER — GABAPENTIN 400 MG PO CAPS
ORAL_CAPSULE | ORAL | 1 refills | Status: DC
  Filled 2021-12-09: qty 180, 30d supply, fill #0
  Filled 2022-01-09: qty 180, 30d supply, fill #1
  Filled 2022-01-09: qty 180, 90d supply, fill #1

## 2021-12-09 NOTE — Telephone Encounter (Signed)
Requested Prescriptions  ?Pending Prescriptions Disp Refills  ?? gabapentin (NEURONTIN) 400 MG capsule 180 capsule 1  ?  Sig: Take 2 capsules by mouth three times daily  ?  ? Neurology: Anticonvulsants - gabapentin Passed - 12/08/2021  4:57 PM  ?  ?  Passed - Cr in normal range and within 360 days  ?  Creatinine, Ser  ?Date Value Ref Range Status  ?09/08/2021 0.97 0.76 - 1.27 mg/dL Final  ?   ?  ?  Passed - Completed PHQ-2 or PHQ-9 in the last 360 days  ?  ?  Passed - Valid encounter within last 12 months  ?  Recent Outpatient Visits   ?      ? 3 months ago Wernicke-Korsakoff syndrome (alcoholic) (HCC)  ? [facility] Storm Frisk, MD  ? 3 months ago Essential hypertension  ? [facility] Storm Frisk, MD  ? 8 months ago Chronic deep vein thrombosis (DVT) of femoral vein of right lower extremity (HCC)  ? [facility] Storm Frisk, MD  ? 1 year ago Chronic deep vein thrombosis (DVT) of femoral vein of right lower extremity (HCC)  ? [facility] Storm Frisk, MD  ? 1 year ago Noise-induced hearing loss of both ears  ? [facility] Flo Shanks, MD  ?  ?  ? ?  ?  ?  ? ?

## 2022-01-09 ENCOUNTER — Other Ambulatory Visit: Payer: Self-pay

## 2022-01-09 ENCOUNTER — Other Ambulatory Visit: Payer: Self-pay | Admitting: Critical Care Medicine

## 2022-01-10 ENCOUNTER — Other Ambulatory Visit: Payer: Self-pay

## 2022-01-10 MED ORDER — ALBUTEROL SULFATE HFA 108 (90 BASE) MCG/ACT IN AERS
2.0000 | INHALATION_SPRAY | Freq: Four times a day (QID) | RESPIRATORY_TRACT | 0 refills | Status: DC | PRN
  Filled 2022-01-10 – 2022-02-08 (×2): qty 18, 25d supply, fill #0

## 2022-01-16 ENCOUNTER — Other Ambulatory Visit: Payer: Self-pay

## 2022-02-08 ENCOUNTER — Other Ambulatory Visit: Payer: Self-pay | Admitting: Critical Care Medicine

## 2022-02-09 ENCOUNTER — Other Ambulatory Visit: Payer: Self-pay

## 2022-02-09 MED ORDER — GABAPENTIN 400 MG PO CAPS
ORAL_CAPSULE | ORAL | 2 refills | Status: DC
  Filled 2022-02-09: qty 180, 30d supply, fill #0
  Filled 2022-03-09: qty 180, 30d supply, fill #1
  Filled 2022-04-05: qty 180, 30d supply, fill #2

## 2022-02-09 NOTE — Telephone Encounter (Signed)
Requested Prescriptions  Pending Prescriptions Disp Refills  . gabapentin (NEURONTIN) 400 MG capsule 180 capsule 2    Sig: Take 2 capsules by mouth 3 times daily.     Neurology: Anticonvulsants - gabapentin Passed - 02/08/2022  8:21 PM      Passed - Cr in normal range and within 360 days    Creatinine, Ser  Date Value Ref Range Status  09/08/2021 0.97 0.76 - 1.27 mg/dL Final         Passed - Completed PHQ-2 or PHQ-9 in the last 360 days      Passed - Valid encounter within last 12 months    Recent Outpatient Visits          5 months ago Wernicke-Korsakoff syndrome (alcoholic) (HCC)   [facility] Storm Frisk, MD   5 months ago Essential hypertension   [facility] Storm Frisk, MD   10 months ago Chronic deep vein thrombosis (DVT) of femoral vein of right lower extremity [facility])   [place] [facility] Storm Frisk, MD   1 year ago Chronic deep vein thrombosis (DVT) of femoral vein of right lower extremity [facility])   [facility] Storm Frisk, MD   1 year ago Noise-induced hearing loss of both ears   [place] [facility] Flo Shanks, MD

## 2022-03-09 ENCOUNTER — Other Ambulatory Visit: Payer: Self-pay | Admitting: Critical Care Medicine

## 2022-03-09 ENCOUNTER — Other Ambulatory Visit: Payer: Self-pay

## 2022-03-09 MED ORDER — ALBUTEROL SULFATE HFA 108 (90 BASE) MCG/ACT IN AERS
2.0000 | INHALATION_SPRAY | Freq: Four times a day (QID) | RESPIRATORY_TRACT | 0 refills | Status: DC | PRN
  Filled 2022-03-09: qty 18, 25d supply, fill #0

## 2022-03-10 ENCOUNTER — Other Ambulatory Visit: Payer: Self-pay

## 2022-04-05 ENCOUNTER — Other Ambulatory Visit: Payer: Self-pay

## 2022-04-05 ENCOUNTER — Other Ambulatory Visit: Payer: Self-pay | Admitting: Critical Care Medicine

## 2022-04-05 MED ORDER — FOLIC ACID 1 MG PO TABS
ORAL_TABLET | ORAL | 0 refills | Status: DC
Start: 2022-04-05 — End: 2022-05-01
  Filled 2022-04-05: qty 30, 30d supply, fill #0

## 2022-04-17 ENCOUNTER — Other Ambulatory Visit: Payer: Self-pay | Admitting: Critical Care Medicine

## 2022-04-18 ENCOUNTER — Other Ambulatory Visit: Payer: Self-pay

## 2022-04-18 MED ORDER — ALBUTEROL SULFATE HFA 108 (90 BASE) MCG/ACT IN AERS
2.0000 | INHALATION_SPRAY | Freq: Four times a day (QID) | RESPIRATORY_TRACT | 0 refills | Status: DC | PRN
  Filled 2022-04-18 – 2022-04-29 (×2): qty 18, 25d supply, fill #0

## 2022-04-18 NOTE — Telephone Encounter (Signed)
Refilled 02/09/2022 #180 2 refills. Requested Prescriptions  Pending Prescriptions Disp Refills  . gabapentin (NEURONTIN) 400 MG capsule 180 capsule 2    Sig: Take 2 capsules by mouth 3 times daily.     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 albuterol (VENTOLIN HFA) 108 (90 Base) MCG/ACT inhaler 18 g 0    Sig: Inhale 2 puffs into the lungs once every 6 (six) hours as needed for wheezing or shortness of breath.     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

## 2022-04-18 NOTE — Telephone Encounter (Signed)
Requested Prescriptions  Pending Prescriptions Disp Refills  . albuterol (VENTOLIN HFA) 108 (90 Base) MCG/ACT inhaler 18 g 0    Sig: Inhale 2 puffs into the lungs once every 6 (six) hours as needed for wheezing or shortness of breath.     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 gabapentin (NEURONTIN) 400 MG capsule 180 capsule 2    Sig: Take 2 capsules by mouth 3 times daily.     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

## 2022-04-25 ENCOUNTER — Other Ambulatory Visit: Payer: Self-pay

## 2022-04-28 ENCOUNTER — Other Ambulatory Visit: Payer: Self-pay

## 2022-04-29 ENCOUNTER — Other Ambulatory Visit: Payer: Self-pay | Admitting: Critical Care Medicine

## 2022-05-01 ENCOUNTER — Other Ambulatory Visit: Payer: Self-pay | Admitting: Critical Care Medicine

## 2022-05-01 ENCOUNTER — Other Ambulatory Visit: Payer: Self-pay

## 2022-05-01 NOTE — Telephone Encounter (Signed)
Pt called and scheduled next opening with Dr. Joya Gaskins in December / pt needs refills until his appt date / please advise   Gabapentin  Folic [place]

## 2022-05-01 NOTE — Telephone Encounter (Signed)
Requested medication (s) are due for refill today:unsure  Requested medication (s) are on the active medication list: yes  Last refill:  02/09/22 and 04/05/22  Future visit scheduled: no  Notes to clinic:  Unable to refill per protocol, unsure of the correct sig for gabapentin, medication was refused due to too soon. Patient needs OV for refills, routing for review.     Requested Prescriptions  Pending Prescriptions Disp Refills   gabapentin (NEURONTIN) 400 MG capsule 180 capsule 2    Sig: Take 2 capsules by mouth 3 times daily.     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[place])   [place] Elsie Stain, MD   8 months ago Essential hypertension   [place] Elsie Stain, MD   1 year ago Chronic deep vein thrombosis (DVT) of femoral vein of right lower extremity [facility])   [place] Elsie Stain, MD   1 year ago Chronic deep vein thrombosis (DVT) of femoral vein of right lower extremity [facility])   [place] Elsie Stain, MD   1 year ago Noise-induced hearing loss of both [place], Ileene Hutchinson, MD               folic acid (FOLVITE) 1 MG tablet 30 tablet 0    Sig: TAKE 1 TABLET (1 MG TOTAL) BY MOUTH DAILY FOR 30 DAYS. PLEASE MAKE AN APPOINTMENT.     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

## 2022-05-02 ENCOUNTER — Other Ambulatory Visit: Payer: Self-pay

## 2022-05-02 MED ORDER — GABAPENTIN 400 MG PO CAPS
800.0000 mg | ORAL_CAPSULE | Freq: Three times a day (TID) | ORAL | 2 refills | Status: DC
  Filled 2022-05-02: qty 180, 30d supply, fill #0
  Filled 2022-05-17 – 2022-05-28 (×4): qty 180, 30d supply, fill #1
  Filled 2022-05-29: qty 144, 24d supply, fill #1
  Filled 2022-05-29: qty 36, 6d supply, fill #1
  Filled 2022-06-18 – 2022-06-20 (×2): qty 180, 30d supply, fill #2

## 2022-05-02 MED ORDER — FOLIC ACID 1 MG PO TABS
1.0000 mg | ORAL_TABLET | Freq: Every day | ORAL | 1 refills | Status: DC
  Filled 2022-05-02: qty 30, 30d supply, fill #0
  Filled 2022-05-28: qty 30, 30d supply, fill #1

## 2022-05-02 NOTE — Telephone Encounter (Signed)
Requested medication (s) are due for refill today: yes  Requested medication (s) are on the active medication list: yes  Last refill:  folic acid 0/2/54 #27/0, gabapentin 02/09/22 #180/2  Future visit scheduled: yes 07/19/22  Notes to clinic:  pt meets protocol for NT but wanted to make sure ok to fill.      Requested Prescriptions  Pending Prescriptions Disp Refills   gabapentin (NEURONTIN) 400 MG capsule 180 capsule 2    Sig: Take 2 capsules by mouth 3 times daily.     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[place])   [place] Elsie Stain, MD   8 months ago Essential hypertension   [place], Patrick E, MD   1 year ago Chronic deep vein thrombosis (DVT) of femoral vein of right lower extremity [facility])   [place] Elsie Stain, MD   1 year ago Chronic deep vein thrombosis (DVT) of femoral vein of right lower extremity [facility])   [place] Elsie Stain, MD   1 year ago Noise-induced hearing loss of both [place] Jodi Marble, MD       Future Appointments             In 2 months Elsie Stain, MD [place]             folic acid (FOLVITE) 1 MG tablet 30 tablet 0    Sig: TAKE 1 TABLET (1 MG TOTAL) BY MOUTH DAILY FOR 30 DAYS. PLEASE MAKE AN APPOINTMENT.     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

## 2022-05-03 ENCOUNTER — Other Ambulatory Visit: Payer: Self-pay

## 2022-05-15 ENCOUNTER — Other Ambulatory Visit: Payer: Self-pay

## 2022-05-18 ENCOUNTER — Other Ambulatory Visit: Payer: Self-pay

## 2022-05-22 ENCOUNTER — Other Ambulatory Visit: Payer: Self-pay

## 2022-05-24 ENCOUNTER — Other Ambulatory Visit (HOSPITAL_BASED_OUTPATIENT_CLINIC_OR_DEPARTMENT_OTHER): Payer: Self-pay

## 2022-05-25 ENCOUNTER — Other Ambulatory Visit: Payer: Self-pay

## 2022-05-28 ENCOUNTER — Other Ambulatory Visit: Payer: Self-pay | Admitting: Critical Care Medicine

## 2022-05-29 ENCOUNTER — Other Ambulatory Visit: Payer: Self-pay

## 2022-05-29 NOTE — Telephone Encounter (Signed)
Requested medication (s) are due for refill today: yes  Requested medication (s) are on the active medication list: yes  Last refill:  08/25/21  Future visit scheduled:yes  Notes to clinic:  Unable to refill per protocol, Patient comment: It says no refills so do I need to keep taking this      Requested Prescriptions  Pending Prescriptions Disp Refills   rivaroxaban (XARELTO) 20 MG TABS tablet 90 tablet 0    Sig: TAKE 1 TABLET (20 MG TOTAL) BY MOUTH ONCE DAILY WITH SUPPER. [place] PAK COMPLETE     Hematology: Anticoagulants - rivaroxaban Failed - 05/28/2022  4:27 PM      Failed - AST in normal range and within 360 days    AST  Date Value Ref Range Status  09/08/2021 88 (H) 0 - 40 IU/L Final         Passed - ALT in normal range and within 360 days    ALT  Date Value Ref Range Status  09/08/2021 43 0 - 44 IU/L Final         Passed - Cr in normal range and within 360 days    Creatinine, Ser  Date Value Ref Range Status  09/08/2021 0.97 0.76 - 1.27 mg/dL Final         Passed - HCT in normal range and within 360 days    Hematocrit  Date Value Ref Range Status  09/08/2021 46.4 37.5 - 51.0 % Final         Passed - HGB in normal range and within 360 days    Hemoglobin  Date Value Ref Range Status  09/08/2021 16.4 13.0 - 17.7 g/dL Final         Passed - PLT in normal range and within 360 days    Platelets  Date Value Ref Range Status  09/08/2021 213 150 - 450 x10E3/uL Final         Passed - eGFR is 15 or above and within 360 days    GFR calc Af Amer  Date Value Ref Range Status  07/14/2020 128 >59 mL/min/1.73 Final    Comment:    **In accordance with recommendations from the NKF-ASN Task force,**   [facility] is in the process of updating its eGFR calculation to the   2021 CKD-EPI creatinine equation that estimates kidney function   without a race variable.    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

## 2022-05-30 ENCOUNTER — Other Ambulatory Visit: Payer: Self-pay

## 2022-05-30 MED ORDER — RIVAROXABAN 20 MG PO TABS
20.0000 mg | ORAL_TABLET | Freq: Every day | ORAL | 1 refills | Status: DC
  Filled 2022-05-30 – 2022-06-06 (×2): qty 30, 30d supply, fill #0
  Filled 2022-07-03: qty 30, 30d supply, fill #1

## 2022-06-06 ENCOUNTER — Other Ambulatory Visit: Payer: Self-pay | Admitting: Critical Care Medicine

## 2022-06-06 ENCOUNTER — Other Ambulatory Visit: Payer: Self-pay

## 2022-06-07 ENCOUNTER — Other Ambulatory Visit: Payer: Self-pay

## 2022-06-07 MED ORDER — ALBUTEROL SULFATE HFA 108 (90 BASE) MCG/ACT IN AERS
2.0000 | INHALATION_SPRAY | Freq: Four times a day (QID) | RESPIRATORY_TRACT | 0 refills | Status: DC | PRN
  Filled 2022-06-07: qty 18, 25d supply, fill #0

## 2022-06-09 ENCOUNTER — Other Ambulatory Visit: Payer: Self-pay | Admitting: Critical Care Medicine

## 2022-06-09 ENCOUNTER — Other Ambulatory Visit: Payer: Self-pay

## 2022-06-09 MED ORDER — PANTOPRAZOLE SODIUM 40 MG PO TBEC
DELAYED_RELEASE_TABLET | Freq: Every day | ORAL | 0 refills | Status: DC
  Filled 2022-06-09: qty 30, 30d supply, fill #0

## 2022-06-09 MED ORDER — METOPROLOL TARTRATE 50 MG PO TABS
50.0000 mg | ORAL_TABLET | Freq: Two times a day (BID) | ORAL | 0 refills | Status: DC
  Filled 2022-06-09: qty 60, 30d supply, fill #0

## 2022-06-19 ENCOUNTER — Other Ambulatory Visit: Payer: Self-pay

## 2022-06-20 ENCOUNTER — Other Ambulatory Visit: Payer: Self-pay

## 2022-07-03 ENCOUNTER — Other Ambulatory Visit: Payer: Self-pay

## 2022-07-03 ENCOUNTER — Other Ambulatory Visit: Payer: Self-pay | Admitting: Critical Care Medicine

## 2022-07-03 MED ORDER — METOPROLOL TARTRATE 50 MG PO TABS
50.0000 mg | ORAL_TABLET | Freq: Two times a day (BID) | ORAL | 0 refills | Status: DC
  Filled 2022-07-03: qty 60, 30d supply, fill #0

## 2022-07-09 ENCOUNTER — Other Ambulatory Visit: Payer: Self-pay | Admitting: Critical Care Medicine

## 2022-07-10 ENCOUNTER — Other Ambulatory Visit: Payer: Self-pay

## 2022-07-10 ENCOUNTER — Other Ambulatory Visit: Payer: Self-pay | Admitting: Critical Care Medicine

## 2022-07-10 MED ORDER — GABAPENTIN 400 MG PO CAPS
800.0000 mg | ORAL_CAPSULE | Freq: Three times a day (TID) | ORAL | 2 refills | Status: DC
  Filled 2022-07-10 – 2022-07-12 (×6): qty 180, 30d supply, fill #0
  Filled 2022-08-01 – 2022-08-06 (×2): qty 180, 30d supply, fill #1
  Filled 2022-08-30 – 2022-08-31 (×2): qty 180, 30d supply, fill #2

## 2022-07-10 MED ORDER — PANTOPRAZOLE SODIUM 40 MG PO TBEC
40.0000 mg | DELAYED_RELEASE_TABLET | Freq: Every day | ORAL | 0 refills | Status: DC
  Filled 2022-07-10: qty 30, 30d supply, fill #0

## 2022-07-11 ENCOUNTER — Other Ambulatory Visit: Payer: Self-pay

## 2022-07-12 ENCOUNTER — Other Ambulatory Visit: Payer: Self-pay

## 2022-07-17 ENCOUNTER — Other Ambulatory Visit: Payer: Self-pay

## 2022-07-19 ENCOUNTER — Ambulatory Visit: Payer: Medicaid Other | Admitting: Critical Care Medicine

## 2022-08-01 ENCOUNTER — Other Ambulatory Visit: Payer: Self-pay | Admitting: Critical Care Medicine

## 2022-08-01 MED ORDER — METOPROLOL TARTRATE 50 MG PO TABS
50.0000 mg | ORAL_TABLET | Freq: Two times a day (BID) | ORAL | 0 refills | Status: DC
  Filled 2022-08-01: qty 60, 30d supply, fill #0

## 2022-08-01 MED ORDER — PANTOPRAZOLE SODIUM 40 MG PO TBEC
40.0000 mg | DELAYED_RELEASE_TABLET | Freq: Every day | ORAL | 0 refills | Status: DC
  Filled 2022-08-01: qty 30, 30d supply, fill #0

## 2022-08-01 MED ORDER — RIVAROXABAN 20 MG PO TABS
20.0000 mg | ORAL_TABLET | Freq: Every day | ORAL | 0 refills | Status: DC
  Filled 2022-08-01: qty 30, 30d supply, fill #0

## 2022-08-02 ENCOUNTER — Other Ambulatory Visit: Payer: Self-pay

## 2022-08-06 ENCOUNTER — Other Ambulatory Visit: Payer: Self-pay | Admitting: Critical Care Medicine

## 2022-08-07 ENCOUNTER — Encounter: Payer: Self-pay | Admitting: Physician Assistant

## 2022-08-07 ENCOUNTER — Other Ambulatory Visit: Payer: Self-pay

## 2022-08-07 ENCOUNTER — Ambulatory Visit: Payer: Medicaid Other | Attending: Critical Care Medicine | Admitting: Physician Assistant

## 2022-08-07 VITALS — BP 159/92 | HR 92 | Wt 290.8 lb

## 2022-08-07 DIAGNOSIS — Z760 Encounter for issue of repeat prescription: Secondary | ICD-10-CM | POA: Diagnosis not present

## 2022-08-07 DIAGNOSIS — E785 Hyperlipidemia, unspecified: Secondary | ICD-10-CM

## 2022-08-07 DIAGNOSIS — G629 Polyneuropathy, unspecified: Secondary | ICD-10-CM

## 2022-08-07 DIAGNOSIS — Z23 Encounter for immunization: Secondary | ICD-10-CM | POA: Diagnosis not present

## 2022-08-07 DIAGNOSIS — S30811A Abrasion of abdominal wall, initial encounter: Secondary | ICD-10-CM | POA: Diagnosis not present

## 2022-08-07 DIAGNOSIS — Z7901 Long term (current) use of anticoagulants: Secondary | ICD-10-CM

## 2022-08-07 DIAGNOSIS — Z79899 Other long term (current) drug therapy: Secondary | ICD-10-CM | POA: Diagnosis not present

## 2022-08-07 DIAGNOSIS — X58XXXA Exposure to other specified factors, initial encounter: Secondary | ICD-10-CM | POA: Insufficient documentation

## 2022-08-07 DIAGNOSIS — D649 Anemia, unspecified: Secondary | ICD-10-CM

## 2022-08-07 DIAGNOSIS — R7989 Other specified abnormal findings of blood chemistry: Secondary | ICD-10-CM

## 2022-08-07 DIAGNOSIS — I10 Essential (primary) hypertension: Secondary | ICD-10-CM

## 2022-08-07 DIAGNOSIS — Z131 Encounter for screening for diabetes mellitus: Secondary | ICD-10-CM | POA: Diagnosis not present

## 2022-08-07 MED ORDER — VITAMIN B-1 250 MG PO TABS
250.0000 mg | ORAL_TABLET | Freq: Every day | ORAL | 5 refills | Status: DC
  Filled 2022-08-07 – 2022-09-04 (×12): qty 60, 60d supply, fill #0

## 2022-08-07 MED ORDER — PANTOPRAZOLE SODIUM 40 MG PO TBEC
40.0000 mg | DELAYED_RELEASE_TABLET | Freq: Every day | ORAL | 1 refills | Status: DC
  Filled 2022-09-04 – 2022-09-12 (×2): qty 90, 90d supply, fill #0

## 2022-08-07 MED ORDER — METOPROLOL TARTRATE 50 MG PO TABS
50.0000 mg | ORAL_TABLET | Freq: Two times a day (BID) | ORAL | 0 refills | Status: DC
  Filled 2022-08-27 – 2022-08-31 (×3): qty 60, 30d supply, fill #0

## 2022-08-07 MED ORDER — MUPIROCIN 2 % EX OINT
1.0000 | TOPICAL_OINTMENT | Freq: Two times a day (BID) | CUTANEOUS | 0 refills | Status: DC
  Filled 2022-08-07: qty 22, 11d supply, fill #0

## 2022-08-07 MED ORDER — ALBUTEROL SULFATE HFA 108 (90 BASE) MCG/ACT IN AERS
2.0000 | INHALATION_SPRAY | Freq: Four times a day (QID) | RESPIRATORY_TRACT | 1 refills | Status: DC | PRN
  Filled 2022-08-07: qty 18, 25d supply, fill #0

## 2022-08-07 MED ORDER — VALSARTAN-HYDROCHLOROTHIAZIDE 160-25 MG PO TABS
1.0000 | ORAL_TABLET | Freq: Every day | ORAL | 3 refills | Status: DC
  Filled 2022-08-07 – 2022-09-04 (×4): qty 90, 90d supply, fill #0

## 2022-08-07 MED ORDER — PYRIDOXINE HCL 500 MG PO TABS
500.0000 mg | ORAL_TABLET | Freq: Every day | ORAL | 4 refills | Status: DC
  Filled 2022-08-07 – 2022-09-04 (×12): qty 60, 60d supply, fill #0

## 2022-08-07 MED ORDER — GABAPENTIN 400 MG PO CAPS
800.0000 mg | ORAL_CAPSULE | Freq: Three times a day (TID) | ORAL | 3 refills | Status: DC
  Filled 2022-08-16 – 2022-09-12 (×17): qty 180, 30d supply, fill #0

## 2022-08-07 MED ORDER — EZETIMIBE 10 MG PO TABS
10.0000 mg | ORAL_TABLET | Freq: Every day | ORAL | 3 refills | Status: DC
  Filled 2022-08-29: qty 90, 90d supply, fill #0
  Filled 2022-09-04: qty 90, 90d supply, fill #1

## 2022-08-07 MED ORDER — RIVAROXABAN 20 MG PO TABS
20.0000 mg | ORAL_TABLET | Freq: Every day | ORAL | 5 refills | Status: DC
  Filled 2022-08-28: qty 30, 30d supply, fill #0
  Filled 2022-09-04: qty 30, 30d supply, fill #1
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
## 2022-08-07 NOTE — Patient Instructions (Signed)
Sit still and quiet for 5 mins with deep breathing and check blood pressure daily and record.  Goal is <130/85

## 2022-08-16 ENCOUNTER — Telehealth: Payer: Self-pay | Admitting: Emergency Medicine

## 2022-08-16 ENCOUNTER — Other Ambulatory Visit: Payer: Self-pay

## 2022-08-16 ENCOUNTER — Other Ambulatory Visit: Payer: Self-pay | Admitting: Physician Assistant

## 2022-08-16 DIAGNOSIS — Z131 Encounter for screening for diabetes mellitus: Secondary | ICD-10-CM

## 2022-08-16 DIAGNOSIS — I10 Essential (primary) hypertension: Secondary | ICD-10-CM

## 2022-08-16 DIAGNOSIS — E782 Mixed hyperlipidemia: Secondary | ICD-10-CM

## 2022-08-16 DIAGNOSIS — R7989 Other specified abnormal findings of blood chemistry: Secondary | ICD-10-CM

## 2022-08-16 NOTE — Telephone Encounter (Signed)
Copied from [place] [phone]. Topic: General - Other >> Aug 15, 2022  4:33 PM [place] B wrote: Reason for CRM: patient's wife called in requesting that a meld level be done to check his liver.

## 2022-08-16 NOTE — Telephone Encounter (Signed)
Called patient and got him scheduled to do labs  Talked with angela to reorders the labs for draw !

## 2022-08-17 ENCOUNTER — Ambulatory Visit: Payer: Medicaid Other | Attending: Critical Care Medicine

## 2022-08-17 DIAGNOSIS — I10 Essential (primary) hypertension: Secondary | ICD-10-CM

## 2022-08-17 DIAGNOSIS — E782 Mixed hyperlipidemia: Secondary | ICD-10-CM

## 2022-08-17 DIAGNOSIS — R7989 Other specified abnormal findings of blood chemistry: Secondary | ICD-10-CM

## 2022-08-17 DIAGNOSIS — Z131 Encounter for screening for diabetes mellitus: Secondary | ICD-10-CM

## 2022-08-18 ENCOUNTER — Other Ambulatory Visit: Payer: Self-pay

## 2022-08-18 ENCOUNTER — Other Ambulatory Visit: Payer: Self-pay | Admitting: Physician Assistant

## 2022-08-18 ENCOUNTER — Encounter: Payer: Self-pay | Admitting: Critical Care Medicine

## 2022-08-18 DIAGNOSIS — E1165 Type 2 diabetes mellitus with hyperglycemia: Secondary | ICD-10-CM

## 2022-08-18 LAB — CBC WITH DIFFERENTIAL/PLATELET
Basophils Absolute: 0.1 10*3/uL (ref 0.0–0.2)
Basos: 1 %
EOS (ABSOLUTE): 0.2 10*3/uL (ref 0.0–0.4)
Eos: 2 %
Hematocrit: 44.8 % (ref 37.5–51.0)
Hemoglobin: 15.7 g/dL (ref 13.0–17.7)
Immature Grans (Abs): 0.1 10*3/uL (ref 0.0–0.1)
Immature Granulocytes: 1 %
Lymphocytes Absolute: 3.7 10*3/uL — ABNORMAL HIGH (ref 0.7–3.1)
Lymphs: 33 %
MCH: 34 pg — ABNORMAL HIGH (ref 26.6–33.0)
MCHC: 35 g/dL (ref 31.5–35.7)
MCV: 97 fL (ref 79–97)
Monocytes Absolute: 1.3 10*3/uL — ABNORMAL HIGH (ref 0.1–0.9)
Monocytes: 11 %
Neutrophils Absolute: 5.9 10*3/uL (ref 1.4–7.0)
Neutrophils: 52 %
Platelets: 323 10*3/uL (ref 150–450)
RBC: 4.62 x10E6/uL (ref 4.14–5.80)
RDW: 11.8 % (ref 11.6–15.4)
WBC: 11.3 10*3/uL — ABNORMAL HIGH (ref 3.4–10.8)

## 2022-08-18 LAB — COMPREHENSIVE METABOLIC PANEL
ALT: 29 IU/L (ref 0–44)
AST: 38 IU/L (ref 0–40)
Albumin/Globulin Ratio: 1.4 (ref 1.2–2.2)
Albumin: 4.6 g/dL (ref 4.1–5.1)
Alkaline Phosphatase: 105 IU/L (ref 44–121)
BUN/Creatinine Ratio: 12 (ref 9–20)
BUN: 11 mg/dL (ref 6–24)
Bilirubin Total: 0.6 mg/dL (ref 0.0–1.2)
CO2: 26 mmol/L (ref 20–29)
Calcium: 10.1 mg/dL (ref 8.7–10.2)
Chloride: 90 mmol/L — ABNORMAL LOW (ref 96–106)
Creatinine, Ser: 0.91 mg/dL (ref 0.76–1.27)
Globulin, Total: 3.2 g/dL (ref 1.5–4.5)
Glucose: 151 mg/dL — ABNORMAL HIGH (ref 70–99)
Potassium: 4.1 mmol/L (ref 3.5–5.2)
Sodium: 131 mmol/L — ABNORMAL LOW (ref 134–144)
Total Protein: 7.8 g/dL (ref 6.0–8.5)
eGFR: 103 mL/min/{1.73_m2} (ref >59)

## 2022-08-18 LAB — HEMOGLOBIN A1C
Est. average glucose Bld gHb Est-mCnc: 151 mg/dL
Hgb A1c MFr Bld: 6.9 % — ABNORMAL HIGH (ref 4.8–5.6)

## 2022-08-18 LAB — LIPID PANEL
Chol/HDL Ratio: 4.3 ratio (ref 0.0–5.0)
Cholesterol, Total: 187 mg/dL (ref 100–199)
HDL: 43 mg/dL (ref >39)
LDL Chol Calc (NIH): 114 mg/dL — ABNORMAL HIGH (ref 0–99)
Triglycerides: 171 mg/dL — ABNORMAL HIGH (ref 0–149)
VLDL Cholesterol Cal: 30 mg/dL (ref 5–40)

## 2022-08-18 MED ORDER — METFORMIN HCL 500 MG PO TABS
500.0000 mg | ORAL_TABLET | Freq: Two times a day (BID) | ORAL | 3 refills | Status: DC
  Filled 2022-08-18: qty 180, 90d supply, fill #0

## 2022-08-21 ENCOUNTER — Other Ambulatory Visit: Payer: Self-pay

## 2022-08-22 ENCOUNTER — Telehealth: Payer: Self-pay

## 2022-08-22 NOTE — Telephone Encounter (Signed)
Called patient and followed up on results he has seen them in [place] and has already pick script up

## 2022-08-22 NOTE — Telephone Encounter (Signed)
-----  Message from Carilyn Goodpasture, RN sent at 08/18/2022  2:48 PM EST -----  ----- Message ----- From: [place] Donovan, PA-C Sent: 08/18/2022  10:06 AM EST To: Carilyn Goodpasture, RN  Mr W[place] have developed diabetes.  I am sending you a prescription of metformin to reduce your blood sugars.  Please eliminate sugars and starches from your diet.  Increasing exercise and weight loss will also help.  Your sodium is low-likely due to your blood sugars.  Cholesterol is a little elevated.  I recommend low fat/low sugar diet.  Increase lean meats and non-starchy vegetables.  Drink 80-100 ounces water daily.  Please call ahead and schedule an appt with Dr Joya Gaskins for 3 months from now.  Thanks, Freeman Caldron, PA-C

## 2022-08-23 ENCOUNTER — Other Ambulatory Visit: Payer: Self-pay

## 2022-08-23 NOTE — Telephone Encounter (Signed)
Pt July appt needs to be moved up to February

## 2022-08-25 ENCOUNTER — Other Ambulatory Visit: Payer: Self-pay

## 2022-08-26 ENCOUNTER — Telehealth: Payer: Self-pay | Admitting: Critical Care Medicine

## 2022-08-28 ENCOUNTER — Other Ambulatory Visit: Payer: Self-pay

## 2022-08-28 NOTE — Telephone Encounter (Signed)
noted 

## 2022-08-28 NOTE — Telephone Encounter (Signed)
[facility] called and spoke to Kila, [employer] about the refill(s) Xarelto requested. Advised it was sent on 08/07/22 #30/5 refill(s). She states that pt called in wrong rx # and didn't transfer to new script. Processed for refill today and will be available for pick up this evening.   Requested Prescriptions  Pending Prescriptions Disp Refills   rivaroxaban (XARELTO) 20 MG TABS tablet 30 tablet 0    Sig: Take 1 tablet (20 mg total) by mouth daily with supper for 6 days.     Hematology: Anticoagulants - rivaroxaban Passed - 08/26/2022  7:22 PM      Passed - ALT in normal range and within 360 days    ALT  Date Value Ref Range Status  08/17/2022 29 0 - 44 IU/L Final         Passed - AST in normal range and within 360 days    AST  Date Value Ref Range Status  08/17/2022 38 0 - 40 IU/L Final         Passed - Cr in normal range and within 360 days    Creatinine, Ser  Date Value Ref Range Status  08/17/2022 0.91 0.76 - 1.27 mg/dL Final         Passed - HCT in normal range and within 360 days    Hematocrit  Date Value Ref Range Status  08/17/2022 44.8 37.5 - 51.0 % Final         Passed - HGB in normal range and within 360 days    Hemoglobin  Date Value Ref Range Status  08/17/2022 15.7 13.0 - 17.7 g/dL Final         Passed - PLT in normal range and within 360 days    Platelets  Date Value Ref Range Status  08/17/2022 323 150 - 450 x10E3/uL Final         Passed - eGFR is 15 or above and within 360 days    GFR calc Af Amer  Date Value Ref Range Status  07/14/2020 128 >59 mL/min/1.73 Final    Comment:    **In accordance with recommendations from the NKF-ASN Task force,**   [facility] is in the process of updating its eGFR calculation to the   2021 CKD-EPI creatinine equation that estimates kidney function   without a race variable.    GFR, Estimated  Date Value Ref Range Status  07/09/2021 >60 >60 mL/min Final    Comment:    (NOTE) Calculated using the CKD-EPI  Creatinine Equation (2021)    eGFR  Date Value Ref Range Status  08/17/2022 103 >59 mL/min/1.73 Final         Passed - Patient is not pregnant      Passed - Valid encounter within last 12 months    Recent Outpatient Visits           3 weeks ago Need for immunization against influenza   [place], [place], [place]   11 months ago Wernicke-Korsakoff syndrome (alcoholic) [facility])   [place] Elsie Stain, MD   1 year ago Essential hypertension   [place] Elsie Stain, MD   1 year ago Chronic deep vein thrombosis (DVT) of femoral vein of right lower extremity [facility])   [place] Elsie Stain, MD   1 year ago Chronic deep vein thrombosis (DVT) of femoral vein of right  lower extremity [facility])   [place], MD       Future Appointments             In 2 weeks Joya Gaskins Burnett Harry, MD [place]

## 2022-08-29 ENCOUNTER — Other Ambulatory Visit: Payer: Self-pay

## 2022-08-30 ENCOUNTER — Other Ambulatory Visit: Payer: Self-pay

## 2022-08-31 ENCOUNTER — Other Ambulatory Visit: Payer: Self-pay

## 2022-09-04 ENCOUNTER — Other Ambulatory Visit: Payer: Self-pay | Admitting: Physician Assistant

## 2022-09-04 ENCOUNTER — Other Ambulatory Visit: Payer: Self-pay | Admitting: Critical Care Medicine

## 2022-09-04 DIAGNOSIS — I10 Essential (primary) hypertension: Secondary | ICD-10-CM

## 2022-09-05 ENCOUNTER — Encounter: Payer: Self-pay | Admitting: Critical Care Medicine

## 2022-09-05 ENCOUNTER — Other Ambulatory Visit: Payer: Self-pay

## 2022-09-05 MED ORDER — CLONIDINE HCL 0.1 MG PO TABS
0.1000 mg | ORAL_TABLET | Freq: Two times a day (BID) | ORAL | 2 refills | Status: DC
  Filled 2022-09-05 – 2022-09-12 (×2): qty 60, 30d supply, fill #0

## 2022-09-05 MED ORDER — METOPROLOL TARTRATE 50 MG PO TABS
50.0000 mg | ORAL_TABLET | Freq: Two times a day (BID) | ORAL | 0 refills | Status: DC
  Filled 2022-09-05: qty 60, 30d supply, fill #0

## 2022-09-06 ENCOUNTER — Other Ambulatory Visit: Payer: Self-pay

## 2022-09-12 ENCOUNTER — Other Ambulatory Visit: Payer: Self-pay

## 2022-09-13 ENCOUNTER — Other Ambulatory Visit: Payer: Self-pay

## 2022-09-14 ENCOUNTER — Ambulatory Visit: Payer: Medicaid Other | Attending: Critical Care Medicine | Admitting: Critical Care Medicine

## 2022-09-14 ENCOUNTER — Other Ambulatory Visit: Payer: Self-pay

## 2022-09-14 ENCOUNTER — Encounter: Payer: Self-pay | Admitting: Critical Care Medicine

## 2022-09-14 VITALS — BP 155/104 | HR 78 | Ht 74.0 in | Wt 287.0 lb

## 2022-09-14 DIAGNOSIS — Z23 Encounter for immunization: Secondary | ICD-10-CM

## 2022-09-14 DIAGNOSIS — I10 Essential (primary) hypertension: Secondary | ICD-10-CM | POA: Insufficient documentation

## 2022-09-14 DIAGNOSIS — F17200 Nicotine dependence, unspecified, uncomplicated: Secondary | ICD-10-CM

## 2022-09-14 DIAGNOSIS — E785 Hyperlipidemia, unspecified: Secondary | ICD-10-CM | POA: Diagnosis not present

## 2022-09-14 DIAGNOSIS — I82511 Chronic embolism and thrombosis of right femoral vein: Secondary | ICD-10-CM

## 2022-09-14 DIAGNOSIS — E1165 Type 2 diabetes mellitus with hyperglycemia: Secondary | ICD-10-CM | POA: Insufficient documentation

## 2022-09-14 DIAGNOSIS — F1090 Alcohol use, unspecified, uncomplicated: Secondary | ICD-10-CM | POA: Insufficient documentation

## 2022-09-14 DIAGNOSIS — F1096 Alcohol use, unspecified with alcohol-induced persisting amnestic disorder: Secondary | ICD-10-CM

## 2022-09-14 DIAGNOSIS — F17210 Nicotine dependence, cigarettes, uncomplicated: Secondary | ICD-10-CM | POA: Diagnosis not present

## 2022-09-14 DIAGNOSIS — K7031 Alcoholic cirrhosis of liver with ascites: Secondary | ICD-10-CM | POA: Diagnosis not present

## 2022-09-14 DIAGNOSIS — R7989 Other specified abnormal findings of blood chemistry: Secondary | ICD-10-CM

## 2022-09-14 DIAGNOSIS — I82501 Chronic embolism and thrombosis of unspecified deep veins of right lower extremity: Secondary | ICD-10-CM | POA: Diagnosis present

## 2022-09-14 DIAGNOSIS — Z789 Other specified health status: Secondary | ICD-10-CM

## 2022-09-14 DIAGNOSIS — Z122 Encounter for screening for malignant neoplasm of respiratory organs: Secondary | ICD-10-CM

## 2022-09-14 DIAGNOSIS — Z95828 Presence of other vascular implants and grafts: Secondary | ICD-10-CM

## 2022-09-14 MED ORDER — SPIRONOLACTONE 25 MG PO TABS
25.0000 mg | ORAL_TABLET | Freq: Every day | ORAL | 2 refills | Status: DC
  Filled 2022-09-14: qty 30, 30d supply, fill #0
  Filled 2022-10-12: qty 30, 30d supply, fill #1

## 2022-09-14 MED ORDER — VITAMIN B-1 250 MG PO TABS
250.0000 mg | ORAL_TABLET | Freq: Every day | ORAL | 5 refills | Status: DC
  Filled 2022-09-14 – 2022-10-17 (×5): qty 60, 60d supply, fill #0

## 2022-09-14 MED ORDER — RIVAROXABAN 20 MG PO TABS
20.0000 mg | ORAL_TABLET | Freq: Every day | ORAL | 2 refills | Status: DC
  Filled 2022-09-14: qty 30, 30d supply, fill #0
  Filled 2022-10-02: qty 60, 60d supply, fill #0
  Filled 2022-12-03: qty 60, 60d supply, fill #1

## 2022-09-14 MED ORDER — PANTOPRAZOLE SODIUM 40 MG PO TBEC
40.0000 mg | DELAYED_RELEASE_TABLET | Freq: Every day | ORAL | 2 refills | Status: DC
  Filled 2022-09-14: qty 30, 30d supply, fill #0
  Filled 2022-10-12: qty 30, 30d supply, fill #1

## 2022-09-14 MED ORDER — VALSARTAN 320 MG PO TABS
320.0000 mg | ORAL_TABLET | Freq: Every day | ORAL | 3 refills | Status: DC
  Filled 2022-09-14: qty 30, 30d supply, fill #0
  Filled 2022-10-12: qty 30, 30d supply, fill #1

## 2022-09-14 MED ORDER — GABAPENTIN 400 MG PO CAPS
800.0000 mg | ORAL_CAPSULE | Freq: Three times a day (TID) | ORAL | 2 refills | Status: DC
  Filled 2022-09-14 – 2022-09-28 (×5): qty 180, 30d supply, fill #0
  Filled 2022-10-12 – 2022-10-26 (×4): qty 180, 30d supply, fill #1
  Filled 2022-11-10 – 2022-11-14 (×7): qty 180, 30d supply, fill #2
  Filled ????-??-??: fill #2

## 2022-09-14 MED ORDER — PYRIDOXINE HCL 500 MG PO TABS
500.0000 mg | ORAL_TABLET | Freq: Every day | ORAL | 4 refills | Status: DC
  Filled 2022-09-14 – 2022-12-15 (×14): qty 60, 60d supply, fill #0

## 2022-09-14 MED ORDER — CLONIDINE HCL 0.1 MG PO TABS
0.1000 mg | ORAL_TABLET | Freq: Two times a day (BID) | ORAL | 2 refills | Status: DC
  Filled 2022-10-12: qty 60, 30d supply, fill #0

## 2022-09-14 MED ORDER — EZETIMIBE 10 MG PO TABS
10.0000 mg | ORAL_TABLET | Freq: Every day | ORAL | 3 refills | Status: DC
  Filled 2022-09-14 – 2022-12-03 (×2): qty 90, 90d supply, fill #0

## 2022-09-14 MED ORDER — FOLIC ACID 1 MG PO TABS
1.0000 mg | ORAL_TABLET | Freq: Every day | ORAL | 2 refills | Status: DC
  Filled 2022-09-14: qty 30, 30d supply, fill #0
  Filled 2022-10-12: qty 30, 30d supply, fill #1
  Filled 2022-11-11 – 2022-11-13 (×3): qty 30, 30d supply, fill #2
  Filled 2022-12-12: qty 30, 30d supply, fill #3

## 2022-09-14 MED ORDER — ALBUTEROL SULFATE HFA 108 (90 BASE) MCG/ACT IN AERS
2.0000 | INHALATION_SPRAY | Freq: Four times a day (QID) | RESPIRATORY_TRACT | 1 refills | Status: DC | PRN
  Filled 2022-09-14: qty 18, 25d supply, fill #0
  Filled 2022-10-18: qty 18, 25d supply, fill #1

## 2022-09-14 NOTE — Assessment & Plan Note (Signed)
Maintain Xarelto hemoglobin normal in January

## 2022-09-14 NOTE — Assessment & Plan Note (Signed)
Counseled against further alcohol use will connect him to resources I do not think he will need to be detoxed

## 2022-09-14 NOTE — Assessment & Plan Note (Signed)
Elevated blood pressure will continue valsartan  daily and continue clonidine and institute Aldactone 25 mg daily for ascites

## 2022-09-14 NOTE — Assessment & Plan Note (Signed)
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

## 2022-09-14 NOTE — Assessment & Plan Note (Signed)
Check ultrasound of liver and reassess liver function

## 2022-09-14 NOTE — Assessment & Plan Note (Signed)
Continue Zetia he cannot use statins because of liver

## 2022-09-14 NOTE — Assessment & Plan Note (Signed)
Plan to stop metformin and recheck glucose and A1c

## 2022-09-14 NOTE — Assessment & Plan Note (Signed)
Given smoking history has high risk for lung cancer with shared decision making patient wishes to proceed with lung cancer screening

## 2022-09-14 NOTE — Patient Instructions (Signed)
Stop metformin and we are going to rescreen you for diabetes today  Start Aldactone 1 daily for fluid Stop valsartan HCT and begin valsartan 320 mg daily  All other medications were refilled  Labs include metabolic panel urine for protein  Lung cancer screening study will be ordered  Referral to gastroenterology be made for colonoscopy and liver disease assessment  Shingles vaccine given   Change dressing on wound on abdomen twice daily for 5 days then stop and leave open to air  Take a zinc 50 mg tablet daily for 3 weeks you can get this over-the-counter  We discussed smoking cessation but more importantly alcohol cessation  My clinical social worker will call you regarding alcohol counseling options  Return to Dr. Joya Gaskins 1 month
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
## 2022-09-14 NOTE — Assessment & Plan Note (Signed)
Still having difficulty with memory plan to continue thiamine folic acid and pyridoxine

## 2022-09-14 NOTE — Assessment & Plan Note (Signed)
Suspect development of cirrhosis will refer to gastroenterology and start Aldactone

## 2022-09-14 NOTE — Progress Notes (Signed)
Metformin causes headache.

## 2022-09-14 NOTE — Assessment & Plan Note (Signed)
Not committed to stopping smoking at this time    Current smoking consumption amount: 1 pack a day  Dicsussion on advise to quit smoking and smoking impacts: Lung impacts  Patient's willingness to quit: Not willing to quit  Methods to quit smoking discussed: Not discussed  Medication management of smoking session drugs discussed: Failed Resources provided:  AVS   Setting quit date not established  Follow-up arranged 2 months  Time spent counseling the patient: 5 minutes

## 2022-09-15 ENCOUNTER — Other Ambulatory Visit: Payer: Self-pay

## 2022-09-15 ENCOUNTER — Other Ambulatory Visit: Payer: Self-pay | Admitting: Critical Care Medicine

## 2022-09-15 LAB — MICROALBUMIN / CREATININE URINE RATIO
Creatinine, Urine: 157.9 mg/dL
Microalb/Creat Ratio: 15 mg/g creat (ref 0–29)
Microalbumin, Urine: 23.4 ug/mL

## 2022-09-15 LAB — COMPREHENSIVE METABOLIC PANEL
ALT: 30 IU/L (ref 0–44)
AST: 38 IU/L (ref 0–40)
Albumin/Globulin Ratio: 1.5 (ref 1.2–2.2)
Albumin: 4.7 g/dL (ref 4.1–5.1)
Alkaline Phosphatase: 101 IU/L (ref 44–121)
BUN/Creatinine Ratio: 12 (ref 9–20)
BUN: 12 mg/dL (ref 6–24)
Bilirubin Total: 0.7 mg/dL (ref 0.0–1.2)
CO2: 21 mmol/L (ref 20–29)
Calcium: 9.7 mg/dL (ref 8.7–10.2)
Chloride: 94 mmol/L — ABNORMAL LOW (ref 96–106)
Creatinine, Ser: 0.97 mg/dL (ref 0.76–1.27)
Globulin, Total: 3.1 g/dL (ref 1.5–4.5)
Glucose: 129 mg/dL — ABNORMAL HIGH (ref 70–99)
Potassium: 4.2 mmol/L (ref 3.5–5.2)
Sodium: 133 mmol/L — ABNORMAL LOW (ref 134–144)
Total Protein: 7.8 g/dL (ref 6.0–8.5)
eGFR: 95 mL/min/{1.73_m2} (ref >59)

## 2022-09-15 LAB — HEMOGLOBIN A1C
Est. average glucose Bld gHb Est-mCnc: 151 mg/dL
Hgb A1c MFr Bld: 6.9 % — ABNORMAL HIGH (ref 4.8–5.6)

## 2022-09-15 MED ORDER — GLIPIZIDE 5 MG PO TABS
5.0000 mg | ORAL_TABLET | Freq: Two times a day (BID) | ORAL | 3 refills | Status: DC
  Filled 2022-09-15: qty 60, 30d supply, fill #0
  Filled 2022-10-12: qty 60, 30d supply, fill #1
  Filled 2022-11-11 – 2022-11-13 (×2): qty 60, 30d supply, fill #2
  Filled 2022-12-12: qty 60, 30d supply, fill #3

## 2022-09-15 NOTE — Progress Notes (Signed)
Let pt know liver kidney function normal he still has high blood sugar and high A1C so needs to be on another blood sugar pill  I sent order in for same  glipizide to take twice daily with food, urine test normal no protein in urine

## 2022-09-18 ENCOUNTER — Telehealth: Payer: Self-pay | Admitting: Critical Care Medicine

## 2022-09-18 ENCOUNTER — Other Ambulatory Visit: Payer: Self-pay

## 2022-09-18 NOTE — Telephone Encounter (Signed)
Needs E[place] counseling and outpatient resources  he is down to 2 beers per day and shot of whiskey    I do not think he will have withdrawal sxs so does not need detox

## 2022-09-19 ENCOUNTER — Other Ambulatory Visit: Payer: Self-pay

## 2022-09-19 ENCOUNTER — Telehealth: Payer: Self-pay

## 2022-09-19 NOTE — Telephone Encounter (Signed)
Called patient and he is aware. 

## 2022-09-19 NOTE — Telephone Encounter (Signed)
-----   Message from Elsie Stain, MD sent at 09/15/2022  4:04 PM EST ----- Let pt know liver kidney function normal he still has high blood sugar and high A1C so needs to be on another blood sugar pill  I sent order in for same  glipizide to take twice daily with food, urine test normal no protein in urine

## 2022-09-19 NOTE — Telephone Encounter (Signed)
I had sent new set of refills to our pharmacy [facility] for gabapentin he should call them to start a new refill

## 2022-09-19 NOTE — Telephone Encounter (Signed)
Pt was called and is aware of results, DOB was confirmed.  ?

## 2022-09-21 NOTE — Telephone Encounter (Signed)
Please follow up and evaluate clients needs from notes and client. Provide resources as needed.

## 2022-09-22 ENCOUNTER — Other Ambulatory Visit: Payer: Self-pay

## 2022-09-28 ENCOUNTER — Other Ambulatory Visit: Payer: Self-pay

## 2022-09-29 NOTE — Telephone Encounter (Signed)
I called patient today to introduce Dylan Snyder and to assess patients' mental health needs. Patient stated that he didn't need any counseling or resources.

## 2022-10-02 ENCOUNTER — Other Ambulatory Visit: Payer: Self-pay

## 2022-10-05 ENCOUNTER — Telehealth: Payer: Self-pay | Admitting: Emergency Medicine

## 2022-10-05 NOTE — Telephone Encounter (Signed)
Copied from [place] [phone]. Topic: General - Other >> Oct 05, 2022 11:48 AM Oley Balm A wrote: Reason for CRM: Pt wife is calling back to check on the referral for GASTROENTEROLOGY for the pt.  Pt wife states if there is no one else close she will take pt to [facility]. Please call pt wife back.

## 2022-10-05 NOTE — Telephone Encounter (Signed)
(  I called patient) I see where [facility] sent referral for GI on 09/14/22 looks like they left vm for rescheduling but I'm not sure which office

## 2022-10-06 ENCOUNTER — Other Ambulatory Visit: Payer: Self-pay

## 2022-10-09 ENCOUNTER — Ambulatory Visit
Admission: RE | Admit: 2022-10-09 | Discharge: 2022-10-09 | Disposition: A | Discharge status: Home / Self Care | Payer: Medicaid Other | Source: Ambulatory Visit | Attending: Critical Care Medicine | Admitting: Critical Care Medicine

## 2022-10-09 DIAGNOSIS — K7031 Alcoholic cirrhosis of liver with ascites: Secondary | ICD-10-CM

## 2022-10-10 NOTE — Progress Notes (Signed)
Pt aware of results told to follow low fat diet and stop alcohol

## 2022-10-11 ENCOUNTER — Other Ambulatory Visit: Payer: Self-pay

## 2022-10-11 NOTE — Telephone Encounter (Signed)
Called patient and he is aware, information sent through [facility]

## 2022-10-12 ENCOUNTER — Other Ambulatory Visit: Payer: Self-pay

## 2022-10-12 ENCOUNTER — Telehealth: Payer: Self-pay | Admitting: Gastroenterology

## 2022-10-12 NOTE — Telephone Encounter (Signed)
Good afternoon Dr. Candis Schatz   Supervising MD for today 3/14 PM   Patients wife called stating that patient had a referral from patients PCP Dr. Joya Gaskins to be seen for Alcoholic cirrhosis of liver with ascites. After looking at patients chart patient does have previous gastro hisotry with [facility] in 2021 which include a colonoscopy. Patients records from colonoscopy and pathology report are in [place]. Will you please review and advise on scheduling?  Thank you.

## 2022-10-13 ENCOUNTER — Ambulatory Visit
Admission: RE | Admit: 2022-10-13 | Discharge: 2022-10-13 | Disposition: A | Discharge status: Home / Self Care | Payer: Medicaid Other | Source: Ambulatory Visit | Attending: Critical Care Medicine | Admitting: Critical Care Medicine

## 2022-10-13 ENCOUNTER — Other Ambulatory Visit: Payer: Self-pay

## 2022-10-13 DIAGNOSIS — Z122 Encounter for screening for malignant neoplasm of respiratory organs: Secondary | ICD-10-CM

## 2022-10-17 ENCOUNTER — Encounter: Payer: Self-pay | Admitting: Critical Care Medicine

## 2022-10-17 DIAGNOSIS — J438 Other emphysema: Secondary | ICD-10-CM | POA: Insufficient documentation

## 2022-10-17 DIAGNOSIS — I700 Atherosclerosis of aorta: Secondary | ICD-10-CM | POA: Insufficient documentation
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
## 2022-10-17 NOTE — Telephone Encounter (Signed)
Good morning Dr. Candis Schatz, we received a call from this patient's wife, requesting a follow up on this transfer of care request. Will you please review and advise on scheduling?   Thank you.

## 2022-10-17 NOTE — Progress Notes (Signed)
Patient awar of results

## 2022-10-18 ENCOUNTER — Other Ambulatory Visit: Payer: Self-pay

## 2022-10-18 ENCOUNTER — Telehealth: Payer: Self-pay | Admitting: Critical Care Medicine

## 2022-10-18 ENCOUNTER — Encounter: Payer: Self-pay | Admitting: Critical Care Medicine

## 2022-10-18 ENCOUNTER — Ambulatory Visit: Payer: Medicaid Other | Attending: Critical Care Medicine | Admitting: Critical Care Medicine

## 2022-10-18 VITALS — BP 90/60 | HR 102 | Temp 97.9°F | Ht 74.0 in | Wt 279.0 lb

## 2022-10-18 DIAGNOSIS — F17210 Nicotine dependence, cigarettes, uncomplicated: Secondary | ICD-10-CM | POA: Insufficient documentation

## 2022-10-18 DIAGNOSIS — F1090 Alcohol use, unspecified, uncomplicated: Secondary | ICD-10-CM

## 2022-10-18 DIAGNOSIS — M25519 Pain in unspecified shoulder: Secondary | ICD-10-CM | POA: Diagnosis not present

## 2022-10-18 DIAGNOSIS — Z1211 Encounter for screening for malignant neoplasm of colon: Secondary | ICD-10-CM | POA: Diagnosis not present

## 2022-10-18 DIAGNOSIS — J438 Other emphysema: Secondary | ICD-10-CM | POA: Diagnosis not present

## 2022-10-18 DIAGNOSIS — E1165 Type 2 diabetes mellitus with hyperglycemia: Secondary | ICD-10-CM

## 2022-10-18 DIAGNOSIS — Z7984 Long term (current) use of oral hypoglycemic drugs: Secondary | ICD-10-CM | POA: Diagnosis not present

## 2022-10-18 DIAGNOSIS — F1096 Alcohol use, unspecified with alcohol-induced persisting amnestic disorder: Secondary | ICD-10-CM

## 2022-10-18 DIAGNOSIS — E785 Hyperlipidemia, unspecified: Secondary | ICD-10-CM | POA: Diagnosis not present

## 2022-10-18 DIAGNOSIS — I10 Essential (primary) hypertension: Secondary | ICD-10-CM | POA: Diagnosis not present

## 2022-10-18 DIAGNOSIS — Z7901 Long term (current) use of anticoagulants: Secondary | ICD-10-CM | POA: Insufficient documentation

## 2022-10-18 DIAGNOSIS — D649 Anemia, unspecified: Secondary | ICD-10-CM | POA: Diagnosis not present

## 2022-10-18 DIAGNOSIS — Z789 Other specified health status: Secondary | ICD-10-CM

## 2022-10-18 DIAGNOSIS — G629 Polyneuropathy, unspecified: Secondary | ICD-10-CM | POA: Diagnosis not present

## 2022-10-18 DIAGNOSIS — Z760 Encounter for issue of repeat prescription: Secondary | ICD-10-CM | POA: Diagnosis not present

## 2022-10-18 DIAGNOSIS — F04 Amnestic disorder due to known physiological condition: Secondary | ICD-10-CM | POA: Insufficient documentation

## 2022-10-18 DIAGNOSIS — I82501 Chronic embolism and thrombosis of unspecified deep veins of right lower extremity: Secondary | ICD-10-CM | POA: Diagnosis not present

## 2022-10-18 DIAGNOSIS — Z933 Colostomy status: Secondary | ICD-10-CM | POA: Insufficient documentation

## 2022-10-18 DIAGNOSIS — S30811A Abrasion of abdominal wall, initial encounter: Secondary | ICD-10-CM | POA: Diagnosis not present

## 2022-10-18 DIAGNOSIS — I700 Atherosclerosis of aorta: Secondary | ICD-10-CM | POA: Diagnosis not present

## 2022-10-18 DIAGNOSIS — K7031 Alcoholic cirrhosis of liver with ascites: Secondary | ICD-10-CM

## 2022-10-18 DIAGNOSIS — Z79899 Other long term (current) drug therapy: Secondary | ICD-10-CM | POA: Insufficient documentation

## 2022-10-18 DIAGNOSIS — F17200 Nicotine dependence, unspecified, uncomplicated: Secondary | ICD-10-CM

## 2022-10-18 DIAGNOSIS — Z122 Encounter for screening for malignant neoplasm of respiratory organs: Secondary | ICD-10-CM

## 2022-10-18 MED ORDER — VALSARTAN 160 MG PO TABS
320.0000 mg | ORAL_TABLET | Freq: Every day | ORAL | 2 refills | Status: DC
  Filled 2022-10-18: qty 90, 45d supply, fill #0

## 2022-10-18 MED ORDER — TRELEGY ELLIPTA 100-62.5-25 MCG/ACT IN AEPB
1.0000 | INHALATION_SPRAY | Freq: Every day | RESPIRATORY_TRACT | 11 refills | Status: DC
  Filled 2022-10-18: qty 60, 30d supply, fill #0

## 2022-10-18 NOTE — Assessment & Plan Note (Signed)
Continue Zetia. °

## 2022-10-18 NOTE — Assessment & Plan Note (Signed)
Patient will need detox and outpatient rehab have referred him to our social worker [facility] behavioral health did not offer resources

## 2022-10-18 NOTE — Assessment & Plan Note (Signed)
Stable at this time continue thiamine

## 2022-10-18 NOTE — Assessment & Plan Note (Signed)
Have begun Zetia

## 2022-10-18 NOTE — Assessment & Plan Note (Signed)
Continue current medication refer back to gastroenterology

## 2022-10-18 NOTE — Patient Instructions (Addendum)
Ref to [employer] will be made  Reduce valsartan to 1/2 320mg  daily, refill is 160mg  daily one pill  Ref back to gastroenterology Dr Candis Schatz : colon screen for cancer and liver assessment  Start Trelegy one puff daily, put lips on inhaler and inhale rapidly  [email or web address] (website) or [phone] is the information for alcoholics anonymous  Both are free and immediately available for help with alcohol   Return Dr Joya Gaskins two months

## 2022-10-18 NOTE — Assessment & Plan Note (Signed)
Not committed to stopping smoking at this time    Current smoking consumption amount: 1 pack a day  Dicsussion on advise to quit smoking and smoking impacts: Lung impacts  Patient's willingness to quit: Not willing to quit  Methods to quit smoking discussed: Not discussed  Medication management of smoking session drugs discussed: Failed Resources provided:  AVS   Setting quit date not established  Follow-up arranged 2 months  Time spent counseling the patient: 5 minutes  

## 2022-10-18 NOTE — Assessment & Plan Note (Signed)
Start trelegy

## 2022-10-18 NOTE — Assessment & Plan Note (Signed)
Blood pressure actually at too low a level will reduce valsartan and 160 mg daily continue all other medication

## 2022-10-18 NOTE — Assessment & Plan Note (Signed)
Patient cannot tolerate metformin patient is on the glipizide we will maintain same

## 2022-10-18 NOTE — Telephone Encounter (Signed)
See for alcohol abuse, needs detox and outpatient rehab

## 2022-10-18 NOTE — Assessment & Plan Note (Signed)
No lung cancer seen!

## 2022-10-19 ENCOUNTER — Telehealth: Payer: Self-pay | Admitting: Critical Care Medicine

## 2022-10-19 ENCOUNTER — Other Ambulatory Visit: Payer: Self-pay

## 2022-10-19 MED ORDER — MOMETASONE FURO-FORMOTEROL FUM 100-5 MCG/ACT IN AERO
2.0000 | INHALATION_SPRAY | Freq: Two times a day (BID) | RESPIRATORY_TRACT | 6 refills | Status: DC
  Filled 2022-10-19: qty 13, 30d supply, fill #0
  Filled 2022-12-03: qty 13, 30d supply, fill #1

## 2022-10-19 NOTE — Telephone Encounter (Signed)
Ok not surprised Will Rx [employer]

## 2022-10-19 NOTE — Telephone Encounter (Signed)
-----   Message from Rickey Barbara, [place] sent at 10/18/2022  3:04 PM EDT ----- This patient's medicaid insurance requires trial and failure of 2 preferred meds prior to Trelegy approval. I don't see a history of Advair, Dulera or Symbicort? Can you changed to what is covered by insurance?

## 2022-10-20 ENCOUNTER — Ambulatory Visit: Payer: Self-pay | Admitting: *Deleted

## 2022-10-20 NOTE — Telephone Encounter (Signed)
Chief Complaint: shortness of breath with exertion , dizziness rash to legs Symptoms: shortness of breath with any exertion even going to the bathroom per patient's wife , on DPR. Dizziness, balance is off, rash to legs, tiny dots feet purple and painful.  Frequency: rash today  Pertinent Negatives: Patient denies per patient's wife no chest pain not with patient now.  Disposition: [x] ED /[] Urgent Care (no appt availability in office) / [] Appointment(In office/virtual)/ []  [place] [facility]/ [] Home Care/ [] Refused Recommended Disposition /[] [facility]/ []  Follow-up with PCP Additional Notes:   Instructed patient's wife to take patient to ED now for evaluation.     Reason for Disposition  Patient sounds very sick or weak to the triager  Answer Assessment - Initial Assessment Questions 1. RESPIRATORY STATUS: "Describe your breathing?" (e.g., wheezing, shortness of breath, unable to speak, severe coughing)      Shortness of breath with any exertion dizziness per patient's wife  2. ONSET: "When did this breathing problem begin?"      A few weeks but becoming worse  3. PATTERN "Does the difficult breathing come and go, or has it been constant since it started?"      Comes and goes with exertion 4. SEVERITY: "How bad is your breathing?" (e.g., mild, moderate, severe)    - MILD: No SOB at rest, mild SOB with walking, speaks normally in sentences, can lie down, no retractions, pulse < 100.    - MODERATE: SOB at rest, SOB with minimal exertion and prefers to sit, cannot lie down flat, speaks in phrases, mild retractions, audible wheezing, pulse 100-120.    - SEVERE: Very SOB at rest, speaks in single words, struggling to breathe, sitting hunched forward, retractions, pulse > 120      SOB with exertion 5. RECURRENT SYMPTOM: "Have you had difficulty breathing before?" If Yes, ask: "When was the last time?" and "What happened that time?"      Yes  6. CARDIAC HISTORY: "Do you have  any history of heart disease?" (e.g., heart attack, angina, bypass surgery, angioplasty)      See hx 7. LUNG HISTORY: "Do you have any history of lung disease?"  (e.g., pulmonary embolus, asthma, emphysema)     na 8. CAUSE: "What do you think is causing the breathing problem?"      Not sure  9. OTHER SYMPTOMS: "Do you have any other symptoms? (e.g., dizziness, runny nose, cough, chest pain, fever)     Dizziness with moving around off balance  10. O2 SATURATION MONITOR:  "Do you use an oxygen saturation monitor (pulse oximeter) at home?" If Yes, ask: "What is your reading (oxygen level) today?" "What is your usual oxygen saturation reading?" (e.g., 95%)       na 11. PREGNANCY: "Is there any chance you are pregnant?" "When was your last menstrual period?"       na 12. TRAVEL: "Have you traveled out of the country in the last month?" (e.g., travel history, exposures)       na  Answer Assessment - Initial Assessment Questions 1. APPEARANCE of RASH: "Describe the rash."      Small tiny dots feet purple and painful  2. LOCATION: "Where is the rash located?"      Bilateral legs  3. NUMBER: "How many spots are there?"      na 4. SIZE: "How big are the spots?" (Inches, centimeters or compare to size of a coin)      Tiny  5. ONSET: "When  did the rash start?"      Noted today  6. ITCHING: "Does the rash itch?" If Yes, ask: "How bad is the itch?"  (Scale 0-10; or none, mild, moderate, severe)     Na  7. PAIN: "Does the rash hurt?" If Yes, ask: "How bad is the pain?"  (Scale 0-10; or none, mild, moderate, severe)    - NONE (0): no pain    - MILD (1-3): doesn't interfere with normal activities     - MODERATE (4-7): interferes with normal activities or awakens from sleep     - SEVERE (8-10): excruciating pain, unable to do any normal activities     Yes  8. OTHER SYMPTOMS: "Do you have any other symptoms?" (e.g., fever)     Shortness of breath dizziness 9. PREGNANCY: "Is there any chance you are  pregnant?" "When was your last menstrual period?"     na  Protocols used: Breathing Difficulty-A-AH, Rash or Redness - Localized-A-AH

## 2022-10-20 NOTE — Telephone Encounter (Signed)
Noted  

## 2022-10-21 ENCOUNTER — Emergency Department (HOSPITAL_COMMUNITY): Payer: Medicaid Other

## 2022-10-21 ENCOUNTER — Other Ambulatory Visit: Payer: Self-pay

## 2022-10-21 ENCOUNTER — Inpatient Hospital Stay (HOSPITAL_COMMUNITY)
Admission: EM | Admit: 2022-10-21 | Discharge: 2022-10-23 | DRG: 897 | Disposition: A | Discharge status: Home / Self Care | Payer: Medicaid Other | Attending: Internal Medicine | Admitting: Internal Medicine

## 2022-10-21 DIAGNOSIS — N179 Acute kidney failure, unspecified: Secondary | ICD-10-CM | POA: Diagnosis not present

## 2022-10-21 DIAGNOSIS — I82511 Chronic embolism and thrombosis of right femoral vein: Secondary | ICD-10-CM | POA: Diagnosis present

## 2022-10-21 DIAGNOSIS — L02415 Cutaneous abscess of right lower limb: Secondary | ICD-10-CM

## 2022-10-21 DIAGNOSIS — K7010 Alcoholic hepatitis without ascites: Secondary | ICD-10-CM | POA: Diagnosis present

## 2022-10-21 DIAGNOSIS — R0602 Shortness of breath: Secondary | ICD-10-CM

## 2022-10-21 DIAGNOSIS — F1096 Alcohol use, unspecified with alcohol-induced persisting amnestic disorder: Secondary | ICD-10-CM

## 2022-10-21 DIAGNOSIS — R7989 Other specified abnormal findings of blood chemistry: Secondary | ICD-10-CM | POA: Diagnosis present

## 2022-10-21 DIAGNOSIS — K7031 Alcoholic cirrhosis of liver with ascites: Secondary | ICD-10-CM | POA: Diagnosis present

## 2022-10-21 DIAGNOSIS — E861 Hypovolemia: Secondary | ICD-10-CM | POA: Diagnosis present

## 2022-10-21 DIAGNOSIS — Z7951 Long term (current) use of inhaled steroids: Secondary | ICD-10-CM

## 2022-10-21 DIAGNOSIS — Z86718 Personal history of other venous thrombosis and embolism: Secondary | ICD-10-CM

## 2022-10-21 DIAGNOSIS — E860 Dehydration: Secondary | ICD-10-CM | POA: Diagnosis present

## 2022-10-21 DIAGNOSIS — T148XXA Other injury of unspecified body region, initial encounter: Secondary | ICD-10-CM

## 2022-10-21 DIAGNOSIS — Z6835 Body mass index (BMI) 35.0-35.9, adult: Secondary | ICD-10-CM

## 2022-10-21 DIAGNOSIS — L538 Other specified erythematous conditions: Secondary | ICD-10-CM | POA: Diagnosis not present

## 2022-10-21 DIAGNOSIS — Z79899 Other long term (current) drug therapy: Secondary | ICD-10-CM

## 2022-10-21 DIAGNOSIS — F17210 Nicotine dependence, cigarettes, uncomplicated: Secondary | ICD-10-CM | POA: Diagnosis present

## 2022-10-21 DIAGNOSIS — D692 Other nonthrombocytopenic purpura: Secondary | ICD-10-CM | POA: Diagnosis present

## 2022-10-21 DIAGNOSIS — E785 Hyperlipidemia, unspecified: Secondary | ICD-10-CM | POA: Diagnosis present

## 2022-10-21 DIAGNOSIS — Z789 Other specified health status: Secondary | ICD-10-CM

## 2022-10-21 DIAGNOSIS — E871 Hypo-osmolality and hyponatremia: Secondary | ICD-10-CM | POA: Diagnosis present

## 2022-10-21 DIAGNOSIS — E669 Obesity, unspecified: Secondary | ICD-10-CM | POA: Diagnosis present

## 2022-10-21 DIAGNOSIS — I10 Essential (primary) hypertension: Secondary | ICD-10-CM | POA: Diagnosis present

## 2022-10-21 DIAGNOSIS — F1026 Alcohol dependence with alcohol-induced persisting amnestic disorder: Principal | ICD-10-CM | POA: Diagnosis present

## 2022-10-21 DIAGNOSIS — Z7984 Long term (current) use of oral hypoglycemic drugs: Secondary | ICD-10-CM

## 2022-10-21 DIAGNOSIS — E1165 Type 2 diabetes mellitus with hyperglycemia: Secondary | ICD-10-CM | POA: Diagnosis present

## 2022-10-21 DIAGNOSIS — I82501 Chronic embolism and thrombosis of unspecified deep veins of right lower extremity: Secondary | ICD-10-CM | POA: Diagnosis present

## 2022-10-21 DIAGNOSIS — I776 Arteritis, unspecified: Secondary | ICD-10-CM | POA: Diagnosis not present

## 2022-10-21 DIAGNOSIS — R21 Rash and other nonspecific skin eruption: Secondary | ICD-10-CM | POA: Diagnosis present

## 2022-10-21 DIAGNOSIS — Z7901 Long term (current) use of anticoagulants: Secondary | ICD-10-CM

## 2022-10-21 DIAGNOSIS — R262 Difficulty in walking, not elsewhere classified: Secondary | ICD-10-CM | POA: Diagnosis present

## 2022-10-21 DIAGNOSIS — K709 Alcoholic liver disease, unspecified: Secondary | ICD-10-CM | POA: Diagnosis present

## 2022-10-21 HISTORY — DX: Acute kidney failure, unspecified: N17.9

## 2022-10-21 LAB — CBC WITH DIFFERENTIAL/PLATELET
Abs Immature Granulocytes: 0.11 10*3/uL — ABNORMAL HIGH (ref 0.00–0.07)
Basophils Absolute: 0.1 10*3/uL (ref 0.0–0.1)
Basophils Relative: 1 %
Eosinophils Absolute: 0.3 10*3/uL (ref 0.0–0.5)
Eosinophils Relative: 3 %
HCT: 37.4 % — ABNORMAL LOW (ref 39.0–52.0)
Hemoglobin: 13 g/dL (ref 13.0–17.0)
Immature Granulocytes: 1 %
Lymphocytes Relative: 6 %
Lymphs Abs: 0.6 10*3/uL — ABNORMAL LOW (ref 0.7–4.0)
MCH: 32.7 pg (ref 26.0–34.0)
MCHC: 34.8 g/dL (ref 30.0–36.0)
MCV: 94.2 fL (ref 80.0–100.0)
Monocytes Absolute: 1.2 10*3/uL — ABNORMAL HIGH (ref 0.1–1.0)
Monocytes Relative: 11 %
Neutro Abs: 7.9 10*3/uL — ABNORMAL HIGH (ref 1.7–7.7)
Neutrophils Relative %: 78 %
Platelets: 289 10*3/uL (ref 150–400)
RBC: 3.97 MIL/uL — ABNORMAL LOW (ref 4.22–5.81)
RDW: 12.3 % (ref 11.5–15.5)
WBC: 10.1 10*3/uL (ref 4.0–10.5)
nRBC: 0 % (ref 0.0–0.2)

## 2022-10-21 LAB — COMPREHENSIVE METABOLIC PANEL
ALT: 29 U/L (ref 0–44)
AST: 26 U/L (ref 15–41)
Albumin: 3.5 g/dL (ref 3.5–5.0)
Alkaline Phosphatase: 95 U/L (ref 38–126)
Anion gap: 12 (ref 5–15)
BUN: 30 mg/dL — ABNORMAL HIGH (ref 6–20)
CO2: 21 mmol/L — ABNORMAL LOW (ref 22–32)
Calcium: 8.8 mg/dL — ABNORMAL LOW (ref 8.9–10.3)
Chloride: 91 mmol/L — ABNORMAL LOW (ref 98–111)
Creatinine, Ser: 1.52 mg/dL — ABNORMAL HIGH (ref 0.61–1.24)
GFR, Estimated: 55 mL/min — ABNORMAL LOW (ref >60)
Glucose, Bld: 103 mg/dL — ABNORMAL HIGH (ref 70–99)
Potassium: 4.7 mmol/L (ref 3.5–5.1)
Sodium: 124 mmol/L — ABNORMAL LOW (ref 135–145)
Total Bilirubin: 1.6 mg/dL — ABNORMAL HIGH (ref 0.3–1.2)
Total Protein: 7.6 g/dL (ref 6.5–8.1)

## 2022-10-21 LAB — URINALYSIS, ROUTINE W REFLEX MICROSCOPIC
Bilirubin Urine: NEGATIVE
Glucose, UA: NEGATIVE mg/dL
Hgb urine dipstick: NEGATIVE
Ketones, ur: NEGATIVE mg/dL
Leukocytes,Ua: NEGATIVE
Nitrite: NEGATIVE
Protein, ur: 100 mg/dL — AB
Specific Gravity, Urine: 1.015 (ref 1.005–1.030)
pH: 5 (ref 5.0–8.0)

## 2022-10-21 LAB — D-DIMER, QUANTITATIVE: D-Dimer, Quant: 2.4 ug/mL-FEU — ABNORMAL HIGH (ref 0.00–0.50)

## 2022-10-21 LAB — TROPONIN I (HIGH SENSITIVITY)
Troponin I (High Sensitivity): 39 ng/L — ABNORMAL HIGH (ref <18)
Troponin I (High Sensitivity): 41 ng/L — ABNORMAL HIGH (ref <18)

## 2022-10-21 LAB — BRAIN NATRIURETIC PEPTIDE: B Natriuretic Peptide: 398 pg/mL — ABNORMAL HIGH (ref 0.0–100.0)

## 2022-10-21 LAB — AMMONIA: Ammonia: 22 umol/L (ref 9–35)

## 2022-10-21 MED ORDER — FOLIC ACID 1 MG PO TABS: 1.0000 mg | ORAL_TABLET | Freq: Every day | ORAL | Status: DC

## 2022-10-21 MED ORDER — LORAZEPAM 1 MG PO TABS
1.0000 mg | ORAL_TABLET | ORAL | Status: DC | PRN
  Administered 2022-10-21 – 2022-10-22 (×4): 1 mg via ORAL
  Filled 2022-10-21 (×4): qty 1

## 2022-10-21 MED ORDER — EZETIMIBE 10 MG PO TABS
10.0000 mg | ORAL_TABLET | Freq: Every day | ORAL | Status: DC
  Administered 2022-10-22 – 2022-10-23 (×2): 10 mg via ORAL
  Filled 2022-10-21 (×2): qty 1

## 2022-10-21 MED ORDER — THIAMINE HCL 100 MG/ML IJ SOLN
500.0000 mg | Freq: Three times a day (TID) | INTRAVENOUS | Status: DC
  Administered 2022-10-22 (×3): 500 mg via INTRAVENOUS
  Filled 2022-10-21 (×6): qty 5

## 2022-10-21 MED ORDER — RIVAROXABAN 20 MG PO TABS
20.0000 mg | ORAL_TABLET | Freq: Every day | ORAL | Status: DC
  Administered 2022-10-22: 20 mg via ORAL
  Filled 2022-10-21: qty 1

## 2022-10-21 MED ORDER — LORAZEPAM 1 MG PO TABS: 1.0000 mg | ORAL_TABLET | ORAL | Status: DC | PRN

## 2022-10-21 MED ORDER — NICOTINE 21 MG/24HR TD PT24
21.0000 mg | MEDICATED_PATCH | Freq: Every day | TRANSDERMAL | Status: DC
  Administered 2022-10-22: 21 mg via TRANSDERMAL
  Filled 2022-10-21 (×2): qty 1

## 2022-10-21 MED ORDER — GABAPENTIN 400 MG PO CAPS
800.0000 mg | ORAL_CAPSULE | Freq: Three times a day (TID) | ORAL | Status: DC
  Administered 2022-10-21 – 2022-10-23 (×5): 800 mg via ORAL
  Filled 2022-10-21 (×5): qty 2

## 2022-10-21 MED ORDER — VITAMIN B-6 100 MG PO TABS
500.0000 mg | ORAL_TABLET | Freq: Every day | ORAL | Status: DC
  Administered 2022-10-22: 500 mg via ORAL
  Filled 2022-10-21 (×2): qty 5

## 2022-10-21 MED ORDER — FOLIC ACID 1 MG PO TABS
1.0000 mg | ORAL_TABLET | Freq: Every day | ORAL | Status: DC
  Administered 2022-10-21 – 2022-10-23 (×3): 1 mg via ORAL
  Filled 2022-10-21 (×3): qty 1

## 2022-10-21 MED ORDER — GADOBUTROL 1 MMOL/ML IV SOLN
10.0000 mL | Freq: Once | INTRAVENOUS | Status: AC | PRN
  Administered 2022-10-21: 10 mL via INTRAVENOUS

## 2022-10-21 MED ORDER — THIAMINE HCL 100 MG/ML IJ SOLN: 100.0000 mg | Freq: Every day | INTRAMUSCULAR | Status: DC

## 2022-10-21 MED ORDER — PANTOPRAZOLE SODIUM 40 MG PO TBEC
40.0000 mg | DELAYED_RELEASE_TABLET | Freq: Every day | ORAL | Status: DC
  Administered 2022-10-21 – 2022-10-23 (×3): 40 mg via ORAL
  Filled 2022-10-21 (×3): qty 1

## 2022-10-21 MED ORDER — SODIUM CHLORIDE 0.9 % IV SOLN: INTRAVENOUS | Status: AC

## 2022-10-21 MED ORDER — ORAL CARE MOUTH RINSE: 15.0000 mL | OROMUCOSAL | Status: DC | PRN

## 2022-10-21 MED ORDER — TRAMADOL HCL 50 MG PO TABS
50.0000 mg | ORAL_TABLET | Freq: Four times a day (QID) | ORAL | Status: DC | PRN
  Administered 2022-10-22: 50 mg via ORAL
  Filled 2022-10-21 (×2): qty 1

## 2022-10-21 MED ORDER — SODIUM CHLORIDE 0.9 % IV SOLN: Freq: Once | INTRAVENOUS | Status: AC

## 2022-10-21 MED ORDER — IBUPROFEN 200 MG PO TABS
400.0000 mg | ORAL_TABLET | Freq: Once | ORAL | Status: AC
  Administered 2022-10-21: 400 mg via ORAL
  Filled 2022-10-21: qty 2

## 2022-10-21 MED ORDER — ADULT MULTIVITAMIN W/MINERALS CH
1.0000 | ORAL_TABLET | Freq: Every day | ORAL | Status: DC
  Administered 2022-10-21 – 2022-10-23 (×3): 1 via ORAL
  Filled 2022-10-21 (×3): qty 1

## 2022-10-21 MED ORDER — THIAMINE HCL 100 MG/ML IJ SOLN
250.0000 mg | INTRAVENOUS | Status: DC
  Filled 2022-10-21: qty 2.5

## 2022-10-21 MED ORDER — THIAMINE MONONITRATE 100 MG PO TABS
100.0000 mg | ORAL_TABLET | Freq: Every day | ORAL | Status: DC
  Administered 2022-10-21: 100 mg via ORAL
  Filled 2022-10-21: qty 1

## 2022-10-21 MED ORDER — MUPIROCIN CALCIUM 2 % EX CREA
TOPICAL_CREAM | Freq: Two times a day (BID) | CUTANEOUS | Status: DC
  Filled 2022-10-21: qty 15

## 2022-10-21 NOTE — ED Notes (Signed)
Pt assisted with wheelchair to restroom for BM

## 2022-10-21 NOTE — Assessment & Plan Note (Signed)
-  continue Xarelto -doppler ultrasound today still shows chronic right LE DVT without much changes

## 2022-10-21 NOTE — Assessment & Plan Note (Addendum)
-  creatinine elevated to 1.52 from prior of 0.97. -Pre-renal from dehydration and heavy alcohol use -keep on continuous IV fluid overnight and follow

## 2022-10-21 NOTE — Assessment & Plan Note (Addendum)
-  recently referred to [place] by primary care but has not yet had appointment set up -abdomen does not appear distended on exam. Does not believe he warrants any paracentesis at the moment. -pt continues to drink daily and no commitment to discontinue -holding spirolactone overnight due to borderline BP
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
## 2022-10-21 NOTE — Assessment & Plan Note (Addendum)
-  chronically hyponatremia around 131-133. Presenting Na of 124.  -likely hypovolemic hyponatremia with ongoing heavy alcohol use  -keep on continuous IV fluid and follow

## 2022-10-21 NOTE — Progress Notes (Signed)
Lower extremity venous bilateral study completed.  Preliminary results relayed to [place], [place].   See CV Proc for preliminary results report.   Darlin Coco, RDMS, RVT

## 2022-10-21 NOTE — Assessment & Plan Note (Signed)
-  pt presents with focal mid-lumbar spine pain and decrease ability to ambulate with increase weakness -MRI L-spine negative for any spinal canal stenosis, abscess, discitis or any other acute progress. On exam also has 5/5 strength with resistance. -suspect leg weakness/gait disturbance less associated with his back but more to his continual heavy alcohol use in the setting of liver disease -will initiate treatment with high IV thiamine and follow clinically -have PT evaluation in the morning.

## 2022-10-21 NOTE — Assessment & Plan Note (Signed)
-  Controlled. Last A1C of 6.8 last month

## 2022-10-21 NOTE — ED Provider Notes (Signed)
Dylan Snyder EMERGENCY DEPARTMENT AT [facility] Provider Note   CSN: [id number] Arrival date & time: 10/21/22  1159     History  Chief Complaint  Patient presents with   Leg Swelling    Dylan Snyder is a 52 y.o. male, history of alcoholism, cirrhosis, who presents to the ED secondary to a painful rash and bilateral leg swelling, has been going on for the last day.  He states he took off his socks, and he noticed that the his legs were swollen and had a rash.  Denies any IV drug use, does endorse drinking 6-8 beers a day.  Also endorses some shortness of breath, no chest pain.  Shortness of breath is worse on exertion, also states that his doctor feels like he has ascites, and that is been getting worse.  Shortness of breath is worse when laying down as well.  Home Medications Prior to Admission medications   Medication Sig Start Date End Date Taking? Authorizing Provider  albuterol (VENTOLIN HFA) 108 (90 Base) MCG/ACT inhaler Inhale 2 puffs into the lungs once every 6 (six) hours as needed for wheezing or shortness of breath. 09/14/22   Elsie Stain, MD  cloNIDine (CATAPRES) 0.1 MG tablet Take 1 tablet (0.1 mg total) by mouth 2 (two) times daily. 09/14/22   Elsie Stain, MD  ezetimibe (ZETIA) 10 MG tablet Take 1 tablet (10 mg total) by mouth daily. 09/14/22   Elsie Stain, MD  folic acid (FOLVITE) 1 MG tablet Take 1 tablet (1 mg total) by mouth daily. 09/14/22   Elsie Stain, MD  gabapentin (NEURONTIN) 400 MG capsule Take 2 capsules (800 mg total) by mouth 3 (three) times daily. 09/14/22   Elsie Stain, MD  glipiZIDE (GLUCOTROL) 5 MG tablet Take 1 tablet (5 mg total) by mouth 2 (two) times daily before a meal. 09/15/22   Elsie Stain, MD  mometasone-formoterol (DULERA) 100-5 MCG/ACT AERO Inhale 2 puffs into the lungs 2 (two) times daily. 10/19/22   Elsie Stain, MD  pantoprazole (PROTONIX) 40 MG tablet Take 1 tablet (40 mg total) by mouth daily.  09/14/22   Elsie Stain, MD  pyridoxine (B-6) 500 MG tablet Take 1 tablet (500 mg total) by mouth daily. 09/14/22   Elsie Stain, MD  rivaroxaban (XARELTO) 20 MG TABS tablet Take 1 tablet (20 mg total) by mouth daily with supper. 09/14/22   Elsie Stain, MD  spironolactone (ALDACTONE) 25 MG tablet Take 1 tablet (25 mg total) by mouth daily. 09/14/22   Elsie Stain, MD  Thiamine HCl (VITAMIN B-1) 250 MG tablet Take 1 tablet (250 mg total) by mouth daily. 09/14/22   Elsie Stain, MD  valsartan (DIOVAN) 160 MG tablet Take 2 tablets (320 mg total) by mouth daily. 10/18/22   Elsie Stain, MD  amLODipine (NORVASC) 10 MG tablet Take 1 tablet (10 mg total) by mouth daily. 07/14/20 08/09/20  Elsie Stain, MD      Allergies    Patient has no known allergies.    Review of Systems   Review of Systems  Respiratory:  Positive for shortness of breath.   Cardiovascular:  Negative for chest pain.  Skin:  Positive for rash.    Physical Exam Updated Vital Signs BP (!) 97/56   Pulse 90   Temp (!) 97.5 F (36.4 C)   Resp 20   Ht 6\' 2"  (1.88 m)   Wt 126 kg  SpO2 96%   BMI 35.66 kg/m  Physical Exam Vitals and nursing note reviewed.  Constitutional:      General: He is not in acute distress.    Appearance: He is well-developed.  HENT:     Head: Normocephalic and atraumatic.  Eyes:     Conjunctiva/sclera: Conjunctivae normal.  Cardiovascular:     Rate and Rhythm: Normal rate and regular rhythm.     Heart sounds: No murmur heard. Pulmonary:     Effort: Pulmonary effort is normal. No respiratory distress.     Breath sounds: Normal breath sounds.  Abdominal:     Palpations: Abdomen is soft.     Tenderness: There is no abdominal tenderness.     Comments: Ascites  Musculoskeletal:        General: No swelling.     Cervical back: Neck supple.  Skin:    General: Skin is warm and dry.     Capillary Refill: Capillary refill takes less than 2 seconds.     Comments:  Erythematous purpura to bilateral lower extremities.  Tender to the touch.  Nonpitting edema.  Neurological:     Mental Status: He is alert.  Psychiatric:        Mood and Affect: Mood normal.     ED Results / Procedures / Treatments   Labs (all labs ordered are listed, but only abnormal results are displayed) Labs Reviewed  COMPREHENSIVE METABOLIC PANEL - Abnormal; Notable for the following components:      Result Value   Sodium 124 (*)    Chloride 91 (*)    CO2 21 (*)    Glucose, Bld 103 (*)    BUN 30 (*)    Creatinine, Ser 1.52 (*)    Calcium 8.8 (*)    Total Bilirubin 1.6 (*)    GFR, Estimated 55 (*)    All other components within normal limits  CBC WITH DIFFERENTIAL/PLATELET - Abnormal; Notable for the following components:   RBC 3.97 (*)    HCT 37.4 (*)    Neutro Abs 7.9 (*)    Lymphs Abs 0.6 (*)    Monocytes Absolute 1.2 (*)    Abs Immature Granulocytes 0.11 (*)    All other components within normal limits  URINALYSIS, ROUTINE W REFLEX MICROSCOPIC - Abnormal; Notable for the following components:   Color, Urine AMBER (*)    APPearance HAZY (*)    Protein, ur 100 (*)    Bacteria, UA RARE (*)    All other components within normal limits  D-DIMER, QUANTITATIVE - Abnormal; Notable for the following components:   D-Dimer, Quant 2.40 (*)    All other components within normal limits  BRAIN NATRIURETIC PEPTIDE - Abnormal; Notable for the following components:   B Natriuretic Peptide 398.0 (*)    All other components within normal limits  TROPONIN I (HIGH SENSITIVITY) - Abnormal; Notable for the following components:   Troponin I (High Sensitivity) 39 (*)    All other components within normal limits  TROPONIN I (HIGH SENSITIVITY) - Abnormal; Notable for the following components:   Troponin I (High Sensitivity) 41 (*)    All other components within normal limits  AMMONIA    EKG None  Radiology VAS [place] LOWER EXTREMITY VENOUS (DVT)  Result Date: 10/21/2022  Lower  Venous DVT Study Patient Name:  Dylan Snyder  Date of Exam:   10/21/2022 Medical Rec #: [id number]       Accession #:    [id number] Date of Birth:  Nov 08, 1970       Patient Gender: M Patient Age:   30 years Exam Location:  [facility] Procedure:      VAS [place] LOWER EXTREMITY VENOUS (DVT) Referring Phys: Jerene Pitch Merriam Brandner --------------------------------------------------------------------------------  Indications: Bilateral skin changes/erythema distal calves. Other Indications: History of right lower extremity DVT 06-05-2019. Comparison Study: Multiple priors. Most recent 12-16-2020 right lower extremity                   venous showed chronic DVT. Performing Technologist: Dylan Snyder RDMS, RVT  Examination Guidelines: A complete evaluation includes B-mode imaging, spectral Doppler, color Doppler, and power Doppler as needed of all accessible portions of each vessel. Bilateral testing is considered an integral part of a complete examination. Limited examinations for reoccurring indications may be performed as noted. The reflux portion of the exam is performed with the patient in reverse Trendelenburg.  +---------+---------------+---------+-----------+----------+--------------+ RIGHT    CompressibilityPhasicitySpontaneityPropertiesThrombus Aging +---------+---------------+---------+-----------+----------+--------------+ CFV      Full           Yes      Yes                                 +---------+---------------+---------+-----------+----------+--------------+ SFJ      Full                                                        +---------+---------------+---------+-----------+----------+--------------+ FV Prox  Partial        Yes      Yes                  Chronic        +---------+---------------+---------+-----------+----------+--------------+ FV Mid   Partial                                      Chronic         +---------+---------------+---------+-----------+----------+--------------+ FV DistalPartial                                      Chronic        +---------+---------------+---------+-----------+----------+--------------+ PFV      Full                                                        +---------+---------------+---------+-----------+----------+--------------+ POP      Partial        Yes      Yes                                 +---------+---------------+---------+-----------+----------+--------------+ PTV      Full                                                        +---------+---------------+---------+-----------+----------+--------------+  PERO     Full                                                        +---------+---------------+---------+-----------+----------+--------------+   +---------+---------------+---------+-----------+----------+--------------+ LEFT     CompressibilityPhasicitySpontaneityPropertiesThrombus Aging +---------+---------------+---------+-----------+----------+--------------+ CFV      Full           Yes      Yes                                 +---------+---------------+---------+-----------+----------+--------------+ SFJ      Full                                                        +---------+---------------+---------+-----------+----------+--------------+ FV Prox  Full                                                        +---------+---------------+---------+-----------+----------+--------------+ FV Mid   Full                                                        +---------+---------------+---------+-----------+----------+--------------+ FV DistalFull                                                        +---------+---------------+---------+-----------+----------+--------------+ PFV      Full                                                         +---------+---------------+---------+-----------+----------+--------------+ POP      Full           Yes      Yes                                 +---------+---------------+---------+-----------+----------+--------------+ PTV      Full                                                        +---------+---------------+---------+-----------+----------+--------------+ PERO     Full                                                        +---------+---------------+---------+-----------+----------+--------------+  Summary: RIGHT: - Findings consistent with chronic deep vein thrombosis involving the right femoral vein. - Findings appear essentially unchanged compared to previous examination. - No cystic structure found in the popliteal fossa. - Ultrasound characteristics of enlarged lymph nodes are noted in the groin.  LEFT: - There is no evidence of deep vein thrombosis in the lower extremity.  - No cystic structure found in the popliteal fossa.  *See table(s) above for measurements and observations.    Preliminary    DG Chest 2 View  Result Date: 10/21/2022 CLINICAL DATA:  Shortness of breath and cough. EXAM: CHEST - 2 VIEW COMPARISON:  04/18/2020 FINDINGS: The lungs are clear without focal pneumonia, edema, pneumothorax or pleural effusion. The cardiopericardial silhouette is within normal limits for size. The visualized bony structures of the thorax are unremarkable. IMPRESSION: No active cardiopulmonary disease. Electronically Signed   By: Misty Stanley M.D.   On: 10/21/2022 13:31    Procedures Procedures    Medications Ordered in ED Medications  LORazepam (ATIVAN) tablet 1-4 mg (has no administration in time range)    Or  LORazepam (ATIVAN) tablet 1 mg (has no administration in time range)  thiamine (VITAMIN B1) tablet 100 mg (has no administration in time range)    Or  thiamine (VITAMIN B1) injection 100 mg (has no administration in time range)  folic acid (FOLVITE) tablet 1 mg  (has no administration in time range)  multivitamin with minerals tablet 1 tablet (has no administration in time range)  ibuprofen (ADVIL) tablet 400 mg (400 mg Oral Given 10/21/22 1544)    ED Course/ Medical Decision Making/ A&P                             Medical Decision Making Patient is a 52 year old male, history of alcohol use disorder, cirrhosis, who presents to the ED secondary to a painful rash to bilateral lower extremities.  He also endorses some shortness of breath, worse when walking.  Denies any IV drug use.  Denies any recent wounds.  Denies any chest pain.  Notes that his primary care doctor said that he may have some ascites.  Given his purpura, we will obtain CBC, CMP, urinalysis, and obtain troponins, dimer for bilateral lower extremity swelling, as well as a ultrasound of bilateral legs to evaluate for DVT.  He has been compliant with his Xarelto.  We also obtain a chest x-ray given his shortness of breath.  He does not have any wheezing but does endorse increased coughing.  Amount and/or Complexity of Data Reviewed Labs: ordered.    Details: Labs showed no evidence of thrombocytopenia, urine fairly unremarkable, mildly elevated Trop, elevated D-dimer. Radiology: ordered.    Details: Chest x-ray clear, Doppler show chronic DVT of the right leg. Discussion of management or test interpretation with external provider(s): Discussed with Dr. Johnney Killian, attending, she recommends an MRI back with and without to rule out epidural abscess as he is having pain from his back rating to his leg and some weakness.  He has not had any loss of bowel or bladder, however he did defecate on himself as he was unable to reach the bathroom in time. She recommends holding on CTA chest at this time as pt is compliant with xarelto and is not hypoxic or tachycardic.  Of note he is very distended, and shortness of breath is attributed may be due to his ascites.  She will follow-up on MRI of lumbar spine  and  admit patient. Greatly appreciate her help.   Risk OTC drugs. Prescription drug management.    Final Clinical Impression(s) / ED Diagnoses Final diagnoses:  AKI (acute kidney injury) ([place])  Vasculitis ([place])  Shortness of breath    Rx / DC Orders ED Discharge Orders     None         Ulices Maack, Si Gaul, PA 10/21/22 Alonza Bogus, MD 10/21/22 2032

## 2022-10-21 NOTE — Assessment & Plan Note (Signed)
-  Borderline BP in the 90-100s SBP.  -recently had valsartan decrease from 320 to 160mg  due to hypotension after initiation of spirolactone -hold valsartan, spirolactone and clonidine overnight while receiving fluid hydration

## 2022-10-21 NOTE — ED Notes (Signed)
Pt was taken to xray

## 2022-10-21 NOTE — Assessment & Plan Note (Signed)
-  presents with bilateral painful non-blanching palpable purpuric rash to bilateral distal lower extremity. Could be due to underlying liver disease -Hgb and platelet are within normal limits. No immediate concerns for any systemic coagulopathy -will continue conservative treatment with pain control and if persistent for weeks would recommend biopsy with dermatology outpatient -continue to follow daily CBC

## 2022-10-21 NOTE — Assessment & Plan Note (Addendum)
-  pt with persistent wound adjacent to right of the umbilical region -start mupirocin ointment  -wound care consulted

## 2022-10-21 NOTE — Assessment & Plan Note (Signed)
-  continue Zetia -not on statin due to liver disease

## 2022-10-21 NOTE — Assessment & Plan Note (Signed)
-  continual heavy alcohol use of up to 10 beers daily -place on CIWA protocol q1hr. No signs or symptoms of withdrawal at this time.

## 2022-10-21 NOTE — ED Triage Notes (Signed)
Pt reports bilateral lower leg swelling and pain x 2 days

## 2022-10-22 ENCOUNTER — Encounter (HOSPITAL_COMMUNITY): Payer: Self-pay | Admitting: Internal Medicine

## 2022-10-22 DIAGNOSIS — F1096 Alcohol use, unspecified with alcohol-induced persisting amnestic disorder: Secondary | ICD-10-CM | POA: Diagnosis not present

## 2022-10-22 DIAGNOSIS — K7031 Alcoholic cirrhosis of liver with ascites: Secondary | ICD-10-CM | POA: Diagnosis present

## 2022-10-22 DIAGNOSIS — I10 Essential (primary) hypertension: Secondary | ICD-10-CM | POA: Diagnosis present

## 2022-10-22 DIAGNOSIS — Z7951 Long term (current) use of inhaled steroids: Secondary | ICD-10-CM | POA: Diagnosis not present

## 2022-10-22 DIAGNOSIS — Z7984 Long term (current) use of oral hypoglycemic drugs: Secondary | ICD-10-CM | POA: Diagnosis not present

## 2022-10-22 DIAGNOSIS — F17210 Nicotine dependence, cigarettes, uncomplicated: Secondary | ICD-10-CM | POA: Diagnosis present

## 2022-10-22 DIAGNOSIS — E1165 Type 2 diabetes mellitus with hyperglycemia: Secondary | ICD-10-CM | POA: Diagnosis present

## 2022-10-22 DIAGNOSIS — E861 Hypovolemia: Secondary | ICD-10-CM | POA: Diagnosis present

## 2022-10-22 DIAGNOSIS — R21 Rash and other nonspecific skin eruption: Secondary | ICD-10-CM | POA: Diagnosis present

## 2022-10-22 DIAGNOSIS — E871 Hypo-osmolality and hyponatremia: Secondary | ICD-10-CM | POA: Diagnosis present

## 2022-10-22 DIAGNOSIS — Z79899 Other long term (current) drug therapy: Secondary | ICD-10-CM | POA: Diagnosis not present

## 2022-10-22 DIAGNOSIS — Z6835 Body mass index (BMI) 35.0-35.9, adult: Secondary | ICD-10-CM | POA: Diagnosis not present

## 2022-10-22 DIAGNOSIS — E669 Obesity, unspecified: Secondary | ICD-10-CM | POA: Diagnosis present

## 2022-10-22 DIAGNOSIS — D692 Other nonthrombocytopenic purpura: Secondary | ICD-10-CM | POA: Diagnosis present

## 2022-10-22 DIAGNOSIS — Z7901 Long term (current) use of anticoagulants: Secondary | ICD-10-CM | POA: Diagnosis not present

## 2022-10-22 DIAGNOSIS — R262 Difficulty in walking, not elsewhere classified: Secondary | ICD-10-CM | POA: Diagnosis present

## 2022-10-22 DIAGNOSIS — R0602 Shortness of breath: Secondary | ICD-10-CM | POA: Diagnosis not present

## 2022-10-22 DIAGNOSIS — I776 Arteritis, unspecified: Secondary | ICD-10-CM | POA: Diagnosis present

## 2022-10-22 DIAGNOSIS — K7010 Alcoholic hepatitis without ascites: Secondary | ICD-10-CM | POA: Diagnosis present

## 2022-10-22 DIAGNOSIS — E860 Dehydration: Secondary | ICD-10-CM | POA: Diagnosis present

## 2022-10-22 DIAGNOSIS — R7989 Other specified abnormal findings of blood chemistry: Secondary | ICD-10-CM | POA: Diagnosis present

## 2022-10-22 DIAGNOSIS — F1026 Alcohol dependence with alcohol-induced persisting amnestic disorder: Secondary | ICD-10-CM | POA: Diagnosis not present

## 2022-10-22 DIAGNOSIS — E785 Hyperlipidemia, unspecified: Secondary | ICD-10-CM | POA: Diagnosis present

## 2022-10-22 DIAGNOSIS — N179 Acute kidney failure, unspecified: Secondary | ICD-10-CM | POA: Diagnosis present

## 2022-10-22 DIAGNOSIS — I82511 Chronic embolism and thrombosis of right femoral vein: Secondary | ICD-10-CM | POA: Diagnosis present

## 2022-10-22 DIAGNOSIS — K709 Alcoholic liver disease, unspecified: Secondary | ICD-10-CM | POA: Diagnosis present

## 2022-10-22 LAB — BASIC METABOLIC PANEL
Anion gap: 12 (ref 5–15)
BUN: 33 mg/dL — ABNORMAL HIGH (ref 6–20)
CO2: 20 mmol/L — ABNORMAL LOW (ref 22–32)
Calcium: 9.2 mg/dL (ref 8.9–10.3)
Chloride: 95 mmol/L — ABNORMAL LOW (ref 98–111)
Creatinine, Ser: 1.48 mg/dL — ABNORMAL HIGH (ref 0.61–1.24)
GFR, Estimated: 57 mL/min — ABNORMAL LOW (ref >60)
Glucose, Bld: 119 mg/dL — ABNORMAL HIGH (ref 70–99)
Potassium: 3.8 mmol/L (ref 3.5–5.1)
Sodium: 127 mmol/L — ABNORMAL LOW (ref 135–145)

## 2022-10-22 LAB — CORTISOL: Cortisol, Plasma: 15.4 ug/dL

## 2022-10-22 LAB — URINALYSIS, ROUTINE W REFLEX MICROSCOPIC
Bacteria, UA: NONE SEEN
Bilirubin Urine: NEGATIVE
Glucose, UA: 50 mg/dL — AB
Ketones, ur: NEGATIVE mg/dL
Leukocytes,Ua: NEGATIVE
Nitrite: NEGATIVE
Protein, ur: NEGATIVE mg/dL
Specific Gravity, Urine: 1.015 (ref 1.005–1.030)
pH: 5 (ref 5.0–8.0)

## 2022-10-22 LAB — HEPATITIS PANEL, ACUTE
HCV Ab: NONREACTIVE
Hep A IgM: NONREACTIVE
Hep B C IgM: NONREACTIVE
Hepatitis B Surface Ag: NONREACTIVE

## 2022-10-22 LAB — CBC
HCT: 40.3 % (ref 39.0–52.0)
Hemoglobin: 14 g/dL (ref 13.0–17.0)
MCH: 32.9 pg (ref 26.0–34.0)
MCHC: 34.7 g/dL (ref 30.0–36.0)
MCV: 94.6 fL (ref 80.0–100.0)
Platelets: 299 10*3/uL (ref 150–400)
RBC: 4.26 MIL/uL (ref 4.22–5.81)
RDW: 12.4 % (ref 11.5–15.5)
WBC: 9.6 10*3/uL (ref 4.0–10.5)
nRBC: 0 % (ref 0.0–0.2)

## 2022-10-22 LAB — HIV ANTIBODY (ROUTINE TESTING W REFLEX): HIV Screen 4th Generation wRfx: NONREACTIVE

## 2022-10-22 LAB — SODIUM, URINE, RANDOM: Sodium, Ur: 96 mmol/L

## 2022-10-22 LAB — TSH: TSH: 2.946 u[IU]/mL (ref 0.350–4.500)

## 2022-10-22 MED ORDER — THIAMINE HCL 100 MG/ML IJ SOLN: 250.0000 mg | INTRAVENOUS | Status: DC
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
## 2022-10-22 NOTE — Progress Notes (Signed)
PROGRESS NOTE    Dylan Snyder  [id number] DOB: October 20, 1970 DOA: 10/21/2022 PCP: Elsie Stain, MD  Outpatient Specialists:     Brief Narrative:  Patient is a 52 year old male, obese, with past medical history significant for alcohol cirrhosis, Warnicke Korsakoff syndrome, chronic hyponatremia, chronic right lower extremity DVT, status post IVC filter placement, hypertension, and hyperlipidemia.  Patient continues to drink alcohol.  Patient was admitted with bilateral lower leg, nonblanching rash, acute kidney injury, hyponatremia-seems chronic, possible vasculitic lesions and continuous alcohol abuse with associated alcoholic liver cirrhosis..   Assessment & Plan:   Principal Problem:   Wernicke-Korsakoff syndrome (alcoholic) (HCC) Active Problems:   Alcohol use   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 (HCC)   Essential hypertension   Type 2 diabetes mellitus with hyperglycemia, without long-term current use of insulin (HCC)   Dyslipidemia   Alcoholic cirrhosis of liver with ascites (HCC)   Hyponatremia   AKI (acute kidney injury) ([place])   Vasculitis ([place])   Open wound of skin   Wernicke-Korsakoff syndrome (alcoholic) ([place]) -Likely due to alcohol use. -Continue thiamine and multivitamin.   -Manage expectantly.  Severe back pain:  -Prior to presentation, history of focal mid-lumbar spine pain and decrease ability to ambulate with increase weakness -MRI L-spine negative for any spinal canal stenosis, abscess, discitis or any other acute progress. On exam also has 5/5 strength with resistance. -PT evaluation .   Open wound of skin -pt with persistent wound adjacent to right of the umbilical region -start mupirocin ointment  -wound care consulted   Nonblanching rash lower legs/possible vasculitis (HCC) -presents with bilateral painful non-blanching palpable purpuric rash to bilateral distal lower extremity.  -Etiology uncertain.   -Check ANA, C3, C4.    -Could be due to underlying liver disease -For further workup and possible dermatology involvement if no significant improvement.   AKI (acute kidney injury) ([facility]) -creatinine elevated to 1.52 from prior of 0.97. -Slight improvement in serum creatinine noted this morning. -Etiology of AKI is uncertain-prerenal versus ATN versus combined prerenal and ATN. -Continue to monitor renal function. -Avoid nephrotoxins. -Optimize volume. -Keep MAP greater than 65 mmHg.   Hyponatremia -Hyponatremia is chronic. -Patient has history of alcohol use. -Check urine sodium, urine osmolality, serum osmolality, cortisol and TSH. -Repeat urinalysis.  Prior urinalysis revealed specific gravity of 1.015.   Alcoholic cirrhosis of liver with ascites ([place]) -recently referred to [facility] by primary care but has not yet had appointment set up -abdomen does not appear distended on exam. Does not believe he warrants any paracentesis at the moment. -pt continues to drink daily and no commitment to discontinue   Dyslipidemia -continue Zetia -not on statin due to liver disease   Type 2 diabetes mellitus with hyperglycemia, without long-term current use of insulin ([place]) -Controlled. Last A1C of 6.8 last month   Essential hypertension -Borderline BP in the 90-100s SBP.  This is likely secondary to advanced chronic liver disease. -Avoid hypotension.    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 (HCC) -continue Xarelto -doppler ultrasound today still shows chronic right LE DVT without much changes   Alcohol use -Heavy alcohol use of up to 10 beers daily -CIWA protocol q1hr.    DVT prophylaxis: Xarelto 20 Mg p.o. once daily. Code Status: Full code Family Communication:  Disposition Plan: Home eventually   Consultants:  None.  Procedures:  None.  Antimicrobials:  None.   Subjective: -Bilateral lower leg, nonblanching rash persists. -No fever or chills.  Objective: Vitals:    10/22/22 0453 10/22/22 0500 10/22/22 0600 10/22/22 0944  BP: (!) 90/49  (!) 90/59 107/70  Pulse: (!) 109  (!) 105 86  Resp: 16  18 (!) 21  Temp: 98.6 F (37 C)  99.3 F (37.4 C) 98.2 F (36.8 C)  TempSrc: Oral  Oral Oral  SpO2: 93% 99% 99% 97%  Weight:      Height:        Intake/Output Summary (Last 24 hours) at 10/22/2022 1353 Last data filed at 10/22/2022 1326 Gross per 24 hour  Intake 460 ml  Output 1500 ml  Net -1040 ml   Filed Weights   10/21/22 1207  Weight: 126 kg    Examination:  General exam: Appears calm and comfortable.  Patient is obese. Respiratory system: Clear to auscultation.  Cardiovascular system: S1 & S2 heard Gastrointestinal system: Abdomen is obese, soft and nontender with prior surgical scar.   Neuro: Awake and alert.  Patient moves all extremities. Extremities: Bilateral lower leg nonblanching rash..     Data Reviewed: I have personally reviewed following labs and imaging studies  CBC: Recent Labs  Lab 10/21/22 1407 10/22/22 0019  WBC 10.1 9.6  NEUTROABS 7.9*  --   HGB 13.0 14.0  HCT 37.4* 40.3  MCV 94.2 94.6  PLT 289 [id number]   Basic Metabolic Panel: Recent Labs  Lab 10/21/22 1407 10/22/22 0019  NA 124* 127*  K 4.7 3.8  CL 91* 95*  CO2 21* 20*  GLUCOSE 103* 119*  BUN 30* 33*  CREATININE 1.52* 1.48*  CALCIUM 8.8* 9.2   GFR: Estimated Creatinine Clearance: 84.2 mL/min (A) (by C-G formula based on SCr of 1.48 mg/dL (H)). Liver Function Tests: Recent Labs  Lab 10/21/22 1407  AST 26  ALT 29  ALKPHOS 95  BILITOT 1.6*  PROT 7.6  ALBUMIN 3.5   No results for input(s): "LIPASE", "AMYLASE" in the last 168 hours. Recent Labs  Lab 10/21/22 1552  AMMONIA 22   Coagulation Profile: No results for input(s): "INR", "PROTIME" in the last 168 hours. Cardiac Enzymes: No results for input(s): "CKTOTAL", "CKMB", "CKMBINDEX", "TROPONINI" in the last 168 hours. BNP (last 3 results) No results for input(s): "PROBNP" in the last 8760  hours. HbA1C: No results for input(s): "HGBA1C" in the last 72 hours. CBG: No results for input(s): "GLUCAP" in the last 168 hours. Lipid Profile: No results for input(s): "CHOL", "HDL", "LDLCALC", "TRIG", "CHOLHDL", "LDLDIRECT" in the last 72 hours. Thyroid Function Tests: No results for input(s): "TSH", "T4TOTAL", "FREET4", "T3FREE", "THYROIDAB" in the last 72 hours. Anemia Panel: No results for input(s): "VITAMINB12", "FOLATE", "FERRITIN", "TIBC", "IRON", "RETICCTPCT" in the last 72 hours. Urine analysis:    Component Value Date/Time   COLORURINE AMBER (A) 10/21/2022 1431   APPEARANCEUR HAZY (A) 10/21/2022 1431   LABSPEC 1.015 10/21/2022 1431   PHURINE 5.0 10/21/2022 1431   GLUCOSEU NEGATIVE 10/21/2022 1431   HGBUR NEGATIVE 10/21/2022 1431   BILIRUBINUR NEGATIVE 10/21/2022 1431   KETONESUR NEGATIVE 10/21/2022 1431   PROTEINUR 100 (A) 10/21/2022 1431   NITRITE NEGATIVE 10/21/2022 1431   LEUKOCYTESUR NEGATIVE 10/21/2022 1431   Sepsis Labs: @LABRCNTIP (procalcitonin:4,lacticidven:4)  )No results found for this or any previous visit (from the past 240 hour(s)).       Radiology Studies: MR Lumbar Spine W Wo Contrast  Result Date: 10/21/2022 CLINICAL DATA:  Weakness of bilateral legs, latter abscess EXAM: MRI LUMBAR SPINE WITHOUT AND WITH CONTRAST TECHNIQUE: Multiplanar and multiecho pulse sequences of the lumbar spine  were obtained without and with intravenous contrast. CONTRAST:  62mL GADAVIST GADOBUTROL 1 MMOL/ML IV SOLN COMPARISON:  03/21/2020 FINDINGS: Segmentation: 5 lumbar type vertebral bodies. The lowest fully formed disc space is labeled L5-S1. Alignment:  Mild levocurvature.  No significant listhesis. Vertebrae: No fracture, evidence of discitis, or bone lesion. No abnormal enhancement. Conus medullaris and cauda equina: Conus extends to the L1-L2 level. Conus and cauda equina appear normal. No abnormal enhancement. Paraspinal and other soft tissues: Negative. Disc  levels: T12-L1: No significant disc bulge. No spinal canal stenosis or neural foraminal narrowing. L1-L2: No significant disc bulge. No spinal canal stenosis or neural foraminal narrowing. L2-L3: Minimal disc bulge. No spinal canal stenosis or neural foraminal narrowing. L3-L4: Minimal disc bulge with right foraminal annular fissure. No spinal canal stenosis or neural foraminal narrowing. L4-L5: Minimal disc bulge. Mild facet arthropathy. No spinal canal stenosis or neural foraminal narrowing. L5-S1: Mild disc bulge with central protrusion with annular fissure. Mild facet arthropathy. No spinal canal stenosis or neural foraminal narrowing. IMPRESSION: 1. No spinal canal stenosis or neural foraminal narrowing. 2. No evidence of spinal abscess, discitis-osteomyelitis, or other acute process in the lumbar spine. 3. Annular fissures in the right foramen at L3-L4 and centrally at L5-S1, which can also be a cause of back pain. 4. Mild facet arthropathy at L4-L5 and L5-S1, which can also be a cause of back pain. Electronically Signed   By: Merilyn Baba M.D.   On: 10/21/2022 18:54   VAS [place] LOWER EXTREMITY VENOUS (DVT)  Result Date: 10/21/2022  Lower Venous DVT Study Patient Name:  JAGDISH SONN  Date of Exam:   10/21/2022 Medical Rec #: [id number]       Accession #:    [id number] Date of Birth: [birth year]       Patient Gender: M Patient Age:   53 years Exam Location:  [facility] Procedure:      VAS [place] LOWER EXTREMITY VENOUS (DVT) Referring Phys: Jerene Pitch SMALL --------------------------------------------------------------------------------  Indications: Bilateral skin changes/erythema distal calves. Other Indications: History of right lower extremity DVT 06-05-2019. Comparison Study: Multiple priors. Most recent 12-16-2020 right lower extremity                   venous showed chronic DVT. Performing Technologist: Darlin Coco RDMS, RVT  Examination Guidelines: A complete evaluation includes B-mode imaging,  spectral Doppler, color Doppler, and power Doppler as needed of all accessible portions of each vessel. Bilateral testing is considered an integral part of a complete examination. Limited examinations for reoccurring indications may be performed as noted. The reflux portion of the exam is performed with the patient in reverse Trendelenburg.  +---------+---------------+---------+-----------+----------+--------------+ RIGHT    CompressibilityPhasicitySpontaneityPropertiesThrombus Aging +---------+---------------+---------+-----------+----------+--------------+ CFV      Full           Yes      Yes                                 +---------+---------------+---------+-----------+----------+--------------+ SFJ      Full                                                        +---------+---------------+---------+-----------+----------+--------------+ FV Prox  Partial        Yes  Yes                  Chronic        +---------+---------------+---------+-----------+----------+--------------+ FV Mid   Partial                                      Chronic        +---------+---------------+---------+-----------+----------+--------------+ FV DistalPartial                                      Chronic        +---------+---------------+---------+-----------+----------+--------------+ PFV      Full                                                        +---------+---------------+---------+-----------+----------+--------------+ POP      Partial        Yes      Yes                                 +---------+---------------+---------+-----------+----------+--------------+ PTV      Full                                                        +---------+---------------+---------+-----------+----------+--------------+ PERO     Full                                                        +---------+---------------+---------+-----------+----------+--------------+    +---------+---------------+---------+-----------+----------+--------------+ LEFT     CompressibilityPhasicitySpontaneityPropertiesThrombus Aging +---------+---------------+---------+-----------+----------+--------------+ CFV      Full           Yes      Yes                                 +---------+---------------+---------+-----------+----------+--------------+ SFJ      Full                                                        +---------+---------------+---------+-----------+----------+--------------+ FV Prox  Full                                                        +---------+---------------+---------+-----------+----------+--------------+ FV Mid   Full                                                        +---------+---------------+---------+-----------+----------+--------------+  FV DistalFull                                                        +---------+---------------+---------+-----------+----------+--------------+ PFV      Full                                                        +---------+---------------+---------+-----------+----------+--------------+ POP      Full           Yes      Yes                                 +---------+---------------+---------+-----------+----------+--------------+ PTV      Full                                                        +---------+---------------+---------+-----------+----------+--------------+ PERO     Full                                                        +---------+---------------+---------+-----------+----------+--------------+    Summary: RIGHT: - Findings consistent with chronic deep vein thrombosis involving the right femoral vein. - Findings appear essentially unchanged compared to previous examination. - No cystic structure found in the popliteal fossa. - Ultrasound characteristics of enlarged lymph nodes are noted in the groin.  LEFT: - There is no evidence of deep  vein thrombosis in the lower extremity.  - No cystic structure found in the popliteal fossa.  *See table(s) above for measurements and observations.    Preliminary    DG Chest 2 View  Result Date: 10/21/2022 CLINICAL DATA:  Shortness of breath and cough. EXAM: CHEST - 2 VIEW COMPARISON:  04/18/2020 FINDINGS: The lungs are clear without focal pneumonia, edema, pneumothorax or pleural effusion. The cardiopericardial silhouette is within normal limits for size. The visualized bony structures of the thorax are unremarkable. IMPRESSION: No active cardiopulmonary disease. Electronically Signed   By: Misty Stanley M.D.   On: 10/21/2022 13:31        Scheduled Meds:  ezetimibe  10 mg Oral Daily   folic acid  1 mg Oral Daily   gabapentin  800 mg Oral TID   multivitamin with minerals  1 tablet Oral Daily   mupirocin cream   Topical BID   nicotine  21 mg Transdermal Daily   pantoprazole  40 mg Oral Daily   pyridoxine  500 mg Oral Daily   rivaroxaban  20 mg Oral Q supper   Continuous Infusions:  [START ON 10/23/2022] thiamine (VITAMIN B1) injection     thiamine (VITAMIN B1) injection 500 mg (10/22/22 1142)     LOS: 0 days    Time spent: 55 minutes.    Dana Allan, MD  [facility] Pager #: [phone]  [phone] 7PM-7AM contact night coverage as above
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
## 2022-10-22 NOTE — Progress Notes (Signed)
Patient has removed current IV by himself at this time. Unable to admin IV thiamine. Patient very adamant about speaking with the doctor and being discharged.

## 2022-10-23 ENCOUNTER — Other Ambulatory Visit: Payer: Self-pay

## 2022-10-23 DIAGNOSIS — F1096 Alcohol use, unspecified with alcohol-induced persisting amnestic disorder: Secondary | ICD-10-CM | POA: Diagnosis not present

## 2022-10-23 LAB — CBC WITH DIFFERENTIAL/PLATELET
Abs Immature Granulocytes: 0.1 10*3/uL — ABNORMAL HIGH (ref 0.00–0.07)
Basophils Absolute: 0.1 10*3/uL (ref 0.0–0.1)
Basophils Relative: 1 %
Eosinophils Absolute: 0.5 10*3/uL (ref 0.0–0.5)
Eosinophils Relative: 5 %
HCT: 36.8 % — ABNORMAL LOW (ref 39.0–52.0)
Hemoglobin: 12.6 g/dL — ABNORMAL LOW (ref 13.0–17.0)
Immature Granulocytes: 1 %
Lymphocytes Relative: 17 %
Lymphs Abs: 1.5 10*3/uL (ref 0.7–4.0)
MCH: 32.7 pg (ref 26.0–34.0)
MCHC: 34.2 g/dL (ref 30.0–36.0)
MCV: 95.6 fL (ref 80.0–100.0)
Monocytes Absolute: 1.2 10*3/uL — ABNORMAL HIGH (ref 0.1–1.0)
Monocytes Relative: 14 %
Neutro Abs: 5.4 10*3/uL (ref 1.7–7.7)
Neutrophils Relative %: 62 %
Platelets: 342 10*3/uL (ref 150–400)
RBC: 3.85 MIL/uL — ABNORMAL LOW (ref 4.22–5.81)
RDW: 12.4 % (ref 11.5–15.5)
WBC: 8.6 10*3/uL (ref 4.0–10.5)
nRBC: 0 % (ref 0.0–0.2)

## 2022-10-23 LAB — COMPREHENSIVE METABOLIC PANEL
ALT: 28 U/L (ref 0–44)
AST: 20 U/L (ref 15–41)
Albumin: 3.5 g/dL (ref 3.5–5.0)
Alkaline Phosphatase: 117 U/L (ref 38–126)
Anion gap: 12 (ref 5–15)
BUN: 25 mg/dL — ABNORMAL HIGH (ref 6–20)
CO2: 22 mmol/L (ref 22–32)
Calcium: 8.6 mg/dL — ABNORMAL LOW (ref 8.9–10.3)
Chloride: 94 mmol/L — ABNORMAL LOW (ref 98–111)
Creatinine, Ser: 1.09 mg/dL (ref 0.61–1.24)
GFR, Estimated: >60 mL/min (ref >60)
Glucose, Bld: 139 mg/dL — ABNORMAL HIGH (ref 70–99)
Potassium: 3.8 mmol/L (ref 3.5–5.1)
Sodium: 128 mmol/L — ABNORMAL LOW (ref 135–145)
Total Bilirubin: 0.5 mg/dL (ref 0.3–1.2)
Total Protein: 7.3 g/dL (ref 6.5–8.1)

## 2022-10-23 LAB — C3 COMPLEMENT: C3 Complement: 153 mg/dL (ref 82–167)

## 2022-10-23 LAB — OSMOLALITY: Osmolality: 285 mOsm/kg (ref 275–295)

## 2022-10-23 LAB — OSMOLALITY, URINE: Osmolality, Ur: 565 mOsm/kg (ref 300–900)

## 2022-10-23 LAB — ANA W/REFLEX IF POSITIVE: Anti Nuclear Antibody (ANA): NEGATIVE

## 2022-10-23 LAB — C4 COMPLEMENT: Complement C4, Body Fluid: 25 mg/dL (ref 12–38)

## 2022-10-23 LAB — MAGNESIUM: Magnesium: 2.4 mg/dL (ref 1.7–2.4)

## 2022-10-23 NOTE — Discharge Summary (Signed)
Physician Discharge Summary  Dylan Snyder [id number] DOB: Aug 28, 1970 DOA: 10/21/2022  PCP: Elsie Stain, MD  Admit date: 10/21/2022 Discharge date: 10/23/2022  Admitted From: home Disposition:  home  Recommendations for Outpatient Follow-up:  Follow up with PCP in 1-2 weeks Follow up on ANA, C3, C4 as an outpatient Follow up with dermatology in 1-2 weeks  Home Health: none Equipment/Devices: none  Discharge Condition: stable CODE STATUS: Full code Diet Orders (From admission, onward)     Start     Ordered   10/21/22 2321  Diet Heart Room service appropriate? Yes; Fluid consistency: Thin  Diet effective now       Question Answer Comment  Room service appropriate? Yes   Fluid consistency: Thin      10/21/22 2320            HPI: Per admitting MD, Dylan Snyder is a 52 y.o. 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 Noticed a painful rash to bilateral LE 2 days ago. Legs have also been more swollen. Wife thinks he has ascites to his abdomen. Today also felt focal mid-lumbar spine pain and could not get out of bed. Had trouble walking and increase weakness. Urinated on himself a few days ago because he could not make it to bathroom in time but denies actual incontinence. No saddle anesthesia.  He continues to drink and reports drinking about 5-6 beers a night but wife at bedside claims it is more 8-10. Does not have appear to have commitment to quit. Has short term memory loss but no worse than usual.  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  He is ARB and diuretics were held, and patient's renal function returned to baseline.  Continue to hold ARB upon discharge.  He is not eating and drinking well and feels back to baseline  Active problems Back pain -Prior to presentation, history of focal mid-lumbar spine pain and decrease ability to ambulate with increase weakness. MRI L-spine negative for any spinal canal stenosis, abscess, discitis or any other acute progress.  PT did not recommend follow-up Open wound of skin -pt with persistent wound adjacent to right of the umbilical region.  No systemic symptoms, continue local care Nonblanching rash lower legs/possible vasculitis (HCC) -presents with bilateral painful non-blanching palpable purpuric rash to bilateral distal lower extremity. Etiology uncertain.  Check ANA, C3, C4, pending at the time of discharge.  Could be due to underlying liver disease.  Needs dermatology within the next week or 2, Derm consult is unavailable in the hospital Wernicke-Korsakoff syndrome (alcoholic) ([place]) -Likely due to alcohol use. AxOx4 Hyponatremia -Hyponatremia is chronic, stable, improving.  Alcoholic cirrhosis of liver with ascites ([place]) -recently referred to [place] by primary care but has not  yet had appointment set up. Abdomen does not appear distended on exam. Does not believe he warrants any paracentesis at the moment. Continue spironolactone Dyslipidemia -continue Zetia, not on statin due to liver disease Type 2 diabetes mellitus with hyperglycemia, without long-term current use of insulin (HCC) -Controlled. Last A1C of 6.8 last month Essential  hypertension -continue home medications except ARB  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 ([place]) -continue Xarelto. Doppler ultrasound still shows chronic right LE DVT without much changes Alcohol use -will need complete cessation  Sepsis ruled out   Discharge Instructions   Allergies as of 10/23/2022   No Known Allergies      Medication List     STOP taking these medications    valsartan 160 MG tablet Commonly known as: DIOVAN       TAKE these medications    cloNIDine 0.1 MG tablet Commonly known as: CATAPRES Take 1 tablet (0.1 mg total) by mouth 2 (two) times daily.   ezetimibe 10 MG tablet Commonly known as: Zetia Take 1 tablet (10 mg total) by mouth daily.   folic acid 1 MG tablet Commonly known as: FOLVITE Take 1 tablet (1 mg total) by mouth daily.   gabapentin 400 MG capsule Commonly known as: NEURONTIN Take 2 capsules (800 mg total) by mouth 3 (three) times daily.   glipiZIDE 5 MG tablet Commonly known as: GLUCOTROL Take 1 tablet (5 mg total) by mouth 2 (two) times daily before a meal.   mometasone-formoterol 100-5 MCG/ACT Aero Commonly known as: DULERA Inhale 2 puffs into the lungs 2 (two) times daily.   pantoprazole 40 MG tablet Commonly known as: PROTONIX Take 1 tablet (40 mg total) by mouth daily.   pyridoxine 500 MG tablet Commonly known as: B-6 Take 1 tablet (500 mg total) by mouth daily.   spironolactone 25 MG tablet Commonly known as: Aldactone Take 1 tablet (25 mg total) by mouth daily.   Ventolin HFA 108 (90 Base) MCG/ACT inhaler Generic drug: albuterol Inhale 2 puffs into the lungs once every 6 (six) hours as needed for wheezing or shortness of breath.   vitamin B-1 250 MG tablet Take 1 tablet (250 mg total) by mouth daily.   Xarelto 20 MG Tabs tablet Generic drug: rivaroxaban Take 1 tablet (20 mg total) by mouth daily with supper.        Follow-up Information     Elsie Stain, MD Follow up in 1  week(s).   Specialty: Pulmonary Disease Contact information: [address] [place] [address] [phone]                 Consultations:   Procedures/Studies: none  VAS [place] LOWER EXTREMITY VENOUS (DVT)  Result Date: 10/23/2022  Lower Venous DVT Study Patient Name:  Dylan Snyder  Date of Exam:   10/21/2022 Medical Rec #: [id number]       Accession #:    [id number] Date of Birth: Dec 23, 1970       Patient Gender: M Patient Age:   9 years Exam Location:  [facility] Procedure:      VAS [place] LOWER EXTREMITY VENOUS (DVT) Referring Phys: Jerene Pitch SMALL --------------------------------------------------------------------------------  Indications: Bilateral skin changes/erythema distal calves. Other Indications: History of right lower extremity DVT 06-05-2019. Comparison Study: Multiple priors. Most recent 12-16-2020 right lower extremity                   venous showed chronic DVT. Performing Technologist: Darlin Coco RDMS,  RVT  Examination Guidelines: A complete evaluation includes B-mode imaging, spectral Doppler, color Doppler, and power Doppler as needed of all accessible portions of each vessel. Bilateral testing is considered an integral part of a complete examination. Limited examinations for reoccurring indications may be performed as noted. The reflux portion of the exam is performed with the patient in reverse Trendelenburg.  +---------+---------------+---------+-----------+----------+--------------+ RIGHT    CompressibilityPhasicitySpontaneityPropertiesThrombus Aging +---------+---------------+---------+-----------+----------+--------------+ CFV      Full           Yes      Yes                                 +---------+---------------+---------+-----------+----------+--------------+ SFJ      Full                                                        +---------+---------------+---------+-----------+----------+--------------+ FV Prox  Partial         Yes      Yes                  Chronic        +---------+---------------+---------+-----------+----------+--------------+ FV Mid   Partial                                      Chronic        +---------+---------------+---------+-----------+----------+--------------+ FV DistalPartial                                      Chronic        +---------+---------------+---------+-----------+----------+--------------+ PFV      Full                                                        +---------+---------------+---------+-----------+----------+--------------+ POP      Partial        Yes      Yes                                 +---------+---------------+---------+-----------+----------+--------------+ PTV      Full                                                        +---------+---------------+---------+-----------+----------+--------------+ PERO     Full                                                        +---------+---------------+---------+-----------+----------+--------------+   +---------+---------------+---------+-----------+----------+--------------+ LEFT     CompressibilityPhasicitySpontaneityPropertiesThrombus Aging +---------+---------------+---------+-----------+----------+--------------+ CFV      Full  Yes      Yes                                 +---------+---------------+---------+-----------+----------+--------------+ SFJ      Full                                                        +---------+---------------+---------+-----------+----------+--------------+ FV Prox  Full                                                        +---------+---------------+---------+-----------+----------+--------------+ FV Mid   Full                                                        +---------+---------------+---------+-----------+----------+--------------+ FV DistalFull                                                         +---------+---------------+---------+-----------+----------+--------------+ PFV      Full                                                        +---------+---------------+---------+-----------+----------+--------------+ POP      Full           Yes      Yes                                 +---------+---------------+---------+-----------+----------+--------------+ PTV      Full                                                        +---------+---------------+---------+-----------+----------+--------------+ PERO     Full                                                        +---------+---------------+---------+-----------+----------+--------------+     Summary: RIGHT: - Findings consistent with chronic deep vein thrombosis involving the right femoral vein. - Findings appear essentially unchanged compared to previous examination. - No cystic structure found in the popliteal fossa. - Ultrasound characteristics of enlarged lymph nodes are noted in the groin.  LEFT: - There is no evidence of deep vein thrombosis in the lower extremity.  - No cystic structure found in the  popliteal fossa.  *See table(s) above for measurements and observations. Electronically signed by Orlie Pollen on 10/23/2022 at 3:43:42 AM.    Final    MR Lumbar Spine W Wo Contrast  Result Date: 10/21/2022 CLINICAL DATA:  Weakness of bilateral legs, latter abscess EXAM: MRI LUMBAR SPINE WITHOUT AND WITH CONTRAST TECHNIQUE: Multiplanar and multiecho pulse sequences of the lumbar spine were obtained without and with intravenous contrast. CONTRAST:  66mL GADAVIST GADOBUTROL 1 MMOL/ML IV SOLN COMPARISON:  03/21/2020 FINDINGS: Segmentation: 5 lumbar type vertebral bodies. The lowest fully formed disc space is labeled L5-S1. Alignment:  Mild levocurvature.  No significant listhesis. Vertebrae: No fracture, evidence of discitis, or bone lesion. No abnormal enhancement. Conus medullaris and cauda equina: Conus extends to  the L1-L2 level. Conus and cauda equina appear normal. No abnormal enhancement. Paraspinal and other soft tissues: Negative. Disc levels: T12-L1: No significant disc bulge. No spinal canal stenosis or neural foraminal narrowing. L1-L2: No significant disc bulge. No spinal canal stenosis or neural foraminal narrowing. L2-L3: Minimal disc bulge. No spinal canal stenosis or neural foraminal narrowing. L3-L4: Minimal disc bulge with right foraminal annular fissure. No spinal canal stenosis or neural foraminal narrowing. L4-L5: Minimal disc bulge. Mild facet arthropathy. No spinal canal stenosis or neural foraminal narrowing. L5-S1: Mild disc bulge with central protrusion with annular fissure. Mild facet arthropathy. No spinal canal stenosis or neural foraminal narrowing. IMPRESSION: 1. No spinal canal stenosis or neural foraminal narrowing. 2. No evidence of spinal abscess, discitis-osteomyelitis, or other acute process in the lumbar spine. 3. Annular fissures in the right foramen at L3-L4 and centrally at L5-S1, which can also be a cause of back pain. 4. Mild facet arthropathy at L4-L5 and L5-S1, which can also be a cause of back pain. Electronically Signed   By: Merilyn Baba M.D.   On: 10/21/2022 18:54   DG Chest 2 View  Result Date: 10/21/2022 CLINICAL DATA:  Shortness of breath and cough. EXAM: CHEST - 2 VIEW COMPARISON:  04/18/2020 FINDINGS: The lungs are clear without focal pneumonia, edema, pneumothorax or pleural effusion. The cardiopericardial silhouette is within normal limits for size. The visualized bony structures of the thorax are unremarkable. IMPRESSION: No active cardiopulmonary disease. Electronically Signed   By: Misty Stanley M.D.   On: 10/21/2022 13:31   CT CHEST LUNG CA SCREEN LOW DOSE W/O CM  Result Date: 10/16/2022 CLINICAL DATA:  52 year old male current smoker with 31 pack-year history of smoking. Lung cancer screening examination. EXAM: CT CHEST WITHOUT CONTRAST LOW-DOSE FOR LUNG  CANCER SCREENING TECHNIQUE: Multidetector CT imaging of the chest was performed following the standard protocol without IV contrast. RADIATION DOSE REDUCTION: This exam was performed according to the departmental dose-optimization program which includes automated exposure control, adjustment of the mA and/or kV according to patient size and/or use of iterative reconstruction technique. COMPARISON:  No priors. FINDINGS: Cardiovascular: Heart size is normal. There is no significant pericardial fluid, thickening or pericardial calcification. Aortic atherosclerosis. No definite coronary artery calcifications. Mediastinum/Nodes: There are multiple prominent but nonenlarged mediastinal and bilateral hilar lymph nodes measuring up to 1.2 cm in short axis, nonspecific. Esophagus is unremarkable in appearance. Multiple prominent borderline enlarged axillary lymph nodes are also noted, nonspecific. Lungs/Pleura: No suspicious appearing pulmonary nodules or masses are noted. No acute consolidative airspace disease. No pleural effusions. Mild diffuse bronchial wall thickening with very mild centrilobular and paraseptal emphysema. Upper Abdomen: Metallic density in the inferior vena cava partially imaged, presumably an IVC filter. Musculoskeletal: There are no  aggressive appearing lytic or blastic lesions noted in the visualized portions of the skeleton. IMPRESSION: 1. Lung-RADS 1, negative. Continue annual screening with low-dose chest CT without contrast in 12 months. 2. Aortic atherosclerosis. 3. Mild diffuse bronchial wall thickening with very mild centrilobular and paraseptal emphysema; imaging findings suggestive of underlying COPD. Aortic Atherosclerosis (ICD10-I70.0) and Emphysema (ICD10-J43.9). Electronically Signed   By: Vinnie Langton M.D.   On: 10/16/2022 12:39   US Abdomen Limited RUQ (LIVER/GB)  Result Date: 10/09/2022 CLINICAL DATA:  Ascites.  Cirrhosis.  Epigastric pain. EXAM: ULTRASOUND ABDOMEN LIMITED  RIGHT UPPER QUADRANT COMPARISON:  None Available. FINDINGS: Gallbladder: Cholelithiasis.  The gallbladder is otherwise normal. Common bile duct: Diameter: 4.1 mm Liver: Increased heterogeneous echogenicity with a nodular contour. No focal mass. Portal vein is patent on color Doppler imaging with normal direction of blood flow towards the liver. Other: None. IMPRESSION: 1. Cirrhotic liver.  No liver mass identified. 2. While the increased echogenicity in the liver is at least partially due to cirrhosis, the patient also has known hepatic steatosis identified on previous CT imaging. 3. Cholelithiasis. Electronically Signed   By: Dorise Bullion III M.D.   On: 10/09/2022 11:05     Subjective: - no chest pain, shortness of breath, no abdominal pain, nausea or vomiting. Wants to go home  Discharge Exam: BP 106/60 (BP Location: Right Arm)   Pulse 96   Temp 98.2 F (36.8 C) (Oral)   Resp 19   Ht 6\' 2"  (1.88 m)   Wt 126 kg   SpO2 97%   BMI 35.66 kg/m   General: Pt is alert, awake, not in acute distress Extremities: no edema, no cyanosis Skin: no blanching dark colored rash below the knees bilaterally   The results of significant diagnostics from this hospitalization (including imaging, microbiology, ancillary and laboratory) are listed below for reference.     Microbiology: No results found for this or any previous visit (from the past 240 hour(s)).   Labs: Basic Metabolic Panel: Recent Labs  Lab 10/21/22 1407 10/22/22 0019 10/23/22 0433  NA 124* 127* 128*  K 4.7 3.8 3.8  CL 91* 95* 94*  CO2 21* 20* 22  GLUCOSE 103* 119* 139*  BUN 30* 33* 25*  CREATININE 1.52* 1.48* 1.09  CALCIUM 8.8* 9.2 8.6*  MG  --   --  2.4   Liver Function Tests: Recent Labs  Lab 10/21/22 1407 10/23/22 0433  AST 26 20  ALT 29 28  ALKPHOS 95 117  BILITOT 1.6* 0.5  PROT 7.6 7.3  ALBUMIN 3.5 3.5   CBC: Recent Labs  Lab 10/21/22 1407 10/22/22 0019 10/23/22 0433  WBC 10.1 9.6 8.6  NEUTROABS  7.9*  --  5.4  HGB 13.0 14.0 12.6*  HCT 37.4* 40.3 36.8*  MCV 94.2 94.6 95.6  PLT 289 299 342   CBG: No results for input(s): "GLUCAP" in the last 168 hours. Hgb A1c No results for input(s): "HGBA1C" in the last 72 hours. Lipid Profile No results for input(s): "CHOL", "HDL", "LDLCALC", "TRIG", "CHOLHDL", "LDLDIRECT" in the last 72 hours. Thyroid function studies Recent Labs    10/22/22 1814  TSH 2.946   Urinalysis    Component Value Date/Time   COLORURINE YELLOW 10/22/2022 1806   APPEARANCEUR CLEAR 10/22/2022 1806   LABSPEC 1.015 10/22/2022 1806   PHURINE 5.0 10/22/2022 1806   GLUCOSEU 50 (A) 10/22/2022 1806   HGBUR SMALL (A) 10/22/2022 1806   BILIRUBINUR NEGATIVE 10/22/2022 1806   KETONESUR NEGATIVE 10/22/2022 1806  PROTEINUR NEGATIVE 10/22/2022 1806   NITRITE NEGATIVE 10/22/2022 1806   LEUKOCYTESUR NEGATIVE 10/22/2022 1806    FURTHER DISCHARGE INSTRUCTIONS:   Get Medicines reviewed and adjusted: Please take all your medications with you for your next visit with your Primary MD   Laboratory/radiological data: Please request your Primary MD to go over all hospital tests and procedure/radiological results at the follow up, please ask your Primary MD to get all Hospital records sent to his/her office.   In some cases, they will be blood work, cultures and biopsy results pending at the time of your discharge. Please request that your primary care M.D. goes through all the records of your hospital data and follows up on these results.   Also Note the following: If you experience worsening of your admission symptoms, develop shortness of breath, life threatening emergency, suicidal or homicidal thoughts you must seek medical attention immediately by calling 911 or calling your MD immediately  if symptoms less severe.   You must read complete instructions/literature along with all the possible adverse reactions/side effects for all the Medicines you take and that have been  prescribed to you. Take any new Medicines after you have completely understood and accpet all the possible adverse reactions/side effects.    Do not drive when taking Pain medications or sleeping medications (Benzodaizepines)   Do not take more than prescribed Pain, Sleep and Anxiety Medications. It is not advisable to combine anxiety,sleep and pain medications without talking with your primary care practitioner   Special Instructions: If you have smked or chewed Tobacco  in the last 2 yrs please stop smoking, stop any regular Alcohol  and or any Recreational drug use.   Wear Seat belts while driving.   Please note: You were cared for by a hospitalist during your hospital stay. Once you are discharged, your primary care physician will handle any further medical issues. Please note that NO REFILLS for any discharge medications will be authorized once you are discharged, as it is imperative that you return to your primary care physician (or establish a relationship with a primary care physician if you do not have one) for your post hospital discharge needs so that they can reassess your need for medications and monitor your lab values.  Time coordinating discharge: 40 minutes  SIGNED:  Marzetta Board, MD, PhD 10/23/2022, 7:53 AM

## 2022-10-24 ENCOUNTER — Telehealth: Payer: Self-pay

## 2022-10-24 NOTE — Transitions of Care (Post Inpatient/ED Visit) (Signed)
   10/24/2022  Name: Dylan Snyder MRN: [id number] DOB: 03/04/1971  Today's TOC FU Call Status: Today's TOC FU Call Status:: Successful TOC FU Call Competed TOC FU Call Complete Date: 10/24/22  Transition Care Management Follow-up Telephone Call Date of Discharge: 10/23/22 Discharge Facility: [facility]) Type of Discharge: Inpatient Admission Primary Inpatient Discharge Diagnosis:: Wernicke-Korsakoff Syndrome How have you been since you were released from the hospital?: Better (He said he is not feeling too bad, but his legs still hurt) Any questions or concerns?: No  Items Reviewed: Did you receive and understand the discharge instructions provided?: Yes Medications obtained and verified?: Yes (Medications Reviewed) (He said he has all of his medications and will  review them when he comes to his appt next week. He did not have any questions about the meds at this time) Any new allergies since your discharge?: No Dietary orders reviewed?: Yes Type of Diet Ordered:: heart healthy Do you have support at home?: Yes People in Home: spouse Name of Support/Comfort Primary Source: wife  [place] and Equipment/Supplies: [place] Ordered?: No Any new equipment or medical supplies ordered?: No  Functional Questionnaire: Do you need assistance with bathing/showering or dressing?: No Do you need assistance with meal preparation?: No Do you need assistance with eating?: No Do you have difficulty maintaining continence: No Do you need assistance with getting out of bed/getting out of a chair/moving?: Yes (He has a cane to use when needed.  He also said that his wife is available to help him.) Do you have difficulty managing or taking your medications?: Yes (his wife assists if needed)  Follow up appointments reviewed: PCP Follow-up appointment confirmed?: Yes Date of PCP follow-up appointment?: 11/02/22 Follow-up Provider: [facility] Follow-up  appointment confirmed?: Yes Date of Specialist follow-up appointment?: 01/03/23 Follow-Up Specialty Provider:: GI Do you need transportation to your follow-up appointment?: No Do you understand care options if your condition(s) worsen?: Yes-patient verbalized understanding    SIGNATURE Eden Lathe, RN

## 2022-10-26 ENCOUNTER — Other Ambulatory Visit: Payer: Self-pay

## 2022-10-26 NOTE — Telephone Encounter (Signed)
LCSWA called patient today to introduce herself and to assess patients' mental health needs. Patient reported he was not interested in outpatient care. Patient was referred by PCP for alcohol abuse, needs detox and outpatient rehab.      [facility] -[place], [place], [place] or [phone] 24hrs a day. 24hours they are open for walk-ins. Free for those who do not have insurance. Patient would need to bring any medications they may be taking with them.

## 2022-11-02 ENCOUNTER — Ambulatory Visit: Payer: Medicaid Other | Attending: Critical Care Medicine | Admitting: Critical Care Medicine

## 2022-11-02 ENCOUNTER — Other Ambulatory Visit: Payer: Self-pay

## 2022-11-02 ENCOUNTER — Telehealth: Payer: Self-pay | Admitting: Critical Care Medicine

## 2022-11-02 ENCOUNTER — Encounter: Payer: Self-pay | Admitting: Critical Care Medicine

## 2022-11-02 VITALS — BP 157/99 | HR 79 | Wt 269.6 lb

## 2022-11-02 DIAGNOSIS — Z7984 Long term (current) use of oral hypoglycemic drugs: Secondary | ICD-10-CM | POA: Diagnosis not present

## 2022-11-02 DIAGNOSIS — L03115 Cellulitis of right lower limb: Secondary | ICD-10-CM | POA: Diagnosis not present

## 2022-11-02 DIAGNOSIS — K7031 Alcoholic cirrhosis of liver with ascites: Secondary | ICD-10-CM | POA: Diagnosis not present

## 2022-11-02 DIAGNOSIS — E1165 Type 2 diabetes mellitus with hyperglycemia: Secondary | ICD-10-CM | POA: Diagnosis not present

## 2022-11-02 DIAGNOSIS — N179 Acute kidney failure, unspecified: Secondary | ICD-10-CM

## 2022-11-02 DIAGNOSIS — F04 Amnestic disorder due to known physiological condition: Secondary | ICD-10-CM

## 2022-11-02 DIAGNOSIS — L0390 Cellulitis, unspecified: Secondary | ICD-10-CM | POA: Insufficient documentation

## 2022-11-02 DIAGNOSIS — F1010 Alcohol abuse, uncomplicated: Secondary | ICD-10-CM | POA: Insufficient documentation

## 2022-11-02 DIAGNOSIS — F1096 Alcohol use, unspecified with alcohol-induced persisting amnestic disorder: Secondary | ICD-10-CM

## 2022-11-02 MED ORDER — METOPROLOL TARTRATE 75 MG PO TABS
75.0000 mg | ORAL_TABLET | Freq: Two times a day (BID) | ORAL | 2 refills | Status: DC
  Filled 2022-11-02: qty 60, 30d supply, fill #0
  Filled 2022-12-03: qty 60, 30d supply, fill #1
  Filled 2022-12-15: qty 60, 30d supply, fill #2

## 2022-11-02 MED ORDER — B-1 500 MG PO TABS
250.0000 mg | ORAL_TABLET | Freq: Every day | ORAL | 2 refills | Status: DC
  Filled 2022-11-02 – 2022-12-15 (×9): qty 90, fill #0

## 2022-11-02 MED ORDER — MUPIROCIN 2 % EX OINT
1.0000 | TOPICAL_OINTMENT | Freq: Two times a day (BID) | CUTANEOUS | 0 refills | Status: DC
  Filled 2022-11-02: qty 22, 11d supply, fill #0

## 2022-11-02 MED ORDER — METOPROLOL TARTRATE 50 MG PO TABS: 50.0000 mg | ORAL_TABLET | Freq: Two times a day (BID) | ORAL | 3 refills | Status: DC

## 2022-11-02 MED ORDER — SPIRONOLACTONE 50 MG PO TABS
50.0000 mg | ORAL_TABLET | Freq: Every day | ORAL | 2 refills | Status: DC
  Filled 2022-11-02: qty 90, 90d supply, fill #0

## 2022-11-02 MED ORDER — DOXYCYCLINE HYCLATE 100 MG PO TABS
100.0000 mg | ORAL_TABLET | Freq: Two times a day (BID) | ORAL | 0 refills | Status: AC
  Filled 2022-11-02: qty 20, 10d supply, fill #0

## 2022-11-02 MED ORDER — PANTOPRAZOLE SODIUM 40 MG PO TBEC
40.0000 mg | DELAYED_RELEASE_TABLET | Freq: Every day | ORAL | 2 refills | Status: DC
  Filled 2022-11-02: qty 60, 60d supply, fill #0
  Filled 2022-11-11: qty 30, 30d supply, fill #0
  Filled 2022-11-13: qty 60, 60d supply, fill #0

## 2022-11-02 MED ORDER — CLONIDINE HCL 0.1 MG PO TABS
0.1000 mg | ORAL_TABLET | Freq: Two times a day (BID) | ORAL | 2 refills | Status: DC
  Filled 2022-11-02 – 2022-11-13 (×3): qty 120, 60d supply, fill #0

## 2022-11-02 NOTE — Telephone Encounter (Signed)
Please see this patient to provide him with alcohol detox and residential alcohol rehab he has Medicaid

## 2022-11-02 NOTE — Assessment & Plan Note (Signed)
Increase thiamine dose check thiamine level
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
## 2022-11-02 NOTE — Patient Instructions (Addendum)
Referral to our licensed social worker was made for alcohol counseling Place dressing on wound on your umbilicus changes daily for 2 weeks and start doxycycline twice daily for the wound and also for the right lower leg infection Referral to dermatology will be made Complete lab screening including thiamine levels will be drawn Refills on all medications are made will increase spironolactone to 50 mg a day increase metoprolol to 75 mg twice daily   Return to Dr. Joya Gaskins 1 month  Keep gastroenterology appointment

## 2022-11-02 NOTE — Assessment & Plan Note (Signed)
Will begin doxycycline 100 mg twice daily for 10 days and refer to dermatology

## 2022-11-02 NOTE — Assessment & Plan Note (Signed)
Reassess renal function 

## 2022-11-02 NOTE — Assessment & Plan Note (Signed)
Referral to clinical social work for alcohol detox and rehab

## 2022-11-06 ENCOUNTER — Telehealth: Payer: Self-pay | Admitting: Licensed Clinical Social Worker

## 2022-11-06 LAB — CBC WITH DIFFERENTIAL/PLATELET
Basophils Absolute: 0.1 10*3/uL (ref 0.0–0.2)
Basos: 1 %
EOS (ABSOLUTE): 0.2 10*3/uL (ref 0.0–0.4)
Eos: 2 %
Hematocrit: 37.9 % (ref 37.5–51.0)
Hemoglobin: 13.1 g/dL (ref 13.0–17.7)
Immature Grans (Abs): 0.1 10*3/uL (ref 0.0–0.1)
Immature Granulocytes: 1 %
Lymphocytes Absolute: 2.4 10*3/uL (ref 0.7–3.1)
Lymphs: 22 %
MCH: 31.9 pg (ref 26.6–33.0)
MCHC: 34.6 g/dL (ref 31.5–35.7)
MCV: 92 fL (ref 79–97)
Monocytes Absolute: 0.8 10*3/uL (ref 0.1–0.9)
Monocytes: 7 %
Neutrophils Absolute: 7.3 10*3/uL — ABNORMAL HIGH (ref 1.4–7.0)
Neutrophils: 67 %
Platelets: 593 10*3/uL — ABNORMAL HIGH (ref 150–450)
RBC: 4.11 x10E6/uL — ABNORMAL LOW (ref 4.14–5.80)
RDW: 11.6 % (ref 11.6–15.4)
WBC: 10.8 10*3/uL (ref 3.4–10.8)

## 2022-11-06 LAB — COMPREHENSIVE METABOLIC PANEL
ALT: 18 IU/L (ref 0–44)
AST: 17 IU/L (ref 0–40)
Albumin/Globulin Ratio: 1.1 — ABNORMAL LOW (ref 1.2–2.2)
Albumin: 3.8 g/dL — ABNORMAL LOW (ref 4.1–5.1)
Alkaline Phosphatase: 117 IU/L (ref 44–121)
BUN/Creatinine Ratio: 13 (ref 9–20)
BUN: 9 mg/dL (ref 6–24)
Bilirubin Total: 0.5 mg/dL (ref 0.0–1.2)
CO2: 22 mmol/L (ref 20–29)
Calcium: 9.9 mg/dL (ref 8.7–10.2)
Chloride: 100 mmol/L (ref 96–106)
Creatinine, Ser: 0.68 mg/dL — ABNORMAL LOW (ref 0.76–1.27)
Globulin, Total: 3.5 g/dL (ref 1.5–4.5)
Glucose: 113 mg/dL — ABNORMAL HIGH (ref 70–99)
Potassium: 4.6 mmol/L (ref 3.5–5.2)
Sodium: 136 mmol/L (ref 134–144)
Total Protein: 7.3 g/dL (ref 6.0–8.5)
eGFR: 113 mL/min/{1.73_m2} (ref >59)

## 2022-11-06 LAB — VITAMIN B1: Thiamine: 206.6 nmol/L — ABNORMAL HIGH (ref 66.5–200.0)

## 2022-11-06 NOTE — Progress Notes (Signed)
Let pt know all labs normal, thiamine levels normal stay on current thiamine dose

## 2022-11-06 NOTE — Telephone Encounter (Signed)
LCSWA called patient today to introduce herself and to assess patients' mental health needs. Patient did not want to be in a program as of now due to financial things. Pt stated him and his wife have removed all alcohol and will not be drinking any more. Patient was referred by PCP for alcohol detox and residential alcohol rehab he has [facility]    [facility] -[address] [place], [place] [phone] or [phone] 24hrs a day. 24hours they are open for walk-ins. Free for those who do not have insurance. Patient would need to bring any medications they may be taking with them.   Counseling Resources   [email or web address].br  [facility] [address], [place], [place] [address] [phone] or [phone] Walk-in urgent care 24/7 for anyone  For [facility] ONLY New patient assessments and therapy walk-ins: Monday and Wednesday 8am-11am First and second Friday 1pm-5pm New patient psychiatry and medication management walk-ins: Mondays, Wednesdays, Thursdays, Fridays 8am-11am No psychiatry walk-ins on Tuesday   *Accepts all insurance and uninsured for Urgent Care needs *Accepts Medicaid and uninsured for outpatient treatment   [facility] (Therapy and psychiatry) [facility] (near [employer]) [address] [place], [place] [address] [phone] Fax: [phone] (INSURANCE REQUIRED-MEDICAID ACCEPTED)   [facility] at [facility] [address] [place] [address] [place],  [place]  [address] [phone] Call for appointment  [facility] (Therapy only)  [facility] [address] [place], [place] [address] Monday - Friday: 8:30 a.m.-12 p.m. / 1 p.m.-2:30 p.m.  [facility] [address] [place], [place] [address] Monday-Friday: 8:30 a.m.-12 p.m. / 2-3:30 p.m. (INSURANCE REQUIRED -MEDICAID  ACCEPTED) They do offer a sliding fee scale $20-$30/session   [facility] [address] [place], [place] [address] Phone: [phone]  [facility] [address] [place] [address]  Phone: [phone] (Does not accept Medicaid) (only one provider accepts Medicare)  [facility] [address] (at [employer], [place] [address] (Accepts Medicaid and Medicare)  [facility]) [address] [place] [address]  [place], [place] [address]  Phone: [phone]  [address] [place], [place] [address] Phone: [phone] [facility] Medicaid) [facility] (Therapy only)  [address], [place], [place] [address] Phone: [phone]   [facility] (Therapy only)  [address] [place], [place] [address] Phone: [phone] [facility] & Medicare)   [facility] [address], [address] [place], [place] [address] Phone: [phone] (Accepts [place] Health Choice) [facility] [phone] [address] [place], [place] [address] Phone: [phone] [facility]) [facility] (Psychiatry only)  [phone] [address], [place], [place] [address] (Accepts Medicaid and Medicare) [facility] (Psychiatry and therapy)  [address] [place], [place] [address] [phone] [facility] Medicare) [facility] (psychiatry and therapy) [address], [place], [place] [address] [phone]  [facility] at [address], [place], [place] [address] [phone] Accept [address] [place], [place], [place], [place],  and the following types of Medicaid; Alliance, [place], Partners, [place], [place] Health Choice, [employer], [employer] [place], [place], and [place], as well as offering a Careers adviser and private payment options. Provides In-Office  Appointments, Virtual Appointments, and Phone Consultations Offers medication management for ages  1 years old and up, including,  Medication Management for Suboxone and Vivitrol  [facility] [phone] [address], [place], [place] [address] (Accepts Medicaid and Medicare)         19.  [facility] (therapy only)  [address] [place], [place] [address]            [phone] (Accepts medicare) 20. [facility]  (Suboxone and methodone) [phone] [address], [place], [place] [address] To Be Eligible for Opioid Treatment at [facility] you must be at least 50 years of age you have already tried other interventions that were not successful such as opioid detox, inpatient rehab for opioids, or outpatient counseling specifically for opioid dependency your [facility] drug test must be completely free of benzodiazepines (klonopin, xanax, valium, ativan, or other benz) you have reliable transportation to the [facility] clinic in [place] you recognize that counseling is a critical component of [facility]' Opioid Program and you agree to attend all required counseling sessions you are committed to total drug abstinence and will conscientiously strive to remain free of alcohol, marijuana, and other illicit substances while in treatment you desire a peaceful treatment atmosphere in which personal responsibility and respect toward staff and clients is the norm   21. [facility] [address] [place], [place], [place] [address] Offers SAIOP (Substance Abuse Intensive Outpatient Program) [phone] 22. [facility] [address] [place], [place] [address] [phone] (Accepts medicare)  For those who are tech savvy, go on psychology today, type in your local city ([place].e. [place]) and specify your insurance at the top of the screen after you search. (Medicaid if needed). You can also specify whether you are interested in therapy and  psychiatry.  www.psychologytoday.com/us

## 2022-11-07 ENCOUNTER — Telehealth: Payer: Self-pay

## 2022-11-07 NOTE — Telephone Encounter (Signed)
-----   Message from Storm Frisk, MD sent at 11/06/2022  5:26 PM EDT ----- Let pt know all labs normal, thiamine levels normal stay on current thiamine dose

## 2022-11-07 NOTE — Telephone Encounter (Signed)
Pt was called and is aware of results, DOB was confirmed.  ?

## 2022-11-10 ENCOUNTER — Other Ambulatory Visit: Payer: Self-pay

## 2022-11-13 ENCOUNTER — Other Ambulatory Visit: Payer: Self-pay

## 2022-11-14 ENCOUNTER — Other Ambulatory Visit: Payer: Self-pay

## 2022-11-15 ENCOUNTER — Other Ambulatory Visit: Payer: Self-pay

## 2022-11-15 ENCOUNTER — Encounter: Payer: Self-pay | Admitting: Critical Care Medicine

## 2022-11-16 ENCOUNTER — Other Ambulatory Visit: Payer: Self-pay

## 2022-11-16 MED ORDER — GABAPENTIN 400 MG PO CAPS
800.0000 mg | ORAL_CAPSULE | Freq: Four times a day (QID) | ORAL | 2 refills | Status: DC
  Filled 2022-11-16: qty 180, 23d supply, fill #0
  Filled 2022-12-03 – 2022-12-06 (×2): qty 180, 23d supply, fill #1
  Filled 2022-12-15: qty 180, 23d supply, fill #2

## 2022-11-20 ENCOUNTER — Other Ambulatory Visit: Payer: Self-pay

## 2022-12-04 ENCOUNTER — Other Ambulatory Visit: Payer: Self-pay

## 2022-12-06 ENCOUNTER — Other Ambulatory Visit: Payer: Self-pay

## 2022-12-12 ENCOUNTER — Other Ambulatory Visit: Payer: Self-pay

## 2022-12-13 ENCOUNTER — Other Ambulatory Visit: Payer: Self-pay

## 2022-12-14 ENCOUNTER — Telehealth: Payer: Self-pay | Admitting: Critical Care Medicine

## 2022-12-14 ENCOUNTER — Telehealth: Payer: Self-pay | Admitting: Licensed Clinical Social Worker

## 2022-12-14 NOTE — Telephone Encounter (Signed)
Got it Talking to wife now

## 2022-12-14 NOTE — Telephone Encounter (Addendum)
Pt wife called to f/u, requesting to speak with the social worker. She said she wants to get the patient into a rehab facility ASAP.    Please advise

## 2022-12-14 NOTE — Telephone Encounter (Signed)
Copied from CRM [id number]. Topic: General - Other >> Dec 14, 2022  9:20 AM Franchot Heidelberg wrote: Reason for CRM: Pt's significant other called requesting to speak to a social worker about the patient  Best contact: [phone]

## 2022-12-14 NOTE — Telephone Encounter (Signed)
This is the first time part of this I have not received any calls previous  Getting an involuntary commitment is extremely difficult and will require a judge hearing as an outpatient  I would recommend [facility] in [place] they can detox him and provide medical support and I am happy to write prescriptions for him  You have provided that information below and they are open for walk-ins he should bring all his medications with him and if he is admitted we can give him 60 to 90-day supplies of all his medications going forward those can be filled at the pharmacy and the wife can take them over to him and he should take existing medications with him

## 2022-12-14 NOTE — Telephone Encounter (Signed)
I spoke with Pt wife today in regards to finding a rehab facility for him.  She said she wants to get the patient into a rehab facility ASAP.   Wife wanted me to share with Dr. Delford Field that she called to follow up with me because he did not answer any of my calls. She would like Dr. Delford Field to have a heads up that she is trying to get him to go to rehab. I suggested [facility] and a few other locations listed below. She wants to have involuntary committed but the process has not been easy.   [email or web address] -[address] [place], [place] [phone] or [phone] 24hrs a day. 24hours they are open for walk-ins. Free for those who do not have insurance. Patient would need to bring any medications they may be taking with them.    Clinics in [place], [place]  [facility] [address], [place] [address] [phone] Mon-Fri 6:30-10am  [facility]  [address], [place] [address] New patients- [phone]  -Accepts Medicaid and other commercial insurances.  -Payment plans start at $15 a day.  -New Patients should call number to schedule an appointment  [facility] [address] [place], [place] [address] [phone] & [phone] -Accepts Medicaid and other commercial insurances. -New patients should call number to complete new patient assessment   [facility] [address] [place], [address] [place], [place] [address] Phone: [phone] -Accepts Medicaid and other commercial insurances.

## 2022-12-18 ENCOUNTER — Other Ambulatory Visit: Payer: Self-pay

## 2022-12-18 NOTE — Telephone Encounter (Signed)
Thank you. You are Correct I suggested that but she doesn't think he will go. I will call them again to follow up.
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
## 2022-12-19 ENCOUNTER — Other Ambulatory Visit: Payer: Self-pay

## 2022-12-19 ENCOUNTER — Ambulatory Visit: Payer: Medicaid Other | Attending: Critical Care Medicine | Admitting: Critical Care Medicine

## 2022-12-19 ENCOUNTER — Encounter: Payer: Self-pay | Admitting: Critical Care Medicine

## 2022-12-19 ENCOUNTER — Telehealth: Payer: Self-pay | Admitting: Critical Care Medicine

## 2022-12-19 VITALS — BP 147/94 | HR 71 | Wt 265.8 lb

## 2022-12-19 DIAGNOSIS — K7031 Alcoholic cirrhosis of liver with ascites: Secondary | ICD-10-CM | POA: Insufficient documentation

## 2022-12-19 DIAGNOSIS — F1096 Alcohol use, unspecified with alcohol-induced persisting amnestic disorder: Secondary | ICD-10-CM | POA: Diagnosis not present

## 2022-12-19 DIAGNOSIS — N179 Acute kidney failure, unspecified: Secondary | ICD-10-CM

## 2022-12-19 DIAGNOSIS — F17210 Nicotine dependence, cigarettes, uncomplicated: Secondary | ICD-10-CM | POA: Insufficient documentation

## 2022-12-19 DIAGNOSIS — I10 Essential (primary) hypertension: Secondary | ICD-10-CM | POA: Diagnosis not present

## 2022-12-19 DIAGNOSIS — Z7984 Long term (current) use of oral hypoglycemic drugs: Secondary | ICD-10-CM

## 2022-12-19 DIAGNOSIS — Z7901 Long term (current) use of anticoagulants: Secondary | ICD-10-CM | POA: Diagnosis not present

## 2022-12-19 DIAGNOSIS — I82511 Chronic embolism and thrombosis of right femoral vein: Secondary | ICD-10-CM | POA: Diagnosis not present

## 2022-12-19 DIAGNOSIS — E1165 Type 2 diabetes mellitus with hyperglycemia: Secondary | ICD-10-CM | POA: Diagnosis not present

## 2022-12-19 DIAGNOSIS — Z760 Encounter for issue of repeat prescription: Secondary | ICD-10-CM | POA: Diagnosis present

## 2022-12-19 DIAGNOSIS — Z79899 Other long term (current) drug therapy: Secondary | ICD-10-CM | POA: Diagnosis not present

## 2022-12-19 DIAGNOSIS — E785 Hyperlipidemia, unspecified: Secondary | ICD-10-CM | POA: Diagnosis not present

## 2022-12-19 DIAGNOSIS — I82501 Chronic embolism and thrombosis of unspecified deep veins of right lower extremity: Secondary | ICD-10-CM | POA: Insufficient documentation

## 2022-12-19 DIAGNOSIS — F1010 Alcohol abuse, uncomplicated: Secondary | ICD-10-CM

## 2022-12-19 LAB — GLUCOSE, POCT (MANUAL RESULT ENTRY): POC Glucose: 151 mg/dl — AB (ref 70–99)

## 2022-12-19 MED ORDER — RIVAROXABAN 20 MG PO TABS
20.0000 mg | ORAL_TABLET | Freq: Every day | ORAL | 2 refills | Status: DC
  Filled 2022-12-19 – 2023-02-12 (×3): qty 60, 60d supply, fill #0
  Filled 2023-04-09: qty 30, 30d supply, fill #1
  Filled 2023-05-22: qty 30, 30d supply, fill #2
  Filled 2023-06-16: qty 30, 30d supply, fill #3
  Filled 2023-07-16: qty 30, 30d supply, fill #4

## 2022-12-19 MED ORDER — METOPROLOL TARTRATE 75 MG PO TABS
75.0000 mg | ORAL_TABLET | Freq: Two times a day (BID) | ORAL | 2 refills | Status: DC
  Filled 2022-12-19 – 2022-12-31 (×2): qty 120, 60d supply, fill #0
  Filled 2023-02-27 – 2023-03-06 (×2): qty 120, 60d supply, fill #1
  Filled 2023-03-20: qty 60, 30d supply, fill #1
  Filled 2023-03-20: qty 120, 60d supply, fill #1
  Filled 2023-04-26: qty 60, 30d supply, fill #2
  Filled 2023-05-22: qty 60, 30d supply, fill #3
  Filled 2023-06-21 – 2023-07-03 (×2): qty 60, 30d supply, fill #4

## 2022-12-19 MED ORDER — EZETIMIBE 10 MG PO TABS
10.0000 mg | ORAL_TABLET | Freq: Every day | ORAL | 3 refills | Status: DC
  Filled 2022-12-19 – 2023-03-06 (×2): qty 90, 90d supply, fill #0
  Filled 2023-05-30 (×2): qty 90, 90d supply, fill #1

## 2022-12-19 MED ORDER — PYRIDOXINE HCL 500 MG PO TABS
500.0000 mg | ORAL_TABLET | Freq: Every day | ORAL | 4 refills | Status: DC
  Filled 2022-12-19 – 2023-01-26 (×6): qty 60, 60d supply, fill #0

## 2022-12-19 MED ORDER — B-1 500 MG PO TABS
250.0000 mg | ORAL_TABLET | Freq: Every day | ORAL | 2 refills | Status: DC
  Filled 2022-12-19 – 2023-01-26 (×5): qty 90, fill #0

## 2022-12-19 MED ORDER — GLIPIZIDE 5 MG PO TABS
5.0000 mg | ORAL_TABLET | Freq: Two times a day (BID) | ORAL | 3 refills | Status: DC
  Filled 2022-12-19 – 2023-01-13 (×2): qty 60, 30d supply, fill #0
  Filled 2023-02-12 (×2): qty 60, 30d supply, fill #1
  Filled 2023-03-20 (×2): qty 60, 30d supply, fill #2
  Filled 2023-04-26: qty 60, 30d supply, fill #3

## 2022-12-19 MED ORDER — SPIRONOLACTONE 50 MG PO TABS
50.0000 mg | ORAL_TABLET | Freq: Every day | ORAL | 2 refills | Status: DC
  Filled 2022-12-19 – 2023-01-26 (×2): qty 90, 90d supply, fill #0
  Filled 2023-04-26: qty 90, 90d supply, fill #1

## 2022-12-19 MED ORDER — CLONIDINE HCL 0.1 MG PO TABS
0.1000 mg | ORAL_TABLET | Freq: Two times a day (BID) | ORAL | 2 refills | Status: DC
  Filled 2022-12-19: qty 120, 60d supply, fill #0
  Filled 2023-03-20: qty 120, 60d supply, fill #1
  Filled 2023-03-20: qty 60, 30d supply, fill #1
  Filled 2023-04-26: qty 60, 30d supply, fill #2
  Filled 2023-05-22: qty 60, 30d supply, fill #3
  Filled 2023-06-21 – 2023-07-03 (×2): qty 60, 30d supply, fill #4

## 2022-12-19 MED ORDER — GABAPENTIN 400 MG PO CAPS
800.0000 mg | ORAL_CAPSULE | Freq: Four times a day (QID) | ORAL | 2 refills | Status: DC
  Filled 2022-12-19 – 2022-12-31 (×2): qty 180, 23d supply, fill #0
  Filled 2023-01-15 – 2023-01-19 (×2): qty 180, 23d supply, fill #1

## 2022-12-19 MED ORDER — FOLIC ACID 1 MG PO TABS
1.0000 mg | ORAL_TABLET | Freq: Every day | ORAL | 2 refills | Status: DC
  Filled 2022-12-19: qty 100, 100d supply, fill #0
  Filled 2023-01-13: qty 30, 30d supply, fill #0
  Filled 2023-02-12 (×2): qty 30, 30d supply, fill #1
  Filled 2023-03-20 (×2): qty 30, 30d supply, fill #2
  Filled 2023-04-26 – 2023-04-27 (×2): qty 30, 30d supply, fill #3
  Filled 2023-05-22: qty 30, 30d supply, fill #4
  Filled 2023-07-03: qty 30, 30d supply, fill #5
  Filled 2023-08-03: qty 30, 30d supply, fill #6
  Filled 2023-09-18: qty 30, 30d supply, fill #7

## 2022-12-19 MED ORDER — MOMETASONE FURO-FORMOTEROL FUM 100-5 MCG/ACT IN AERO
2.0000 | INHALATION_SPRAY | Freq: Two times a day (BID) | RESPIRATORY_TRACT | 6 refills | Status: DC
  Filled 2022-12-19 – 2022-12-31 (×2): qty 13, 30d supply, fill #0
  Filled 2023-01-26: qty 13, 30d supply, fill #1
  Filled 2023-02-27 – 2023-03-06 (×3): qty 13, 30d supply, fill #2
  Filled 2023-04-06 – 2023-04-17 (×2): qty 13, 30d supply, fill #3

## 2022-12-19 MED ORDER — PANTOPRAZOLE SODIUM 40 MG PO TBEC
40.0000 mg | DELAYED_RELEASE_TABLET | Freq: Every day | ORAL | 2 refills | Status: DC
  Filled 2022-12-19 – 2023-01-13 (×2): qty 60, 60d supply, fill #0
  Filled 2023-03-20: qty 60, 60d supply, fill #1
  Filled 2023-03-20: qty 30, 30d supply, fill #1
  Filled 2023-04-26: qty 30, 30d supply, fill #2
  Filled 2023-05-22: qty 30, 30d supply, fill #3
  Filled 2023-06-21 – 2023-07-03 (×2): qty 30, 30d supply, fill #4

## 2022-12-19 NOTE — Patient Instructions (Addendum)
No change in medications  We have recommended alcohol rehabilitation as an inpatient along with detox  The best program is [facility] in [place] they will do detox and rehab  Below is the program information and Dylan Snyder our social worker will be in touch with you today  I can give you refills on all medications so that you can take them with you and also can give you nicotine patch and lozenges that you can decrease your smoking which they will want     [email or web address] -[address] [place], [place] [phone] or [phone] 24hrs a day. 24hours they are open for walk-ins. Free for those who do not have insurance. Patient would need to bring any medications they may be taking with them.

## 2022-12-19 NOTE — Assessment & Plan Note (Signed)
Progressive cirrhosis will occur if alcohol use continues

## 2022-12-19 NOTE — Assessment & Plan Note (Signed)
I had an extended conversation lasting 20 minutes with the patient and spouse I recommended the patient immediately go to detox and inpatient rehab he is willing to give this consideration  I called our social worker Reginia Naas who will call him to make follow-up arrangements

## 2022-12-19 NOTE — Assessment & Plan Note (Signed)
Difficult to control with alcohol use will continue glipizide

## 2022-12-19 NOTE — Assessment & Plan Note (Signed)
Resolved

## 2022-12-19 NOTE — Assessment & Plan Note (Signed)
Continue anticoagulation however as time goes on this become problematic with his liver disease

## 2022-12-19 NOTE — Telephone Encounter (Signed)
Please call this patient today he needs to go into alcohol rehab and be detoxed I met with him today with his wife the family is at the end of their tolerance and may want him to leave the house he is still drinking 12 beers a day and multiple shots of liquor he has alcohol cirrhosis severe neuropathy and high risk of death from Warnicke's Korsakoff   The patient agrees to receive your call I also reached out to you because I know you are at [employer] today by phone

## 2022-12-19 NOTE — Telephone Encounter (Signed)
Update: Called pt about 10 mins after talking to you. He was downstairs at the pharmacy. He said "he has not had a drink since last weekend, he has a case of beer at home he hasn't touched, he's not sure about going to any facility". I stressed that this was a matter of life and death, as well as saving his relationship with is wife and kid. He said he understood and hung up on me. I called him back with no answer and I also called the wife twice, no answer.

## 2022-12-19 NOTE — Assessment & Plan Note (Signed)
Hypertension not well-controlled at this point alcohol use is driving this will not make changes in medications.

## 2022-12-20 ENCOUNTER — Ambulatory Visit: Payer: Medicaid Other | Admitting: Critical Care Medicine

## 2022-12-21 ENCOUNTER — Other Ambulatory Visit: Payer: Self-pay

## 2022-12-26 ENCOUNTER — Other Ambulatory Visit: Payer: Self-pay

## 2023-01-01 ENCOUNTER — Other Ambulatory Visit: Payer: Self-pay

## 2023-01-03 ENCOUNTER — Other Ambulatory Visit: Payer: Self-pay

## 2023-01-03 ENCOUNTER — Encounter: Payer: Self-pay | Admitting: Gastroenterology

## 2023-01-03 ENCOUNTER — Telehealth: Payer: Self-pay

## 2023-01-03 ENCOUNTER — Ambulatory Visit (INDEPENDENT_AMBULATORY_CARE_PROVIDER_SITE_OTHER): Payer: Medicaid Other | Admitting: Gastroenterology

## 2023-01-03 ENCOUNTER — Other Ambulatory Visit (INDEPENDENT_AMBULATORY_CARE_PROVIDER_SITE_OTHER): Payer: Medicaid Other

## 2023-01-03 VITALS — BP 138/86 | HR 67 | Ht 74.0 in | Wt 265.4 lb

## 2023-01-03 DIAGNOSIS — R6881 Early satiety: Secondary | ICD-10-CM | POA: Diagnosis not present

## 2023-01-03 DIAGNOSIS — K7030 Alcoholic cirrhosis of liver without ascites: Secondary | ICD-10-CM | POA: Diagnosis not present

## 2023-01-03 DIAGNOSIS — F1010 Alcohol abuse, uncomplicated: Secondary | ICD-10-CM

## 2023-01-03 DIAGNOSIS — F1096 Alcohol use, unspecified with alcohol-induced persisting amnestic disorder: Secondary | ICD-10-CM | POA: Diagnosis not present

## 2023-01-03 DIAGNOSIS — Z86010 Personal history of colonic polyps: Secondary | ICD-10-CM

## 2023-01-03 LAB — GAMMA GT: GGT: 75 U/L — ABNORMAL HIGH (ref 7–51)

## 2023-01-03 LAB — FOLATE: Folate: >23.9 ng/mL (ref >5.9)

## 2023-01-03 LAB — PROTIME-INR
INR: 1.6 ratio — ABNORMAL HIGH (ref 0.8–1.0)
Prothrombin Time: 16.2 s — ABNORMAL HIGH (ref 9.6–13.1)

## 2023-01-03 LAB — IBC + FERRITIN
Ferritin: 57 ng/mL (ref 22.0–322.0)
Iron: 89 ug/dL (ref 42–165)
Saturation Ratios: 19.8 % — ABNORMAL LOW (ref 20.0–50.0)
TIBC: 449.4 ug/dL (ref 250.0–450.0)
Transferrin: 321 mg/dL (ref 212.0–360.0)

## 2023-01-03 MED ORDER — NA SULFATE-K SULFATE-MG SULF 17.5-3.13-1.6 GM/177ML PO SOLN
1.0000 | Freq: Once | ORAL | 0 refills | Status: AC
  Filled 2023-01-03: qty 354, 1d supply, fill #0

## 2023-01-03 NOTE — Telephone Encounter (Signed)
You may hold Xarelto 2 days prior to the procedure and then resume if cleared by gastroenterology based on the endoscopy colonoscopy results

## 2023-01-03 NOTE — Patient Instructions (Signed)
_______________________________________________________  If your blood pressure at your visit was 140/90 or greater, please contact your primary care physician to follow up on this.  _______________________________________________________  If you are age 51 or older, your body mass index should be between 23-30. Your Body mass index is 34.07 kg/m. If this is out of the aforementioned range listed, please consider follow up with your Primary Care Provider.  If you are age 50 or younger, your body mass index should be between 19-25. Your Body mass index is 34.07 kg/m. If this is out of the aformentioned range listed, please consider follow up with your Primary Care Provider.   ________________________________________________________  The [facility] providers would like to encourage you to use [facility] to communicate with providers for non-urgent requests or questions.  Due to long hold times on the telephone, sending your provider a message by [facility] may be a faster and more efficient way to get a response.  Please allow 48 business hours for a response.  Please remember that this is for non-urgent requests.  _______________________________________________________   Your provider has requested that you go to the basement level for lab work before leaving today. Press "B" on the elevator. The lab is located at the first door on the left as you exit the elevator.  You have been scheduled for an endoscopy and colonoscopy. Please follow the written instructions given to you at your visit today. Please pick up your prep supplies at the pharmacy within the next 1-3 days. If you use inhalers (even only as needed), please bring them with you on the day of your procedure.  Please call us Friday regarding blood thinner clearance.  You have been scheduled for an abdominal ultrasound at [facility]iology (1st floor of hospital) on 01-05-2023 at 1130am. Please arrive 30 minutes prior to your  appointment for registration. Make certain not to have anything to eat or drink midnight prior to your appointment. Should you need to reschedule your appointment, please contact radiology at [phone]. This test typically takes about 30 minutes to perform.  Thank you for trusting me with your gastrointestinal care!
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
## 2023-01-03 NOTE — Telephone Encounter (Signed)
Dr Delford Field  This patient has an EGD/Colonoscopy on 01-11-2023 and we are asking if this patient can hold his Xarelto 2 days prior to the procedure since you are his prescribing doctor. Please respond as soon as your can since this procedure is next week. Thank you   [facility] Pre-operative Risk Assessment     Request for surgical clearance:     Endoscopy Procedure  What type of surgery is being performed?     EGD/Colon  When is this surgery scheduled?     01-11-2023  What type of clearance is required ?   Pharmacy  Are there any medications that need to be held prior to surgery and how long? Xarelto 2 days prior  Practice name and name of physician performing surgery?      [facility]  What is your office phone and fax number?      Phone- [phone]  Fax- [phone]  Anesthesia type (None, local, MAC, general) ?       MAC

## 2023-01-04 LAB — CERULOPLASMIN: Ceruloplasmin: 32 mg/dL — ABNORMAL HIGH (ref 14–30)

## 2023-01-04 NOTE — Telephone Encounter (Signed)
Patient made aware to hold it the day before and the day of the procedure and voiced understanding

## 2023-01-05 ENCOUNTER — Ambulatory Visit (HOSPITAL_COMMUNITY): Payer: Medicaid Other

## 2023-01-08 LAB — IGG: IgG (Immunoglobin G), Serum: 1499 mg/dL (ref 600–1640)

## 2023-01-08 LAB — MITOCHONDRIAL ANTIBODIES: Mitochondrial M2 Ab, IgG: <=20 U (ref <=20.0)

## 2023-01-08 LAB — ALPHA-1-ANTITRYPSIN: A-1 Antitrypsin, Ser: 161 mg/dL (ref 83–199)

## 2023-01-08 LAB — ANTI-SMOOTH MUSCLE ANTIBODY, IGG: Actin (Smooth Muscle) Antibody (IGG): <20 U (ref <20)

## 2023-01-08 LAB — VITAMIN B12: Vitamin B-12: 479 pg/mL (ref 200–1100)

## 2023-01-10 NOTE — Progress Notes (Signed)
Dylan Snyder,  Your labs were most notable for an elevated INR.  This is a measure of the body's ability to clot blood.  When the INR is elevated, this indicates impairment in the body's ability to clot blood.  As the clotting proteins are made in the liver, an elevated INR can indicate impaired liver functioning.  However, you are on a blood thinner.  This medication can also affect the INR in unpredictable ways.  Therefore it is difficult to know how much of the INR abnormality is from the liver and how much is from the medication. Your GGT was elevated.  This is an indication of liver inflammation that is more specific for alcohol-induced liver injury.  The other tests looking for other causes of chronic liver disease were unremarkable.  We will have more information following your elastography next week.

## 2023-01-11 ENCOUNTER — Ambulatory Visit (AMBULATORY_SURGERY_CENTER): Payer: Medicaid Other | Admitting: Gastroenterology

## 2023-01-11 ENCOUNTER — Encounter: Payer: Self-pay | Admitting: Gastroenterology

## 2023-01-11 VITALS — BP 155/99 | HR 74 | Temp 98.7°F | Resp 18 | Ht 74.0 in | Wt 265.0 lb

## 2023-01-11 DIAGNOSIS — K2950 Unspecified chronic gastritis without bleeding: Secondary | ICD-10-CM | POA: Diagnosis not present

## 2023-01-11 DIAGNOSIS — K7460 Unspecified cirrhosis of liver: Secondary | ICD-10-CM

## 2023-01-11 DIAGNOSIS — Z09 Encounter for follow-up examination after completed treatment for conditions other than malignant neoplasm: Secondary | ICD-10-CM

## 2023-01-11 DIAGNOSIS — D123 Benign neoplasm of transverse colon: Secondary | ICD-10-CM

## 2023-01-11 DIAGNOSIS — D128 Benign neoplasm of rectum: Secondary | ICD-10-CM

## 2023-01-11 DIAGNOSIS — Z86010 Personal history of colonic polyps: Secondary | ICD-10-CM | POA: Diagnosis not present

## 2023-01-11 DIAGNOSIS — K7030 Alcoholic cirrhosis of liver without ascites: Secondary | ICD-10-CM

## 2023-01-11 MED ORDER — SODIUM CHLORIDE 0.9 % IV SOLN
500.0000 mL | INTRAVENOUS | Status: DC
Start: 2023-01-11 — End: 2023-01-11

## 2023-01-11 NOTE — Patient Instructions (Addendum)
Handouts on polyps and diverticulosis given to patient Await pathology results Resume previous diet and continue present medications - resume Eliquis (apixaban) tomorrow 01/12/23 at previous dose  Repeat colonoscopy for surveillance will be determined based off of pathology results   YOU HAD AN ENDOSCOPIC PROCEDURE TODAY AT THE [facility]:   Refer to the procedure report that was given to you for any specific questions about what was found during the examination.  If the procedure report does not answer your questions, please call your gastroenterologist to clarify.  If you requested that your care partner not be given the details of your procedure findings, then the procedure report has been included in a sealed envelope for you to review at your convenience later.  YOU SHOULD EXPECT: Some feelings of bloating in the abdomen. Passage of more gas than usual.  Walking can help get rid of the air that was put into your GI tract during the procedure and reduce the bloating. If you had a lower endoscopy (such as a colonoscopy or flexible sigmoidoscopy) you may notice spotting of blood in your stool or on the toilet paper. If you underwent a bowel prep for your procedure, you may not have a normal bowel movement for a few days.  Please Note:  You might notice some irritation and congestion in your nose or some drainage.  This is from the oxygen used during your procedure.  There is no need for concern and it should clear up in a day or so.  SYMPTOMS TO REPORT IMMEDIATELY:  Following lower endoscopy (colonoscopy or flexible sigmoidoscopy):  Excessive amounts of blood in the stool  Significant tenderness or worsening of abdominal pains  Swelling of the abdomen that is new, acute  Fever of 100F or higher  Following upper endoscopy (EGD)  Vomiting of blood or coffee ground material  New chest pain or pain under the shoulder blades  Painful or persistently difficult swallowing  New  shortness of breath  Fever of 100F or higher  Black, tarry-looking stools  For urgent or emergent issues, a gastroenterologist can be reached at any hour by calling [phone]. Do not use MyChart messaging for urgent concerns.    DIET:  We do recommend a small meal at first, but then you may proceed to your regular diet.  Drink plenty of fluids but you should avoid alcoholic beverages for 24 hours.  ACTIVITY:  You should plan to take it easy for the rest of today and you should NOT DRIVE or use heavy machinery until tomorrow (because of the sedation medicines used during the test).    FOLLOW UP: Our staff will call the number listed on your records the next business day following your procedure.  We will call around 7:15- 8:00 am to check on you and address any questions or concerns that you may have regarding the information given to you following your procedure. If we do not reach you, we will leave a message.     If any biopsies were taken you will be contacted by phone or by letter within the next 1-3 weeks.  Please call us at [phone] if you have not heard about the biopsies in 3 weeks.    SIGNATURES/CONFIDENTIALITY: You and/or your care partner have signed paperwork which will be entered into your electronic medical record.  These signatures attest to the fact that that the information above on your After Visit Summary has been reviewed and is understood.  Full responsibility of the  confidentiality of this discharge information lies with you and/or your care-partner.

## 2023-01-11 NOTE — Progress Notes (Signed)
Vss nad trans to pacu 

## 2023-01-11 NOTE — Op Note (Signed)
[facility] Patient Name: Dylan Snyder Procedure Date: 01/11/2023 3:02 PM MRN: [id number] Endoscopist: Dylan Snyder , MD, [id number] Age: 52 Referring MD:  Date of Birth: 04-12-1971 Gender: Male Account #: [id number] Procedure:                Upper GI endoscopy Indications:              Cirrhosis rule out esophageal varices, Abdominal                            bloating, Early satiety Medicines:                Monitored Anesthesia Care Procedure:                Pre-Anesthesia Assessment:                           - Prior to the procedure, a History and Physical                            was performed, and patient medications and                            allergies were reviewed. The patient's tolerance of                            previous anesthesia was also reviewed. The risks                            and benefits of the procedure and the sedation                            options and risks were discussed with the patient.                            All questions were answered, and informed consent                            was obtained. Prior Anticoagulants: The patient has                            taken Eliquis (apixaban), last dose was 2 days                            prior to procedure. ASA Grade Assessment: II - A                            patient with mild systemic disease. After reviewing                            the risks and benefits, the patient was deemed in                            satisfactory condition to undergo the procedure.  After obtaining informed consent, the endoscope was                            passed under direct vision. Throughout the                            procedure, the patient's blood pressure, pulse, and                            oxygen saturations were monitored continuously. The                            Olympus Scope [id number] was introduced through the                            mouth,  and advanced to the second part of duodenum.                            The upper GI endoscopy was accomplished without                            difficulty. The patient tolerated the procedure                            well. Scope In: Scope Out: Findings:                 The examined portions of the nasopharynx,                            oropharynx and larynx were normal.                           The examined esophagus was normal.                           The entire examined stomach was normal. Biopsies                            were taken with a cold forceps for Helicobacter                            pylori testing. Estimated blood loss was minimal.                           The examined duodenum was normal. Complications:            No immediate complications. Estimated Blood Loss:     Estimated blood loss was minimal. Impression:               - The examined portions of the nasopharynx,                            oropharynx and larynx were normal.                           -  Normal esophagus. No evidence of esophageal                            varices                           - Normal stomach. Biopsied.                           - Normal examined duodenum.                           - No abnormalities to explain patient's symptoms of                            bloating and early satiety. Recommendation:           - Patient has a contact number available for                            emergencies. The signs and symptoms of potential                            delayed complications were discussed with the                            patient. Return to normal activities tomorrow.                            Written discharge instructions were provided to the                            patient.                           - Resume previous diet.                           - Continue present medications.                           - Resume Eliquis (apixaban) at prior dose tomorrow.                            - Await pathology results. Dylan Hiltz E. Tomasa Rand, MD 01/11/2023 4:02:44 PM This report has been signed electronically.

## 2023-01-11 NOTE — Progress Notes (Signed)
History and Physical Interval Note:  01/11/2023 3:17 PM  Dylan Snyder  has presented today for endoscopic procedure(s), with the diagnosis of  Encounter Diagnoses  Name Primary?   Alcoholic cirrhosis of liver without ascites (HCC) Yes   History of colonic polyps   .  The various methods of evaluation and treatment have been discussed with the patient and/or family. After consideration of risks, benefits and other options for treatment, the patient has consented to  the endoscopic procedure(s).   The patient's history has been reviewed, patient examined, no change in status, stable for endoscopic procedure(s).  I have reviewed the patient's chart and labs.  Questions were answered to the patient's satisfaction.     Yasin Ducat E. Tomasa Rand, MD [facility]

## 2023-01-11 NOTE — Op Note (Signed)
[facility] Patient Name: Dylan Snyder Procedure Date: 01/11/2023 3:02 PM MRN: [id number] Endoscopist: Lorin Picket E. Tomasa Rand , MD, [id number] Age: 52 Referring MD:  Date of Birth: August 29, 1970 Gender: Male Account #: [id number] Procedure:                Colonoscopy Indications:              Surveillance: Personal history of colonic polyps                            (unknown histology) on last colonoscopy 3 years ago Medicines:                Monitored Anesthesia Care Procedure:                Pre-Anesthesia Assessment:                           - Prior to the procedure, a History and Physical                            was performed, and patient medications and                            allergies were reviewed. The patient's tolerance of                            previous anesthesia was also reviewed. The risks                            and benefits of the procedure and the sedation                            options and risks were discussed with the patient.                            All questions were answered, and informed consent                            was obtained. Prior Anticoagulants: The patient has                            taken Eliquis (apixaban), last dose was 2 days                            prior to procedure. ASA Grade Assessment: II - A                            patient with mild systemic disease. After reviewing                            the risks and benefits, the patient was deemed in                            satisfactory condition to undergo the procedure.  After obtaining informed consent, the colonoscope                            was passed under direct vision. Throughout the                            procedure, the patient's blood pressure, pulse, and                            oxygen saturations were monitored continuously. The                            CF [id number] #[id number] was introduced through the anus                             and advanced to the the cecum, identified by                            appendiceal orifice and ileocecal valve. The                            colonoscopy was performed without difficulty. The                            patient tolerated the procedure well. The quality                            of the bowel preparation was good. The ileocecal                            valve, appendiceal orifice, and rectum were                            photographed. The bowel preparation used was SUPREP                            via split dose instruction. Scope In: 3:36:41 PM Scope Out: 3:54:20 PM Scope Withdrawal Time: 0 hours 12 minutes 50 seconds  Total Procedure Duration: 0 hours 17 minutes 39 seconds  Findings:                 Skin tags were found on perianal exam.                           The digital rectal exam was normal. Pertinent                            negatives include normal sphincter tone and no                            palpable rectal lesions.                           A 3 mm polyp was found in the transverse colon. The  polyp was sessile. The polyp was removed with a                            cold snare. Resection and retrieval were complete.                            Estimated blood loss was minimal.                           A 2 mm polyp was found in the splenic flexure. The                            polyp was sessile. The polyp was removed with a                            cold snare. Resection and retrieval were complete.                            Estimated blood loss was minimal.                           A 3 mm polyp was found in the rectum. The polyp was                            sessile. The polyp was removed with a cold snare.                            Resection and retrieval were complete. Estimated                            blood loss was minimal.                           There was evidence of a prior end-to-side                             colo-colonic anastomosis at 30 cm proximal to the                            anus. This was patent and was characterized by                            healthy appearing mucosa.                           A tattoo was seen at 25 cm proximal to the anus.                            The tattoo site appeared normal.                           Multiple small-mouthed diverticula were found in  the sigmoid colon.                           No other significant abnormalities were identified                            in a careful examination of the remainder of the                            colon.                           The retroflexed view of the distal rectum and anal                            verge was normal and showed no anal or rectal                            abnormalities. Complications:            No immediate complications. Estimated Blood Loss:     Estimated blood loss was minimal. Impression:               - Perianal skin tags found on perianal exam.                           - One 3 mm polyp in the transverse colon, removed                            with a cold snare. Resected and retrieved.                           - One 2 mm polyp at the splenic flexure, removed                            with a cold snare. Resected and retrieved.                           - One 3 mm polyp in the rectum, removed with a cold                            snare. Resected and retrieved.                           - Patent end-to-side colo-colonic anastomosis,                            characterized by healthy appearing mucosa.                           - A tattoo was seen at 25 cm proximal to the anus.                            The tattoo site appeared normal.                           -  Diverticulosis in the sigmoid colon.                           - The distal rectum and anal verge are normal on                            retroflexion  view. Recommendation:           - Patient has a contact number available for                            emergencies. The signs and symptoms of potential                            delayed complications were discussed with the                            patient. Return to normal activities tomorrow.                            Written discharge instructions were provided to the                            patient.                           - Resume previous diet.                           - Continue present medications.                           - Await pathology results.                           - Repeat colonoscopy (date not yet determined) for                            surveillance based on pathology results.                           - Resume Eliquis (apixaban) at prior dose tomorrow. Analicia Skibinski E. Tomasa Rand, MD 01/11/2023 4:11:42 PM This report has been signed electronically.

## 2023-01-12 ENCOUNTER — Telehealth: Payer: Self-pay | Admitting: *Deleted

## 2023-01-12 NOTE — Telephone Encounter (Signed)
  Follow up Call-     01/11/2023    3:12 PM  Call back number  Post procedure Call Back phone  # [phone]  Permission to leave phone message Yes     Patient questions:   Message left to call if necessary.

## 2023-01-15 ENCOUNTER — Other Ambulatory Visit: Payer: Self-pay

## 2023-01-16 ENCOUNTER — Ambulatory Visit (HOSPITAL_COMMUNITY)
Admission: RE | Admit: 2023-01-16 | Discharge: 2023-01-16 | Disposition: A | Discharge status: Home / Self Care | Payer: Medicaid Other | Source: Ambulatory Visit | Attending: Gastroenterology | Admitting: Gastroenterology

## 2023-01-16 ENCOUNTER — Other Ambulatory Visit: Payer: Self-pay

## 2023-01-16 DIAGNOSIS — Z86010 Personal history of colonic polyps: Secondary | ICD-10-CM | POA: Insufficient documentation

## 2023-01-16 DIAGNOSIS — K7030 Alcoholic cirrhosis of liver without ascites: Secondary | ICD-10-CM | POA: Insufficient documentation

## 2023-01-17 NOTE — Progress Notes (Signed)
Dylan Snyder,  The biopsies taken from your stomach were notable for mild chronic gastritis (inflammation) which is a common finding, but there was no evidence of Helicobacter pylori infection. This common finding is not likely to explain any symptoms and there is no specific treatment or further evaluation recommended. The three polyps which I removed during your recent colonoscopy were proven to be completely benign but are considered "pre-cancerous" polyps that MAY have grown into cancer if they had not been removed.  Studies shows that at least 20% of women over age 30 and 30% of men over age 50 have pre-cancerous polyps.  Based on current nationally recognized surveillance guidelines, I recommend that you have a repeat colonoscopy in 5 years.   If you develop any new rectal bleeding, abdominal pain or significant bowel habit changes, please contact me before then.

## 2023-01-18 ENCOUNTER — Encounter: Payer: Self-pay | Admitting: Gastroenterology

## 2023-01-19 ENCOUNTER — Other Ambulatory Visit: Payer: Self-pay

## 2023-01-19 ENCOUNTER — Other Ambulatory Visit: Payer: Self-pay | Admitting: Critical Care Medicine

## 2023-01-19 ENCOUNTER — Encounter: Payer: Self-pay | Admitting: Critical Care Medicine

## 2023-01-19 MED ORDER — ALBUTEROL SULFATE HFA 108 (90 BASE) MCG/ACT IN AERS
2.0000 | INHALATION_SPRAY | Freq: Four times a day (QID) | RESPIRATORY_TRACT | 1 refills | Status: AC | PRN
  Filled 2023-01-19: qty 18, 25d supply, fill #0

## 2023-01-21 MED ORDER — GABAPENTIN 400 MG PO CAPS
800.0000 mg | ORAL_CAPSULE | Freq: Four times a day (QID) | ORAL | 2 refills | Status: DC
  Filled 2023-01-21 – 2023-02-12 (×4): qty 180, 23d supply, fill #0
  Filled 2023-02-27 – 2023-03-06 (×3): qty 180, 23d supply, fill #1
  Filled 2023-03-20 – 2023-03-26 (×2): qty 180, 23d supply, fill #2
  Filled ????-??-??: fill #2

## 2023-01-22 ENCOUNTER — Other Ambulatory Visit: Payer: Self-pay

## 2023-01-22 NOTE — Progress Notes (Signed)
Mr. Isidore,  Your elastography did not demonstrate the presence of advanced scarring to suggest cirrhosis.  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   This is good news and means that your liver has the chance to heal and normalize with alcohol cessation. Continued alcohol will use likely lead to further damage and eventual cirrhosis.  Please let us know if we can help you with achieving abstinence.

## 2023-01-26 ENCOUNTER — Other Ambulatory Visit: Payer: Self-pay

## 2023-01-31 ENCOUNTER — Other Ambulatory Visit: Payer: Self-pay

## 2023-02-06 ENCOUNTER — Ambulatory Visit: Payer: Medicaid Other | Admitting: Critical Care Medicine

## 2023-02-12 ENCOUNTER — Other Ambulatory Visit: Payer: Self-pay

## 2023-02-21 ENCOUNTER — Encounter: Payer: Self-pay | Admitting: Gastroenterology

## 2023-02-21 ENCOUNTER — Telehealth: Payer: Self-pay | Admitting: *Deleted

## 2023-02-21 ENCOUNTER — Encounter: Payer: Self-pay | Admitting: Critical Care Medicine

## 2023-02-21 NOTE — Telephone Encounter (Signed)
Called patient, informed that his wife called in reference to the liver scan done on 6/'24 and was concerned that the scan showed the patient had no sign of cirrhosis, after having multiple scans prior, showing he did have cirrhosis.Wife stated this scan showed that the patient did not have cirrhosis. Patient answered the phone and informed the nurse that he was aware that he has cirrhosis of the liver and was not sure why his wife called stating he did not have cirrhosis. Patient informed to call if any further questions. Patient understood and agreed.

## 2023-02-27 ENCOUNTER — Other Ambulatory Visit: Payer: Self-pay

## 2023-03-06 ENCOUNTER — Other Ambulatory Visit: Payer: Self-pay

## 2023-03-14 ENCOUNTER — Other Ambulatory Visit: Payer: Self-pay

## 2023-03-20 ENCOUNTER — Other Ambulatory Visit: Payer: Self-pay

## 2023-03-23 ENCOUNTER — Other Ambulatory Visit: Payer: Self-pay

## 2023-03-27 ENCOUNTER — Other Ambulatory Visit: Payer: Self-pay

## 2023-04-06 ENCOUNTER — Other Ambulatory Visit: Payer: Self-pay | Admitting: Critical Care Medicine

## 2023-04-09 ENCOUNTER — Other Ambulatory Visit: Payer: Self-pay

## 2023-04-09 MED ORDER — GABAPENTIN 400 MG PO CAPS
800.0000 mg | ORAL_CAPSULE | Freq: Four times a day (QID) | ORAL | 2 refills | Status: DC
  Filled 2023-04-09 – 2023-04-17 (×3): qty 180, 23d supply, fill #0
  Filled 2023-05-10: qty 180, 23d supply, fill #1
  Filled 2023-05-30 (×3): qty 180, 23d supply, fill #2

## 2023-04-13 ENCOUNTER — Other Ambulatory Visit: Payer: Self-pay

## 2023-04-17 ENCOUNTER — Other Ambulatory Visit: Payer: Self-pay

## 2023-04-25 ENCOUNTER — Ambulatory Visit: Payer: Medicaid Other | Admitting: Family Medicine

## 2023-04-26 ENCOUNTER — Other Ambulatory Visit: Payer: Self-pay

## 2023-04-27 ENCOUNTER — Other Ambulatory Visit: Payer: Self-pay

## 2023-05-10 ENCOUNTER — Other Ambulatory Visit: Payer: Self-pay

## 2023-05-11 ENCOUNTER — Telehealth: Payer: Self-pay | Admitting: Family Medicine

## 2023-05-11 ENCOUNTER — Telehealth: Payer: Medicaid Other | Admitting: Family Medicine

## 2023-05-11 ENCOUNTER — Ambulatory Visit: Payer: Self-pay

## 2023-05-11 ENCOUNTER — Ambulatory Visit: Payer: Medicaid Other | Admitting: *Deleted

## 2023-05-11 DIAGNOSIS — Z91199 Patient's noncompliance with other medical treatment and regimen due to unspecified reason: Secondary | ICD-10-CM

## 2023-05-11 NOTE — Telephone Encounter (Signed)
Spoke with patient's  Wife . Patient not available. Patient wife reports that patient has used a needle and has drawn off a lot of fluid from his elbow. Advised patient to go to urgent care or ED . Cautioned on the results of not seeking medical attention. Patient wife voiced that she was going to get him a they would got to the ED.

## 2023-05-11 NOTE — Telephone Encounter (Signed)
   Chief Complaint: Swollen right elbow Symptoms: above Frequency: 1.5 weeks Pertinent Negatives: Patient denies infection Disposition: [] ED /[] Urgent Care (no appt availability in office) / [] Appointment(In office/virtual)/ [x]  [place] [facility]/ [] Home Care/ [] Refused Recommended Disposition /[] [place] [facility]/ []  Follow-up with PCP Additional Notes: Pt fell 1.5 weeks ago. Pt now has a knot on his elbow that has fluid in it. Pt has removed the fluid himself a few times and is considering lancing the knot. No appts in office available. Pt refuses UC. VV scheduled .  Reason for Disposition  MILD OR MODERATE joint swelling (e.g., feels or looks mildly swollen or puffy)  Answer Assessment - Initial Assessment Questions 1. LOCATION: "Where is the swelling?" (e.g., left, right, both elbows)     rt 2. SIZE and DESCRIPTION: "What does the swelling look like?" (e.g., entire elbow, localized)     Egg on outside 3. ONSET: "When did the swelling start?" "Does it come and go, or is it there all the time?"     Right after fall 4. SETTING: "Has there been any recent work, exercise or other activity that involved that part of the body?"      Fell on elbow 5. AGGRAVATING FACTORS: "What makes the elbow swelling worse?" (e.g., work, sports activities)     no 6. ASSOCIATED SYMPTOMS: "Is there any pain or redness?"     *No Answer* 7. OTHER SYMPTOMS: "Do you have any other symptoms?" (e.g., fever)     *No Answer*  Protocols used: Elbow Swelling-A-AH

## 2023-05-11 NOTE — Telephone Encounter (Signed)
  Chief Complaint: fell , right  elbow swelling , possible hematoma. Patient takes xarelto  Symptoms: fell last week going to refrigerator loss of balance. Fell hitting right elbow lemon size swelling. Skin peeling off. Patient takes xarelto .  Can use right arm without issues. Frequency: last week  Pertinent Negatives: Patient denies chest pain, no difficulty breathing. No dizziness.No bleeding no redness or warm to touch at right elbow  Disposition: [] ED /[] Urgent Care (no appt availability in office) / [] Appointment(In office/virtual)/ []  [place] [facility]/ [] Home Care/ [x] Refused Recommended Disposition /[]  Mobile Bus/ []  Follow-up with PCP Additional Notes:   No available appt until 05/26/23. Recommended UC / ED and patient declined. Patient reports he wants to "lance"  swelling area. Instructed patient do not lance area.  Please advise regarding appt.      Reason for Disposition  [1] Caller has URGENT question AND [2] triager unable to answer question  Answer Assessment - Initial Assessment Questions 1. MECHANISM: "How did the fall happen?"     Lost balance and fell going to refrigerator  2. DOMESTIC VIOLENCE AND ELDER ABUSE SCREENING: "Did you fall because someone pushed you or tried to hurt you?" If Yes, ask: "Are you safe now?"     na 3. ONSET: "When did the fall happen?" (e.g., minutes, hours, or days ago)     Last week  4. LOCATION: "What part of the body hit the ground?" (e.g., back, buttocks, head, hips, knees, hands, head, stomach)     Right elbow 5. INJURY: "Did you hurt (injure) yourself when you fell?" If Yes, ask: "What did you injure? Tell me more about this?" (e.g., body area; type of injury; pain severity)"     Right elbow swelling  6. PAIN: "Is there any pain?" If Yes, ask: "How bad is the pain?" (e.g., Scale 1-10; or mild,  moderate, severe)   - NONE (0): No pain   - MILD (1-3): Doesn't interfere with normal activities    - MODERATE (4-7):  Interferes with normal activities or awakens from sleep    - SEVERE (8-10): Excruciating pain, unable to do any normal activities      none 7. SIZE: For cuts, bruises, or swelling, ask: "How large is it?" (e.g., inches or centimeters)      Swelling right elbow size of  lemon 8. PREGNANCY: "Is there any chance you are pregnant?" "When was your last menstrual period?"     na 9. OTHER SYMPTOMS: "Do you have any other symptoms?" (e.g., dizziness, fever, weakness; new onset or worsening).      Skin peeling off of elbow and swelling not discolored. Size of lemon possible hematoma patient takes xarelto  10. CAUSE: "What do you think caused the fall (or falling)?" (e.g., tripped, dizzy spell)       Loss of balance denies dizziness  Protocols used: Falls and [facility]

## 2023-05-11 NOTE — Telephone Encounter (Signed)
Copied from CRM [id number]. Topic: Appointment Scheduling - Scheduling Inquiry for Clinic >> May 11, 2023  3:06 PM Phill Myron wrote: Pt was disconnected from NP Georgana Curio and was trying to reconnect with her. Please advise

## 2023-05-11 NOTE — Progress Notes (Signed)
Pt was unable to connect for visit. I tried calling his phone number however he did not answer so I left a VM-DWB

## 2023-05-22 ENCOUNTER — Other Ambulatory Visit: Payer: Self-pay | Admitting: Critical Care Medicine

## 2023-05-22 ENCOUNTER — Other Ambulatory Visit: Payer: Self-pay

## 2023-05-23 ENCOUNTER — Other Ambulatory Visit: Payer: Self-pay

## 2023-05-23 MED ORDER — GLIPIZIDE 5 MG PO TABS
5.0000 mg | ORAL_TABLET | Freq: Two times a day (BID) | ORAL | 2 refills | Status: DC
  Filled 2023-05-23: qty 60, 30d supply, fill #0
  Filled 2023-07-03: qty 60, 30d supply, fill #1

## 2023-05-23 NOTE — Telephone Encounter (Signed)
Requested Prescriptions  Pending Prescriptions Disp Refills   glipiZIDE (GLUCOTROL) 5 MG tablet 60 tablet 2    Sig: Take 1 tablet (5 mg total) by mouth 2 (two) times daily before a meal.     Endocrinology:  Diabetes - Sulfonylureas Failed - 05/22/2023 10:15 AM      Failed - HBA1C is between 0 and 7.9 and within 180 days    Hgb A1c MFr Bld  Date Value Ref Range Status  09/14/2022 6.9 (H) 4.8 - 5.6 % Final    Comment:             Prediabetes: 5.7 - 6.4          Diabetes: >6.4          Glycemic control for adults with diabetes: <7.0          Failed - Cr in normal range and within 360 days    Creatinine, Ser  Date Value Ref Range Status  11/02/2022 0.68 (L) 0.76 - 1.27 mg/dL Final         Passed - Valid encounter within last 6 months    Recent Outpatient Visits           5 months ago Alcohol abuse   [facility] Storm Frisk, [facility]   6 months ago Cellulitis of right lower extremity   [place] Storm Frisk, MD   7 months ago Alcoholic cirrhosis of liver with ascites [facility])   [place] [facility] Storm Frisk, MD   8 months ago Alcoholic cirrhosis of liver with ascites [facility])   [place] [facility] Storm Frisk, MD   9 months ago Need for immunization against influenza   [facility] [place], Marzella Schlein, [place]       Future Appointments             In 2 months Terri Piedra, DO [place] [facility]

## 2023-05-30 ENCOUNTER — Other Ambulatory Visit: Payer: Self-pay

## 2023-05-31 ENCOUNTER — Other Ambulatory Visit: Payer: Self-pay

## 2023-06-16 ENCOUNTER — Other Ambulatory Visit: Payer: Self-pay | Admitting: Critical Care Medicine

## 2023-06-18 ENCOUNTER — Other Ambulatory Visit: Payer: Self-pay

## 2023-06-18 MED ORDER — GABAPENTIN 400 MG PO CAPS
800.0000 mg | ORAL_CAPSULE | Freq: Four times a day (QID) | ORAL | 0 refills | Status: DC
  Filled 2023-06-18 – 2023-06-21 (×3): qty 180, 23d supply, fill #0

## 2023-06-18 NOTE — Telephone Encounter (Signed)
Requested medication (s) are due for refill today:yes  Requested medication (s) are on the active medication list: yes  Last refill:  04/09/23 end date 06/25/23 #180 2 RF  Future visit scheduled:no  Notes to clinic:  there is an end date. Didn't reorder.   Requested Prescriptions  Pending Prescriptions Disp Refills   gabapentin (NEURONTIN) 400 MG capsule 180 capsule 2    Sig: Take 2 capsules (800 mg total) by mouth 4 (four) times daily.     Neurology: Anticonvulsants - gabapentin Failed - 06/16/2023 10:43 AM      Failed - Cr in normal range and within 360 days    Creatinine, Ser  Date Value Ref Range Status  11/02/2022 0.68 (L) 0.76 - 1.27 mg/dL Final         Passed - Completed PHQ-2 or PHQ-9 in the last 360 days      Passed - Valid encounter within last 12 months    Recent Outpatient Visits           6 months ago Alcohol abuse   [facility] [place]. [facility] Storm Frisk, MD   7 months ago Cellulitis of right lower extremity   [facility] [facility] [place]. [facility] Storm Frisk, MD   8 months ago Alcoholic cirrhosis of liver with ascites [facility])   [place] [facility] [place]. [facility] Storm Frisk, MD   9 months ago Alcoholic cirrhosis of liver with ascites [facility])   [place] [facility] [place]. [facility] Storm Frisk, MD   10 months ago Need for immunization against influenza   [place] [facility] [place]. [facility] [place], [place]zella Schlein, [place]       Future Appointments             In 1 month Onalee Hua Gregery Na, DO [facility]

## 2023-06-19 ENCOUNTER — Other Ambulatory Visit: Payer: Self-pay

## 2023-06-21 ENCOUNTER — Other Ambulatory Visit: Payer: Self-pay

## 2023-07-03 ENCOUNTER — Other Ambulatory Visit: Payer: Self-pay

## 2023-07-04 ENCOUNTER — Other Ambulatory Visit: Payer: Self-pay

## 2023-07-10 ENCOUNTER — Ambulatory Visit (INDEPENDENT_AMBULATORY_CARE_PROVIDER_SITE_OTHER): Payer: Medicaid Other | Admitting: Dermatology

## 2023-07-10 ENCOUNTER — Encounter: Payer: Self-pay | Admitting: Dermatology

## 2023-07-10 ENCOUNTER — Other Ambulatory Visit: Payer: Self-pay

## 2023-07-10 VITALS — BP 154/98 | HR 81

## 2023-07-10 DIAGNOSIS — L280 Lichen simplex chronicus: Secondary | ICD-10-CM

## 2023-07-10 DIAGNOSIS — Z872 Personal history of diseases of the skin and subcutaneous tissue: Secondary | ICD-10-CM

## 2023-07-10 DIAGNOSIS — L910 Hypertrophic scar: Secondary | ICD-10-CM

## 2023-07-10 DIAGNOSIS — L905 Scar conditions and fibrosis of skin: Secondary | ICD-10-CM | POA: Diagnosis not present

## 2023-07-10 MED ORDER — TRIAMCINOLONE ACETONIDE 10 MG/ML IJ SUSP
10.0000 mg | Freq: Once | INTRAMUSCULAR | Status: AC
  Administered 2023-07-10: 10 mg

## 2023-07-10 MED ORDER — MUPIROCIN 2 % EX OINT
1.0000 | TOPICAL_OINTMENT | Freq: Two times a day (BID) | CUTANEOUS | 1 refills | Status: AC
  Filled 2023-07-10: qty 22, 11d supply, fill #0
  Filled 2023-07-16: qty 22, 9d supply, fill #1

## 2023-07-10 NOTE — Progress Notes (Signed)
New Patient Visit   Subjective  Dylan Snyder is a 52 y.o. male who presents for the following: New Pt - Vasculitis - Wound  Patient states he  has vasculitis located at the abdomen and lower legs that he  would like to have examined. Patient reports the areas have been there for 1 year. He reports the areas are bothersome.Patient rates irritation 5 out of 10. He states that the areas have not spread. Patient reports he  has previously been treated for these areas Dr. Delford Field, PCP. Rx different creams but he is unsure of the names.  Patient denies Hx of bx. Patient reports family history of skin cancer(s) (father - melanoma).  Pt also has an opening at a previous surgery site on the abdomen that is opening up that he would to have looked at as well. 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   The following portions of the chart were reviewed this encounter and updated as appropriate: medications, allergies, medical history  Review of Systems:  No other skin or systemic complaints except as noted in HPI or Assessment and Plan.  Objective  Well appearing patient in no apparent distress; mood and affect are within normal limits.    A focused examination was performed of the following areas: lower right leg & abdomen   Relevant exam findings are noted in the Assessment and Plan.            Assessment & Plan   1. 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 A recent illness (cold or cough) was identified as a potential trigger. The areas have now healed, and the active vasculitis has resolved, leaving scarring.    - Plan:  Pt's labs in chart were reviewed in detail, autoimmune workup was negative for any significant findings  (negative ANA, C3, C4 both WNL) Hepatitis panel was negative,    Return for evaluation if red bumps reappear. Consider oral steroids or strong steroid cream for future flares. Advised to reduce alcohol consumption to improve liver function. Follow up as needed.  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 The condition is exacerbated by friction from the patient's hammer.  - Plan:  Apply mupirocin ointment to the affected area daily with scabbed wound daily for 4 weeks. Massage the ointment to break up collagen. Use a barrier method to protect the area from further mechanical irritation.  3. Keloid with Open Wound - Assessment: Patient has a thick hypertrophic scar (keloid) in the periumbilical area following surgery. The scar tissue is weaker than normal skin and has developed an open wound. - Plan: Administered intralesional injection of Kenalog 10, 0.3 cc. Apply mupirocin ointment to the wound daily. Cover the treated area with a Band-Aid or gauze. Follow up as needed for potential additional treatments.   PROCEDURE NOTE  Location: umbilical  Informed Consent: Discussed risks (infection, pain, bleeding, bruising, thinning of the skin, loss of skin pigment, lack of resolution, and recurrence of lesion) and benefits of the procedure, as well as the alternatives. Informed consent was obtained. Preparation: The area was prepared a standard fashion.  Anesthesia: n/a  Procedure Details: An intralesional injection was performed with Kenalog 10 mg/cc. 0.3 cc in total were injected.  Total number of injections: 5  Plan: The patient was instructed on post-op care. Recommend OTC analgesia as needed for pain.  No follow-ups on file.    Documentation: I have reviewed the above documentation for accuracy and  completeness, and I agree with the above.   I, Shirron Marcha Solders, CMA, am acting as scribe for [employer], DO.   Langston Reusing, DO

## 2023-07-10 NOTE — Patient Instructions (Addendum)
Hello Mr. Dyess,  Thank you for visiting my office today. Your dedication to addressing your dermatological issues and improving your health is greatly appreciated. Here is a summary of the key instructions from today's consultation:  - Medications Prescribed:   - Mupirocin Ointment: Apply a generous layer daily to the affected areas and cover with a Band-Aid or gauze. Use this ointment for one month.   - Kenalog 10 Injection: This was administered today for the keloid on your periumbilical area.  - Lifestyle Adjustments:   - Alcohol Consumption: Reduce intake to support liver function.   - Tool Pouch Position: Continue using the adjusted position to avoid further irritation.   - Exercise: Engage in regular activities to help mitigate stress.  - Follow-Up Care:   - Monitoring: Keep an eye on the treated areas for any signs of recurrence or worsening.   - Appointment: Make a follow-up appointment if red bumps or other symptoms reappear.   Please do not hesitate to reach out if you have any questions, or if the symptoms persist or worsen. We are here to support you on your path to recovery.  Warm regards,  Dr. [facility]    Important Information  Due to recent changes in healthcare laws, you may see results of your pathology and/or laboratory studies on MyChart before the doctors have had a chance to review them. We understand that in some cases there may be results that are confusing or concerning to you. Please understand that not all results are received at the same time and often the doctors may need to interpret multiple results in order to provide you with the best plan of care or course of treatment. Therefore, we ask that you please give [place] 2 business days to thoroughly review all your results before contacting the office for clarification. Should we see a critical lab result, you will be contacted sooner.   If You Need Anything After Your Visit  If you have  any questions or concerns for your doctor, please call our main line at [phone] If no one answers, please leave a voicemail as directed and we will return your call as soon as possible. Messages left after 4 pm will be answered the following business day.   You may also send [place] a message via MyChart. We typically respond to MyChart messages within 1-2 business days.  For prescription refills, please ask your pharmacy to contact our office. Our fax number is [phone].  If you have an urgent issue when the clinic is closed that cannot wait until the next business day, you can page your doctor at the number below.    Please note that while we do our best to be available for urgent issues outside of office hours, we are not available 24/7.   If you have an urgent issue and are unable to reach [place], you may choose to seek medical care at your doctor's office, retail clinic, urgent care center, or emergency room.  If you have a medical emergency, please immediately call 911 or go to the emergency department. In the event of inclement weather, please call our main line at [phone] for an update on the status of any delays or closures.  Dermatology Medication Tips: Please keep the boxes that topical medications come in in order to help keep track of the instructions about where and how to use these. Pharmacies typically print the medication instructions only on the boxes and not directly on the medication tubes.  If your medication is too expensive, please contact our office at [phone] or send [place] a message through MyChart.   We are unable to tell what your co-pay for medications will be in advance as this is different depending on your insurance coverage. However, we may be able to find a substitute medication at lower cost or fill out paperwork to get insurance to cover a needed medication.   If a prior authorization is required to get your medication covered by your insurance  company, please allow [place] 1-2 business days to complete this process.  Drug prices often vary depending on where the prescription is filled and some pharmacies may offer cheaper prices.  The website www.goodrx.com contains coupons for medications through different pharmacies. The prices here do not account for what the cost may be with help from insurance (it may be cheaper with your insurance), but the website can give you the price if you did not use any insurance.  - You can print the associated coupon and take it with your prescription to the pharmacy.  - You may also stop by our office during regular business hours and pick up a GoodRx coupon card.  - If you need your prescription sent electronically to a different pharmacy, notify our office through [facility] or by phone at [phone]

## 2023-07-11 ENCOUNTER — Other Ambulatory Visit: Payer: Self-pay | Admitting: Physician Assistant

## 2023-07-12 ENCOUNTER — Other Ambulatory Visit: Payer: Self-pay

## 2023-07-16 ENCOUNTER — Other Ambulatory Visit: Payer: Self-pay

## 2023-07-16 ENCOUNTER — Other Ambulatory Visit: Payer: Self-pay | Admitting: Physician Assistant
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
## 2023-07-17 ENCOUNTER — Other Ambulatory Visit: Payer: Self-pay | Admitting: Physician Assistant

## 2023-07-18 ENCOUNTER — Other Ambulatory Visit: Payer: Self-pay

## 2023-07-18 ENCOUNTER — Encounter: Payer: Self-pay | Admitting: Critical Care Medicine

## 2023-07-18 ENCOUNTER — Ambulatory Visit: Payer: Medicaid Other | Attending: Critical Care Medicine | Admitting: Critical Care Medicine

## 2023-07-18 VITALS — BP 145/84 | HR 71 | Temp 98.2°F | Ht 74.0 in | Wt 263.0 lb

## 2023-07-18 DIAGNOSIS — Z23 Encounter for immunization: Secondary | ICD-10-CM | POA: Diagnosis not present

## 2023-07-18 DIAGNOSIS — I700 Atherosclerosis of aorta: Secondary | ICD-10-CM

## 2023-07-18 DIAGNOSIS — E785 Hyperlipidemia, unspecified: Secondary | ICD-10-CM

## 2023-07-18 DIAGNOSIS — Z716 Tobacco abuse counseling: Secondary | ICD-10-CM | POA: Diagnosis not present

## 2023-07-18 DIAGNOSIS — I82511 Chronic embolism and thrombosis of right femoral vein: Secondary | ICD-10-CM | POA: Diagnosis not present

## 2023-07-18 DIAGNOSIS — F17210 Nicotine dependence, cigarettes, uncomplicated: Secondary | ICD-10-CM | POA: Diagnosis not present

## 2023-07-18 DIAGNOSIS — Z7984 Long term (current) use of oral hypoglycemic drugs: Secondary | ICD-10-CM | POA: Diagnosis not present

## 2023-07-18 DIAGNOSIS — X58XXXA Exposure to other specified factors, initial encounter: Secondary | ICD-10-CM | POA: Diagnosis not present

## 2023-07-18 DIAGNOSIS — H5789 Other specified disorders of eye and adnexa: Secondary | ICD-10-CM | POA: Diagnosis not present

## 2023-07-18 DIAGNOSIS — F1096 Alcohol use, unspecified with alcohol-induced persisting amnestic disorder: Secondary | ICD-10-CM | POA: Diagnosis not present

## 2023-07-18 DIAGNOSIS — S30811A Abrasion of abdominal wall, initial encounter: Secondary | ICD-10-CM | POA: Diagnosis not present

## 2023-07-18 DIAGNOSIS — D649 Anemia, unspecified: Secondary | ICD-10-CM | POA: Diagnosis not present

## 2023-07-18 DIAGNOSIS — E1140 Type 2 diabetes mellitus with diabetic neuropathy, unspecified: Secondary | ICD-10-CM | POA: Diagnosis not present

## 2023-07-18 DIAGNOSIS — R7989 Other specified abnormal findings of blood chemistry: Secondary | ICD-10-CM | POA: Insufficient documentation

## 2023-07-18 DIAGNOSIS — Z7901 Long term (current) use of anticoagulants: Secondary | ICD-10-CM

## 2023-07-18 DIAGNOSIS — Z79899 Other long term (current) drug therapy: Secondary | ICD-10-CM | POA: Insufficient documentation

## 2023-07-18 DIAGNOSIS — R262 Difficulty in walking, not elsewhere classified: Secondary | ICD-10-CM | POA: Diagnosis not present

## 2023-07-18 DIAGNOSIS — K709 Alcoholic liver disease, unspecified: Secondary | ICD-10-CM

## 2023-07-18 DIAGNOSIS — E1165 Type 2 diabetes mellitus with hyperglycemia: Secondary | ICD-10-CM | POA: Diagnosis not present

## 2023-07-18 DIAGNOSIS — F17200 Nicotine dependence, unspecified, uncomplicated: Secondary | ICD-10-CM

## 2023-07-18 DIAGNOSIS — K7031 Alcoholic cirrhosis of liver with ascites: Secondary | ICD-10-CM | POA: Diagnosis not present

## 2023-07-18 DIAGNOSIS — I10 Essential (primary) hypertension: Secondary | ICD-10-CM | POA: Diagnosis not present

## 2023-07-18 LAB — POCT GLYCOSYLATED HEMOGLOBIN (HGB A1C): HbA1c, POC (controlled diabetic range): 5.9 % (ref 0.0–7.0)

## 2023-07-18 LAB — GLUCOSE, POCT (MANUAL RESULT ENTRY): POC Glucose: 90 mg/dL (ref 70–99)

## 2023-07-18 MED ORDER — EZETIMIBE 10 MG PO TABS
10.0000 mg | ORAL_TABLET | Freq: Every day | ORAL | 3 refills | Status: DC
  Filled 2023-07-18 – 2023-08-28 (×2): qty 90, 90d supply, fill #0

## 2023-07-18 MED ORDER — DOXYCYCLINE HYCLATE 100 MG PO TABS
100.0000 mg | ORAL_TABLET | Freq: Two times a day (BID) | ORAL | 0 refills | Status: AC
  Filled 2023-07-18: qty 20, 10d supply, fill #0

## 2023-07-18 MED ORDER — PANTOPRAZOLE SODIUM 40 MG PO TBEC
40.0000 mg | DELAYED_RELEASE_TABLET | Freq: Every day | ORAL | 2 refills | Status: DC
  Filled 2023-07-18 – 2023-08-03 (×2): qty 60, 60d supply, fill #0
  Filled 2023-10-02: qty 60, 60d supply, fill #1

## 2023-07-18 MED ORDER — GABAPENTIN 400 MG PO CAPS
800.0000 mg | ORAL_CAPSULE | Freq: Four times a day (QID) | ORAL | 0 refills | Status: DC
  Filled 2023-07-18: qty 180, 23d supply, fill #0

## 2023-07-18 MED ORDER — CLONIDINE HCL 0.1 MG PO TABS
0.1000 mg | ORAL_TABLET | Freq: Two times a day (BID) | ORAL | 2 refills | Status: DC
  Filled 2023-07-18 – 2023-08-03 (×2): qty 120, 60d supply, fill #0
  Filled 2023-10-02: qty 120, 60d supply, fill #1

## 2023-07-18 MED ORDER — METOPROLOL TARTRATE 75 MG PO TABS
75.0000 mg | ORAL_TABLET | Freq: Two times a day (BID) | ORAL | 2 refills | Status: DC
  Filled 2023-07-18 – 2023-08-03 (×2): qty 120, 60d supply, fill #0
  Filled 2023-10-02: qty 120, 60d supply, fill #1

## 2023-07-18 MED ORDER — GLIPIZIDE 5 MG PO TABS
5.0000 mg | ORAL_TABLET | Freq: Two times a day (BID) | ORAL | 2 refills | Status: DC
  Filled 2023-07-18 – 2023-08-03 (×2): qty 60, 30d supply, fill #0
  Filled 2023-09-18 – 2023-10-02 (×2): qty 60, 30d supply, fill #1

## 2023-07-18 MED ORDER — RIVAROXABAN 20 MG PO TABS
20.0000 mg | ORAL_TABLET | Freq: Every day | ORAL | 2 refills | Status: DC
  Filled 2023-08-22: qty 60, 60d supply, fill #0

## 2023-07-18 MED ORDER — SPIRONOLACTONE 50 MG PO TABS
50.0000 mg | ORAL_TABLET | Freq: Every day | ORAL | 2 refills | Status: DC
  Filled 2023-07-18: qty 90, 90d supply, fill #0

## 2023-07-18 NOTE — Assessment & Plan Note (Signed)
Hemoglobin A1c currently is at goal check labs no change in medication

## 2023-07-18 NOTE — Assessment & Plan Note (Signed)
 Continue with folic acid

## 2023-07-18 NOTE — Assessment & Plan Note (Signed)
Workup for cirrhosis was unrevealing he does not have alcoholic cirrhosis

## 2023-07-18 NOTE — Assessment & Plan Note (Signed)
Condition is more related to alcoholic hepatitis we will recheck liver function advised to reduce alcohol further he has declined rehab in the past

## 2023-07-18 NOTE — Assessment & Plan Note (Signed)
Controlled at present check labs no change in medication smoking reduction advised

## 2023-07-18 NOTE — Assessment & Plan Note (Signed)
Not committed to stopping smoking at this time    Current smoking consumption amount: 1 pack a day  Dicsussion on advise to quit smoking and smoking impacts: Lung impacts  Patient's willingness to quit: Not willing to quit  Methods to quit smoking discussed: Not discussed  Medication management of smoking session drugs discussed: Failed Resources provided:  AVS   Setting quit date not established  Follow-up arranged 2 months  Time spent counseling the patient: 5 minutes  

## 2023-07-18 NOTE — Assessment & Plan Note (Signed)
Has IVC filter and chronic thrombosis in large veins in right lower extremity will continue Eliquis for life check CBC

## 2023-07-18 NOTE — Telephone Encounter (Signed)
Requested medication (s) are due for refill today - yes  Requested medication (s) are on the active medication list -yes  Future visit scheduled -today  Last refill: 06/18/23 #180  Notes to clinic: mobile unit provider, has notes- needs appointment- scheduled today- hold for visit  Requested Prescriptions  Pending Prescriptions Disp Refills   gabapentin (NEURONTIN) 400 MG capsule 180 capsule 0    Sig: Take 2 capsules (800 mg total) by mouth 4 (four) times daily. Office visit req for further refills     Neurology: Anticonvulsants - gabapentin Failed - 07/18/2023  9:34 AM      Failed - Cr in normal range and within 360 days    Creatinine, Ser  Date Value Ref Range Status  11/02/2022 0.68 (L) 0.76 - 1.27 mg/dL Final         Passed - Completed PHQ-2 or PHQ-9 in the last 360 days      Passed - Valid encounter within last 12 months    Recent Outpatient Visits           7 months ago Alcohol abuse   [facility] Dept Of [place]. [facility] Storm Frisk, MD   8 months ago Cellulitis of right lower extremity   [facility] [facility] - A Dept Of [place]. [facility] Storm Frisk, MD   9 months ago Alcoholic cirrhosis of liver with ascites [facility])   [facility] [facility] - A Dept Of [place]. [facility] Storm Frisk, MD   10 months ago Alcoholic cirrhosis of liver with ascites [facility])   [place] [facility] - A Dept Of [place]. [facility] Storm Frisk, MD   11 months ago Need for immunization against influenza   [facility] - A Dept Of [place]. [facility] [place], Marzella Schlein, [place]       Future Appointments             Today Storm Frisk, MD [place] [facility] - A Dept Of [place]igha Bridegroom. [facility]               Requested Prescriptions  Pending Prescriptions Disp Refills   gabapentin (NEURONTIN) 400 MG  capsule 180 capsule 0    Sig: Take 2 capsules (800 mg total) by mouth 4 (four) times daily. Office visit req for further refills     Neurology: Anticonvulsants - gabapentin Failed - 07/18/2023  9:34 AM      Failed - Cr in normal range and within 360 days    Creatinine, Ser  Date Value Ref Range Status  11/02/2022 0.68 (L) 0.76 - 1.27 mg/dL Final         Passed - Completed PHQ-2 or PHQ-9 in the last 360 days      Passed - Valid encounter within last 12 months    Recent Outpatient Visits           7 months ago Alcohol abuse   [facility] - A Dept Of [place]. [facility] Storm Frisk, MD   8 months ago Cellulitis of right lower extremity   [place] [facility] - A Dept Of [place]. [facility] Storm Frisk, MD   9 months ago Alcoholic cirrhosis of liver with ascites [facility])   [place] [facility] - A Dept  Of [place]. [facility] Storm Frisk, MD   10 months ago Alcoholic cirrhosis of liver with ascites [facility])   [place] [facility] - A Dept Of [place]. [facility] Storm Frisk, MD   11 months ago Need for immunization against influenza   [place] [facility] [place] - A Dept Of . [facility] [place], Marzella Schlein, [place]       Future Appointments             Today Storm Frisk, MD [place] [facility] - A Dept Of Eligha Bridegroom. [facility]

## 2023-07-18 NOTE — Patient Instructions (Signed)
Use the cream the dermatology doctor gave you on the right lower leg All medications refilled, no changes except take doxycycline twice a day for 10days for skin infection R lower leg, take zinc 50mg  daily for one month Labs today Return 5 months

## 2023-07-23 ENCOUNTER — Ambulatory Visit: Payer: Medicaid Other | Admitting: Dermatology

## 2023-08-03 ENCOUNTER — Other Ambulatory Visit: Payer: Self-pay | Admitting: Critical Care Medicine

## 2023-08-04 ENCOUNTER — Other Ambulatory Visit: Payer: Self-pay

## 2023-08-06 ENCOUNTER — Other Ambulatory Visit: Payer: Self-pay

## 2023-08-06 MED ORDER — GABAPENTIN 400 MG PO CAPS
800.0000 mg | ORAL_CAPSULE | Freq: Four times a day (QID) | ORAL | 0 refills | Status: DC
  Filled 2023-08-06 – 2023-08-08 (×2): qty 180, 23d supply, fill #0

## 2023-08-08 ENCOUNTER — Other Ambulatory Visit: Payer: Self-pay

## 2023-08-22 ENCOUNTER — Other Ambulatory Visit: Payer: Self-pay | Admitting: Critical Care Medicine

## 2023-08-22 ENCOUNTER — Other Ambulatory Visit: Payer: Self-pay

## 2023-08-22 NOTE — Telephone Encounter (Signed)
Requested medication (s) are due for refill today: yes  Requested medication (s) are on the active medication list: yes  Last refill:  08/06/23  Future visit scheduled: yes  Notes to clinic:  Unable to refill per protocol, courtesy refill already given, routing for provider approval.      Requested Prescriptions  Pending Prescriptions Disp Refills   gabapentin (NEURONTIN) 400 MG capsule 180 capsule 0    Sig: Take 2 capsules (800 mg total) by mouth 4 (four) times daily. Office visit req for further refills     Neurology: Anticonvulsants - gabapentin Failed - 08/22/2023  3:34 PM      Failed - Cr in normal range and within 360 days    Creatinine, Ser  Date Value Ref Range Status  11/02/2022 0.68 (L) 0.76 - 1.27 mg/dL Final         Passed - Completed PHQ-2 or PHQ-9 in the last 360 days      Passed - Valid encounter within last 12 months    Recent Outpatient Visits           1 month ago Type 2 diabetes mellitus with hyperglycemia, without long-term current use of insulin (HCC)   [facility] Dept Of [place]. [facility] Storm Frisk, MD   8 months ago Alcohol abuse   [place] [facility] [place] - A Dept Of [place]. [facility] Storm Frisk, MD   9 months ago Cellulitis of right lower extremity   [place] [facility] - A Dept Of [place]. [facility] Storm Frisk, MD   10 months ago Alcoholic cirrhosis of liver with ascites [facility])   [place] [facility] - A Dept Of [place]. [facility] Storm Frisk, MD   11 months ago Alcoholic cirrhosis of liver with ascites [facility])   [place] [facility] - A Dept Of . [facility] Storm Frisk, MD       Future Appointments             In 3 months Hoy Register, MD [facility] [place] - A Dept Of Eligha Bridegroom. [facility]

## 2023-08-23 ENCOUNTER — Other Ambulatory Visit: Payer: Self-pay

## 2023-08-23 MED ORDER — GABAPENTIN 400 MG PO CAPS
800.0000 mg | ORAL_CAPSULE | Freq: Four times a day (QID) | ORAL | 0 refills | Status: DC
  Filled 2023-08-23 – 2023-08-28 (×2): qty 180, 23d supply, fill #0

## 2023-08-28 ENCOUNTER — Other Ambulatory Visit: Payer: Self-pay

## 2023-09-18 ENCOUNTER — Other Ambulatory Visit: Payer: Self-pay

## 2023-09-18 ENCOUNTER — Other Ambulatory Visit: Payer: Self-pay | Admitting: Critical Care Medicine

## 2023-09-18 MED ORDER — GABAPENTIN 400 MG PO CAPS
800.0000 mg | ORAL_CAPSULE | Freq: Four times a day (QID) | ORAL | 0 refills | Status: DC
  Filled 2023-09-18: qty 180, 23d supply, fill #0

## 2023-09-27 ENCOUNTER — Other Ambulatory Visit: Payer: Self-pay

## 2023-10-02 ENCOUNTER — Other Ambulatory Visit: Payer: Self-pay

## 2023-10-02 ENCOUNTER — Other Ambulatory Visit: Payer: Self-pay | Admitting: Critical Care Medicine

## 2023-10-03 ENCOUNTER — Other Ambulatory Visit: Payer: Self-pay

## 2023-10-03 ENCOUNTER — Ambulatory Visit: Admitting: Pharmacist
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
## 2023-10-08 ENCOUNTER — Other Ambulatory Visit: Payer: Self-pay | Admitting: Physician Assistant

## 2023-10-08 ENCOUNTER — Other Ambulatory Visit: Payer: Self-pay

## 2023-10-08 MED ORDER — GABAPENTIN 400 MG PO CAPS
800.0000 mg | ORAL_CAPSULE | Freq: Four times a day (QID) | ORAL | 0 refills | Status: DC
  Filled 2023-10-08 – 2023-10-09 (×2): qty 180, 23d supply, fill #0

## 2023-10-09 ENCOUNTER — Other Ambulatory Visit: Payer: Self-pay

## 2023-10-11 ENCOUNTER — Encounter: Payer: Self-pay | Admitting: Critical Care Medicine

## 2023-10-11 ENCOUNTER — Ambulatory Visit: Attending: Critical Care Medicine | Admitting: Critical Care Medicine

## 2023-10-11 ENCOUNTER — Other Ambulatory Visit: Payer: Self-pay

## 2023-10-11 VITALS — BP 135/76 | Resp 20 | Ht 74.0 in | Wt 268.2 lb

## 2023-10-11 DIAGNOSIS — Z7984 Long term (current) use of oral hypoglycemic drugs: Secondary | ICD-10-CM | POA: Insufficient documentation

## 2023-10-11 DIAGNOSIS — F17210 Nicotine dependence, cigarettes, uncomplicated: Secondary | ICD-10-CM | POA: Insufficient documentation

## 2023-10-11 DIAGNOSIS — E785 Hyperlipidemia, unspecified: Secondary | ICD-10-CM

## 2023-10-11 DIAGNOSIS — I82511 Chronic embolism and thrombosis of right femoral vein: Secondary | ICD-10-CM

## 2023-10-11 DIAGNOSIS — E1165 Type 2 diabetes mellitus with hyperglycemia: Secondary | ICD-10-CM | POA: Diagnosis not present

## 2023-10-11 DIAGNOSIS — I700 Atherosclerosis of aorta: Secondary | ICD-10-CM | POA: Diagnosis not present

## 2023-10-11 DIAGNOSIS — R7989 Other specified abnormal findings of blood chemistry: Secondary | ICD-10-CM | POA: Insufficient documentation

## 2023-10-11 DIAGNOSIS — I10 Essential (primary) hypertension: Secondary | ICD-10-CM | POA: Diagnosis not present

## 2023-10-11 DIAGNOSIS — Z716 Tobacco abuse counseling: Secondary | ICD-10-CM | POA: Diagnosis not present

## 2023-10-11 DIAGNOSIS — E1140 Type 2 diabetes mellitus with diabetic neuropathy, unspecified: Secondary | ICD-10-CM | POA: Diagnosis not present

## 2023-10-11 DIAGNOSIS — I82401 Acute embolism and thrombosis of unspecified deep veins of right lower extremity: Secondary | ICD-10-CM

## 2023-10-11 DIAGNOSIS — R262 Difficulty in walking, not elsewhere classified: Secondary | ICD-10-CM | POA: Insufficient documentation

## 2023-10-11 DIAGNOSIS — F17290 Nicotine dependence, other tobacco product, uncomplicated: Secondary | ICD-10-CM | POA: Diagnosis not present

## 2023-10-11 DIAGNOSIS — J439 Emphysema, unspecified: Secondary | ICD-10-CM | POA: Insufficient documentation

## 2023-10-11 DIAGNOSIS — K709 Alcoholic liver disease, unspecified: Secondary | ICD-10-CM

## 2023-10-11 DIAGNOSIS — Z79899 Other long term (current) drug therapy: Secondary | ICD-10-CM | POA: Insufficient documentation

## 2023-10-11 DIAGNOSIS — Z7901 Long term (current) use of anticoagulants: Secondary | ICD-10-CM | POA: Insufficient documentation

## 2023-10-11 DIAGNOSIS — D649 Anemia, unspecified: Secondary | ICD-10-CM | POA: Diagnosis not present

## 2023-10-11 DIAGNOSIS — R413 Other amnesia: Secondary | ICD-10-CM | POA: Insufficient documentation

## 2023-10-11 DIAGNOSIS — S30811A Abrasion of abdominal wall, initial encounter: Secondary | ICD-10-CM | POA: Diagnosis not present

## 2023-10-11 DIAGNOSIS — Z23 Encounter for immunization: Secondary | ICD-10-CM | POA: Insufficient documentation

## 2023-10-11 DIAGNOSIS — I82501 Chronic embolism and thrombosis of unspecified deep veins of right lower extremity: Secondary | ICD-10-CM | POA: Diagnosis present

## 2023-10-11 DIAGNOSIS — F17200 Nicotine dependence, unspecified, uncomplicated: Secondary | ICD-10-CM

## 2023-10-11 MED ORDER — GABAPENTIN 400 MG PO CAPS
800.0000 mg | ORAL_CAPSULE | Freq: Four times a day (QID) | ORAL | 0 refills | Status: DC
Start: 2023-10-11 — End: 2023-11-19
  Filled 2023-10-11 – 2023-10-31 (×2): qty 180, 23d supply, fill #0

## 2023-10-11 MED ORDER — NICOTINE POLACRILEX 4 MG MT LOZG
LOZENGE | OROMUCOSAL | 1 refills | Status: DC
  Filled 2023-10-11: qty 72, 24d supply, fill #0
  Filled 2023-10-31: qty 72, 24d supply, fill #1

## 2023-10-11 MED ORDER — NICOTINE 21 MG/24HR TD PT24
21.0000 mg | MEDICATED_PATCH | Freq: Every day | TRANSDERMAL | 0 refills | Status: DC
  Filled 2023-10-11: qty 28, 28d supply, fill #0

## 2023-10-11 MED ORDER — CLONIDINE HCL 0.1 MG PO TABS
0.1000 mg | ORAL_TABLET | Freq: Two times a day (BID) | ORAL | 2 refills | Status: DC
  Filled 2023-10-11 – 2023-12-13 (×2): qty 120, 60d supply, fill #0
  Filled 2024-02-10: qty 120, 60d supply, fill #1
  Filled 2024-05-11: qty 120, 60d supply, fill #2

## 2023-10-11 MED ORDER — EZETIMIBE 10 MG PO TABS
10.0000 mg | ORAL_TABLET | Freq: Every day | ORAL | 3 refills | Status: AC
  Filled 2023-10-11 – 2023-12-13 (×2): qty 90, 90d supply, fill #0
  Filled 2024-04-07: qty 90, 90d supply, fill #1

## 2023-10-11 MED ORDER — RIVAROXABAN 20 MG PO TABS
20.0000 mg | ORAL_TABLET | Freq: Every day | ORAL | 2 refills | Status: DC
  Filled 2023-10-11 – 2023-10-23 (×2): qty 60, 60d supply, fill #0
  Filled 2023-12-23 – 2024-01-13 (×3): qty 60, 60d supply, fill #1

## 2023-10-11 MED ORDER — METOPROLOL TARTRATE 75 MG PO TABS
75.0000 mg | ORAL_TABLET | Freq: Two times a day (BID) | ORAL | 2 refills | Status: DC
  Filled 2023-10-11 – 2023-12-13 (×2): qty 120, 60d supply, fill #0
  Filled 2024-02-10: qty 120, 60d supply, fill #1
  Filled 2024-05-11: qty 60, 30d supply, fill #2
  Filled 2024-06-06: qty 60, 30d supply, fill #3

## 2023-10-11 MED ORDER — PANTOPRAZOLE SODIUM 40 MG PO TBEC
40.0000 mg | DELAYED_RELEASE_TABLET | Freq: Every day | ORAL | 2 refills | Status: DC
  Filled 2023-10-11 – 2023-12-13 (×2): qty 60, 60d supply, fill #0
  Filled 2024-02-10: qty 60, 60d supply, fill #1
  Filled 2024-05-11: qty 60, 60d supply, fill #2

## 2023-10-11 MED ORDER — FOLIC ACID 1 MG PO TABS
1.0000 mg | ORAL_TABLET | Freq: Every day | ORAL | 2 refills | Status: DC
  Filled 2023-10-11: qty 30, 30d supply, fill #0
  Filled 2023-11-19: qty 30, 30d supply, fill #1
  Filled 2023-12-15: qty 30, 30d supply, fill #2
  Filled 2024-01-13: qty 30, 30d supply, fill #3
  Filled 2024-02-12: qty 30, 30d supply, fill #4
  Filled 2024-04-07: qty 30, 30d supply, fill #5
  Filled 2024-05-11: qty 30, 30d supply, fill #6
  Filled 2024-06-06: qty 30, 30d supply, fill #7

## 2023-10-11 MED ORDER — GLIPIZIDE 5 MG PO TABS
5.0000 mg | ORAL_TABLET | Freq: Two times a day (BID) | ORAL | 2 refills | Status: DC
  Filled 2023-10-11 – 2023-11-19 (×2): qty 60, 30d supply, fill #0
  Filled 2023-12-15: qty 60, 30d supply, fill #1
  Filled 2024-01-13: qty 60, 30d supply, fill #2

## 2023-10-11 NOTE — Patient Instructions (Signed)
 Labs to be retained and urine sample  Medications refilled  Trial of nicotine patch change daily and nicotine lozenges take 1 3 times a day as needed to reduce tobacco use  Return for primary care 4 months

## 2023-10-12 NOTE — Assessment & Plan Note (Signed)
 Counseled again regarding alcohol use patient is not interested in reducing usage previous evaluations did not show cirrhosis or ascites

## 2023-10-12 NOTE — Assessment & Plan Note (Signed)
 Continue with anticoagulation for life

## 2023-10-12 NOTE — Assessment & Plan Note (Signed)
 Continue oral therapy and recheck labs and urine study

## 2023-10-12 NOTE — Assessment & Plan Note (Signed)
 No change in medication blood pressure control

## 2023-10-12 NOTE — Assessment & Plan Note (Signed)
Not committed to stopping smoking at this time    Current smoking consumption amount: 1 pack a day  Dicsussion on advise to quit smoking and smoking impacts: Lung impacts  Patient's willingness to quit: Not willing to quit  Methods to quit smoking discussed: Not discussed  Medication management of smoking session drugs discussed: Failed Resources provided:  AVS   Setting quit date not established  Follow-up arranged 2 months  Time spent counseling the patient: 5 minutes  

## 2023-10-13 LAB — MICROALBUMIN / CREATININE URINE RATIO
Creatinine, Urine: 20.5 mg/dL
Microalb/Creat Ratio: <15 mg/g{creat} (ref 0–29)
Microalbumin, Urine: <3 ug/mL

## 2023-10-13 LAB — LIPID PANEL
Chol/HDL Ratio: 3 ratio (ref 0.0–5.0)
Cholesterol, Total: 198 mg/dL (ref 100–199)
HDL: 67 mg/dL (ref >39)
LDL Chol Calc (NIH): 94 mg/dL (ref 0–99)
Triglycerides: 220 mg/dL — ABNORMAL HIGH (ref 0–149)
VLDL Cholesterol Cal: 37 mg/dL (ref 5–40)

## 2023-10-13 LAB — HEMOGLOBIN A1C
Est. average glucose Bld gHb Est-mCnc: 120 mg/dL
Hgb A1c MFr Bld: 5.8 % — ABNORMAL HIGH (ref 4.8–5.6)

## 2023-10-19 ENCOUNTER — Telehealth: Payer: Self-pay

## 2023-10-19 DIAGNOSIS — K709 Alcoholic liver disease, unspecified: Secondary | ICD-10-CM

## 2023-10-19 NOTE — Telephone Encounter (Signed)
 Patients wife is wanting patient to see/talk with a counselor.  I fax-email some information about organization that help with people who have memory loss get job.Erskine Squibb do you know any other services that help with this ?

## 2023-10-19 NOTE — Telephone Encounter (Signed)
-----   Message from Shan Levans sent at 10/15/2023 10:43 AM EDT ----- Let pt know all labs normal

## 2023-10-22 NOTE — Telephone Encounter (Signed)
 I spoke to patient's wife, Higinio Plan and she explained that she is interested in counseling for the patient for his drinking. She also explained that he had been working in [employer] since 1996 but is no longer able to do that work due to his multiple medical problems. She would like some help with finding employment for him.  She said she is very stressed because of his on-going drinking and not working. All of the responsibility falls on her and she said she does not want to have to make him leave their home and become homeless.  She was in agreement to a referral to [facility].

## 2023-10-22 NOTE — Addendum Note (Signed)
 Addended by: Robyne Peers on: 10/22/2023 05:57 PM   Modules accepted: Orders

## 2023-10-23 ENCOUNTER — Other Ambulatory Visit: Payer: Self-pay

## 2023-10-24 ENCOUNTER — Telehealth: Payer: Self-pay | Admitting: *Deleted

## 2023-10-24 NOTE — Progress Notes (Signed)
 Complex Care Management Note Care Guide Note  10/24/2023 Name: Dylan Snyder MRN: [id number] DOB: 1971-06-23   Complex Care Management Outreach Attempts: An unsuccessful telephone outreach was attempted today to offer the patient information about available complex care management services.  Follow Up Plan:  Additional outreach attempts will be made to offer the patient complex care management information and services.   Encounter Outcome:  No Answer  Burman Nieves, CMA, Care Guide [facility]  [facility], [facility] Guide Direct Dial: [phone]  Fax: [phone] Website: [email or web address]

## 2023-10-25 NOTE — Progress Notes (Signed)
 Complex Care Management Note Care Guide Note  10/25/2023 Name: Dylan Snyder MRN: [id number] DOB: 1971/07/18   Complex Care Management Outreach Attempts: A second unsuccessful outreach was attempted today to offer the patient with information about available complex care management services.  Follow Up Plan:  Additional outreach attempts will be made to offer the patient complex care management information and services.   Encounter Outcome:  No Answer  Burman Nieves, CMA [place]  [facility], [facility] Guide Direct Dial: [phone]  Fax: [phone] Website: [email or web address]

## 2023-10-29 ENCOUNTER — Other Ambulatory Visit: Payer: Self-pay

## 2023-10-29 NOTE — Progress Notes (Signed)
 Complex Care Management Note  Care Guide Note 10/29/2023 Name: RICHARDS PHERIGO MRN: [id number] DOB: 1970-11-20  EINAR NOLASCO is a 53 y.o. year old male who sees Storm Frisk, MD for primary care. I reached out to [name] by phone today to offer complex care management services.  Mr. Boudoin was given information about Complex Care Management services today including:   The Complex Care Management services include support from the care team which includes your Nurse Care Manager, Clinical Social Worker, or Pharmacist.  The Complex Care Management team is here to help remove barriers to the health concerns and goals most important to you. Complex Care Management services are voluntary, and the patient may decline or stop services at any time by request to their care team member.   Complex Care Management Consent Status: Patient agreed to services and verbal consent obtained.   Follow up plan:  Telephone appointment with complex care management team member scheduled for:  10/31/2023  Encounter Outcome:  Patient Scheduled  Burman Nieves, CMA [place]  [facility], [facility] Guide Direct Dial: [phone]  Fax: [phone] Website: [email or web address]

## 2023-10-31 ENCOUNTER — Other Ambulatory Visit: Payer: Self-pay

## 2023-10-31 ENCOUNTER — Ambulatory Visit: Payer: Self-pay | Admitting: Licensed Clinical Social Worker

## 2023-10-31 NOTE — Patient Instructions (Signed)
 Visit Information  Thank you for taking time to visit with me today. Please don't hesitate to contact me if I can be of assistance to you.   Following are the goals we discussed today:   Goals Addressed             This Visit's Progress    client has history of alcohol abuse. Smoking history       Interventions:  Spoke via phone with Dylan Snyder, spouse of client, about client needs Dylan Snyder said client only works part time occasionally with a friend Spoke about medication procurement of client. Dylan Snyder said client has prescribed medications. Spoke about sleeping of client  Client has decreased sleep. Client has decreased appetite (per [place]) Regarding walking, Dylan Snyder has a limp when he walks Regarding DME, client has a cane and a walker to use as needed. Dylan Snyder said client seldom uses cane or walker Discussed vision of client. Dylan Snyder said client may need eye exam scheduled  Discussed support for client with PCP, Dr. Delford Field Regarding mood, Dylan Snyder feels that client has low motivation to change. 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  Dylan Snyder said client may take naps during day if he is not working Discussed memory issues of Dylan Snyder. Dylan Snyder feels that client has short term memory issues She said he has difficulty remembering items Discussed transport of client.  Client drives short distances Discussed program support with RN, LCSW, Pharmacist. Encouraged client to access program services as needed Dylan Snyder is feeling stressed in managing her job and the needs of client.  She said client is drinking regularly. She said client is smoking about 1.5 packs of cigarettes per day.  She is concerned about memory issues of client. She is concerned about drinking of client.  Keenan Bachelor for phone call with LCSW today Encouraged Dylan Snyder or client to call LCSW as needed for SW support for client at [phone] Dylan Snyder,  spouse of client and contact for client , was appreciative of call from LCSW today              Our next appointment is by telephone on 11/28/23 at 9:30 AM   Please call the care guide team at [phone] if you need to cancel or reschedule your appointment.   If you are experiencing a Mental Health or Behavioral Health Crisis or need someone to talk to, please go to [facility] [address] [place] [phone])   The patient / Dylan Snyder, spouse of client, verbalized understanding of instructions, educational materials, and care plan provided today and DECLINED offer to receive copy of patient instructions, educational materials, and care plan.   The patient/ Dylan Snyder, spouse of client,  has been provided with contact information for the care management team and has been advised to call with any health related questions or concerns.    Lorna Few  MSW, LCSW [place] Licensed Clinical Social Worker Direct Dial:  [phone] Fax:  [phone] Website:  [email or web address]

## 2023-10-31 NOTE — Patient Outreach (Signed)
 Care Coordination   Initial Visit Note   10/31/2023 Name: Dylan Snyder MRN: [id number] DOB: [birth year]  Dylan Snyder is a 53 y.o. year old male who sees Dylan Frisk, MD for primary care. I spoke with  Dylan Snyder / Dylan Snyder, spouse of client and contact for client, via phone today.  What matters to the patients health and wellness today? client has history of alcohol abuse. Smoking history     Goals Addressed             This Visit's Progress    client has history of alcohol abuse. Smoking history       Interventions:  Spoke via phone with Dylan Snyder, spouse of client, about client needs Dylan Snyder said client only works part time occasionally with a friend Spoke about medication procurement of client. Dylan Snyder said client has prescribed medications. Spoke about sleeping of client  Client has decreased sleep. Client has decreased appetite (per Dylan Snyder) Regarding walking, Dylan Snyder has a limp when he walks Regarding Dylan Snyder, client has a cane and a walker to use as needed. Dylan Snyder said client seldom uses cane or walker Discussed vision of client. Dylan Snyder said client may need eye exam scheduled  Discussed support for client with PCP, Dylan Snyder Regarding mood, Dylan Snyder feels that client has low motivation to change. 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  Dylan Snyder said client may take naps during day if he is not working Discussed memory issues of Dylan Snyder. Dylan Snyder feels that client has short term memory issues She said he has difficulty remembering items Discussed transport of client.  Client drives short distances Discussed program support with RN, LCSW, Pharmacist. Encouraged client to access program services as needed Dylan Snyder is feeling stressed in managing her job and the needs of client.  She said client is drinking regularly. She said client is smoking about 1.5 packs of cigarettes per day.  She is concerned about  memory issues of client. She is concerned about drinking of client.  Keenan Bachelor for phone call with LCSW today Encouraged Dylan Snyder or client to call LCSW as needed for SW support for client at [phone] Dylan Snyder, spouse of client and contact for client , was appreciative of call from LCSW today              SDOH assessments and interventions completed:  Yes  SDOH Interventions Today    Flowsheet Row Most Recent Value  SDOH Interventions   Depression Interventions/Treatment  Patient refuses Treatment  Physical Activity Interventions Other (Comments)  [client walks with a limp]  Stress Interventions Other (Comment)  [client wants to find a job]        Care Coordination Interventions:  Yes, provided   Interventions Today    Flowsheet Row Most Recent Value  Chronic Disease   Chronic disease during today's visit Other  [spoke with spouse of client about client needs]  General Interventions   General Interventions Discussed/Reviewed General Interventions Discussed, Community Resources  Education Interventions   Education Provided Provided Education  Provided Verbal Education On [employer]  Mental Health Interventions   Mental Health Discussed/Reviewed Coping Strategies  [client has low motivation to change,  has support of his spouse. alcohol abuse history of client]  Nutrition Interventions   Nutrition Discussed/Reviewed Nutrition Discussed  [client has decreased appetite]  Pharmacy Interventions   Pharmacy Dicussed/Reviewed Pharmacy Topics  Discussed  Safety Interventions   Safety Discussed/Reviewed Fall Risk        Follow up plan: Follow up call scheduled for 11/28/23 at 9:30 AM    Encounter Outcome:  Patient Visit Completed    Lorna Few  MSW, LCSW [place] Licensed Clinical Social Worker Direct Dial:  [phone] Fax:  [phone] Website:  [email or web address]

## 2023-11-19 ENCOUNTER — Telehealth: Payer: Self-pay | Admitting: Critical Care Medicine

## 2023-11-19 ENCOUNTER — Other Ambulatory Visit: Payer: Self-pay | Admitting: Critical Care Medicine

## 2023-11-19 ENCOUNTER — Other Ambulatory Visit: Payer: Self-pay

## 2023-11-19 NOTE — Telephone Encounter (Signed)
 Pt last seen Dr. Brent Cambric on 10/29/2023 and Gabapentin  was sent to the pharmacy same day with a quantity of 180.

## 2023-11-19 NOTE — Telephone Encounter (Signed)
 Patient came to the office today requesting a refill for gabapentin , prescribed by Dr. Brent Cambric. Please advise.

## 2023-11-20 ENCOUNTER — Other Ambulatory Visit: Payer: Self-pay

## 2023-11-20 ENCOUNTER — Encounter (INDEPENDENT_AMBULATORY_CARE_PROVIDER_SITE_OTHER): Payer: Self-pay

## 2023-11-20 MED ORDER — GABAPENTIN 400 MG PO CAPS
800.0000 mg | ORAL_CAPSULE | Freq: Three times a day (TID) | ORAL | 0 refills | Status: DC
Start: 2023-11-20 — End: 2023-12-13
  Filled 2023-11-20: qty 180, 30d supply, fill #0

## 2023-11-20 NOTE — Telephone Encounter (Signed)
 Medication has been refilled.

## 2023-11-20 NOTE — Telephone Encounter (Signed)
 Sent pt a MyChart message

## 2023-11-28 ENCOUNTER — Ambulatory Visit: Payer: Self-pay | Admitting: Licensed Clinical Social Worker
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
## 2023-11-28 NOTE — Patient Instructions (Signed)
 Visit Information  Thank you for taking time to visit with me today. Please don't hesitate to contact me if I can be of assistance to you before our next scheduled appointment.  Our next appointment is by telephone on 01/21/24 at 3:30 PM   Please call the care guide team at [phone] if you need to cancel or reschedule your appointment.   Following is a copy of your care plan:   Goals Addressed             This Visit's Progress    [facility] Social Work Care Plan       Problems:   Cognitive Deficits              Memory issues             Daily alcohol  use             Decreased appetite             Sleep challenges             Medical side affects of continued alcohol  use               CSW Clinical Goal(s):   Over the next 30 days   the Patient will  consider some options for alcohol  use treatment for client AEB client participation in AA, NA meeting, discussion with PCP about alcohol  use, or engagement in rehab program  .              Over next 30 days, the patient will attend scheduled medical appointments AEB patient report and as documented in EPIC record   Interventions:  Spoke with Dylan Snyder, spouse of client, about client needs and status              Dylan Snyder reported that client is having short term memory issues, has difficulty remembering items or events              Discussed client discussion with PCP about client use of alcohol .  Dylan Snyder said client seems to have no desire to stop alcohol  use. She said he drinks about 10 beers per day; on some occasions, she said he may drink more             Discussed medication procurement for client. Client has reduced appetite and sleeping challenges           Client only drives short distances. 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 Mentioned to Dylan Snyder that AA meetings could also be helpful to family members of alcoholics           Dylan Snyder said client  likes to fish as a hobby.  She said numerous family members have talked with Dylan Snyder about alcohol  use.            Discussed program support for client with RN, LCSW, Pharmacist           Provided counseling support related to managing ongoing needs of client related to his continued alcohol  use.             Encouraged client or Dylan Snyder to call LCSW as needed for SW support for client at [phone]  Patient Goals/Self-Care Activities:  Client to attend scheduled medical appointments              Client to take medications as prescribed  Client to consider alcohol  treatment support services:  AA, NA, rehab program support            Client to allow time for rest and relaxation activities            Client to try to stay away from areas where others are drinking alcohol  Plan:   LCSW to call Dylan Snyder on 01/21/24 at 3:30 PM to discuss client needs        Please go to [facility] [address] [place] [phone]) if you are experiencing a Mental Health or Behavioral Health Crisis or need someone to talk to.  The patient / Dylan Snyder, spouse of patient, verbalized understanding of instructions, educational materials, and care plan provided today and DECLINED offer to receive copy of patient instructions, educational materials, and care plan.   Patient Dylan Snyder, spouse of patient were provided information on how to contact Care Team for support. LCSW gave Dylan Lai LCSW name and phone number and encouraged her to call LCSW as needed for SW support for client    Dylan Snyder  MSW, LCSW [place]/[facility] Licensed Clinical Social Worker Direct Dial:  [phone] Fax:  [phone] Website:  [email or web address]

## 2023-12-13 ENCOUNTER — Other Ambulatory Visit: Payer: Self-pay | Admitting: Family Medicine

## 2023-12-13 ENCOUNTER — Other Ambulatory Visit: Payer: Self-pay

## 2023-12-14 ENCOUNTER — Other Ambulatory Visit: Payer: Self-pay

## 2023-12-14 MED ORDER — GABAPENTIN 400 MG PO CAPS
800.0000 mg | ORAL_CAPSULE | Freq: Three times a day (TID) | ORAL | 0 refills | Status: DC
  Filled 2023-12-14 – 2023-12-15 (×2): qty 180, 30d supply, fill #0

## 2023-12-15 ENCOUNTER — Other Ambulatory Visit: Payer: Self-pay

## 2023-12-17 ENCOUNTER — Other Ambulatory Visit: Payer: Self-pay

## 2023-12-17 ENCOUNTER — Encounter: Payer: Self-pay | Admitting: Family Medicine

## 2023-12-17 ENCOUNTER — Other Ambulatory Visit: Payer: Self-pay | Admitting: Pharmacist

## 2023-12-17 ENCOUNTER — Ambulatory Visit: Payer: Medicaid Other | Attending: Family Medicine | Admitting: Family Medicine

## 2023-12-17 VITALS — BP 130/77 | HR 73 | Ht 74.0 in | Wt 262.6 lb

## 2023-12-17 DIAGNOSIS — G629 Polyneuropathy, unspecified: Secondary | ICD-10-CM

## 2023-12-17 DIAGNOSIS — F1090 Alcohol use, unspecified, uncomplicated: Secondary | ICD-10-CM

## 2023-12-17 DIAGNOSIS — I872 Venous insufficiency (chronic) (peripheral): Secondary | ICD-10-CM | POA: Diagnosis not present

## 2023-12-17 DIAGNOSIS — L98492 Non-pressure chronic ulcer of skin of other sites with fat layer exposed: Secondary | ICD-10-CM | POA: Insufficient documentation

## 2023-12-17 DIAGNOSIS — Z79899 Other long term (current) drug therapy: Secondary | ICD-10-CM | POA: Diagnosis not present

## 2023-12-17 DIAGNOSIS — G621 Alcoholic polyneuropathy: Secondary | ICD-10-CM | POA: Insufficient documentation

## 2023-12-17 DIAGNOSIS — E11622 Type 2 diabetes mellitus with other skin ulcer: Secondary | ICD-10-CM | POA: Insufficient documentation

## 2023-12-17 DIAGNOSIS — K7030 Alcoholic cirrhosis of liver without ascites: Secondary | ICD-10-CM | POA: Insufficient documentation

## 2023-12-17 DIAGNOSIS — I82511 Chronic embolism and thrombosis of right femoral vein: Secondary | ICD-10-CM | POA: Insufficient documentation

## 2023-12-17 DIAGNOSIS — Z7984 Long term (current) use of oral hypoglycemic drugs: Secondary | ICD-10-CM | POA: Insufficient documentation

## 2023-12-17 MED ORDER — HYDROCORTISONE 0.5 % EX OINT
1.0000 | TOPICAL_OINTMENT | Freq: Two times a day (BID) | CUTANEOUS | 1 refills | Status: AC
  Filled 2023-12-17 – 2024-06-06 (×11): qty 56, 28d supply, fill #0

## 2023-12-17 MED ORDER — ACCU-CHEK SOFTCLIX LANCETS MISC
6 refills | Status: AC
  Filled 2023-12-17 – 2024-01-13 (×2): qty 100, 33d supply, fill #0
  Filled 2024-02-12: qty 100, 33d supply, fill #1
  Filled 2024-06-06: qty 100, 33d supply, fill #2

## 2023-12-17 MED ORDER — ACCU-CHEK AVIVA PLUS W/DEVICE KIT
1.0000 | PACK | Freq: Every day | 0 refills | Status: AC
  Filled 2023-12-17: qty 1, 30d supply, fill #0

## 2023-12-17 MED ORDER — ACCU-CHEK AVIVA PLUS VI STRP
ORAL_STRIP | 12 refills | Status: AC
  Filled 2023-12-17: qty 100, 100d supply, fill #0
  Filled 2024-06-06: qty 100, 33d supply, fill #1

## 2023-12-17 MED ORDER — GABAPENTIN 400 MG PO CAPS
800.0000 mg | ORAL_CAPSULE | Freq: Three times a day (TID) | ORAL | 3 refills | Status: DC
  Filled 2023-12-17 (×2): qty 180, 30d supply, fill #0
  Filled 2024-01-14: qty 180, 30d supply, fill #1
  Filled 2024-02-13: qty 180, 30d supply, fill #2
  Filled 2024-03-08: qty 180, 30d supply, fill #3
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
## 2023-12-17 NOTE — Patient Instructions (Addendum)
 VISIT SUMMARY:  Today, you came in for a check-up to manage your leg ulcers and review your medications. We discussed your diabetes management, the sores on your leg and around your belly button, your neuropathy, and your alcohol  use.  YOUR PLAN:  -VENOUS STASIS DERMATITIS: Venous stasis dermatitis is a condition where poor blood flow causes skin changes and sores. We will refer you to a vascular specialist to check your blood flow and to a wound care clinic for your sores. You should also apply hydrocortisone  cream to the affected areas.  Please use the compression stocking and elevate your legs.  -PERIPHERAL NEUROPATHY: Peripheral neuropathy is nerve damage that causes pain and loss of sensation, often in the feet and legs. Continue taking gabapentin  800 mg three times daily, and we will reassess your condition at your next visit.  -TYPE 2 DIABETES MELLITUS: Type 2 diabetes mellitus is a condition where your body does not use insulin properly, leading to high blood sugar levels. Your blood sugar control is excellent with an A1c of 5.8. We will order a glucometer and testing supplies for you to monitor your blood sugar at home. Check your fasting blood sugar before your first meal, aiming for 80-120 mg/dL. If your fasting blood sugar is consistently below 80 mg/dL, we may need to adjust your glipizide .  -ALCOHOL  USE DISORDER: Alcohol  use disorder is a condition where you have difficulty controlling your drinking. It can affect your healing and circulation. We encourage you to stop drinking alcohol  and can refer you to counseling or rehabilitation services if needed.  -CIRRHOSIS: Cirrhosis is severe liver damage that can lead to liver failure. To prevent further damage and reduce the risk of liver cancer, it is crucial to stop drinking alcohol .  INSTRUCTIONS:  Please follow up with the vascular specialist and wound care clinic as soon as possible. Start using the triamcinolone  cream on the affected  areas. Begin monitoring your blood sugar at home with the glucometer we will order for you. If you need help quitting alcohol , let us  know so we can refer you to counseling or rehabilitation services. We will reassess your neuropathy management at your next visit.

## 2023-12-21 ENCOUNTER — Other Ambulatory Visit: Payer: Self-pay | Admitting: Physician Assistant

## 2023-12-21 DIAGNOSIS — M7989 Other specified soft tissue disorders: Secondary | ICD-10-CM

## 2023-12-23 ENCOUNTER — Other Ambulatory Visit: Payer: Self-pay

## 2023-12-25 ENCOUNTER — Other Ambulatory Visit: Payer: Self-pay

## 2023-12-27 ENCOUNTER — Other Ambulatory Visit: Payer: Self-pay

## 2024-01-04 ENCOUNTER — Encounter (HOSPITAL_COMMUNITY): Payer: Self-pay

## 2024-01-04 ENCOUNTER — Other Ambulatory Visit: Payer: Self-pay

## 2024-01-04 ENCOUNTER — Ambulatory Visit (HOSPITAL_COMMUNITY): Attending: Family Medicine

## 2024-01-14 ENCOUNTER — Other Ambulatory Visit: Payer: Self-pay

## 2024-01-15 ENCOUNTER — Other Ambulatory Visit: Payer: Self-pay

## 2024-01-18 ENCOUNTER — Other Ambulatory Visit (HOSPITAL_COMMUNITY): Payer: Self-pay

## 2024-01-18 ENCOUNTER — Encounter (HOSPITAL_BASED_OUTPATIENT_CLINIC_OR_DEPARTMENT_OTHER): Attending: Internal Medicine | Admitting: Internal Medicine

## 2024-01-18 ENCOUNTER — Other Ambulatory Visit: Payer: Self-pay

## 2024-01-18 DIAGNOSIS — B372 Candidiasis of skin and nail: Secondary | ICD-10-CM | POA: Diagnosis not present

## 2024-01-18 DIAGNOSIS — S83105A Unspecified dislocation of left knee, initial encounter: Secondary | ICD-10-CM | POA: Diagnosis present

## 2024-01-18 DIAGNOSIS — S31105A Unspecified open wound of abdominal wall, periumbilic region without penetration into peritoneal cavity, initial encounter: Secondary | ICD-10-CM | POA: Insufficient documentation

## 2024-01-18 DIAGNOSIS — L98492 Non-pressure chronic ulcer of skin of other sites with fat layer exposed: Secondary | ICD-10-CM | POA: Insufficient documentation

## 2024-01-18 DIAGNOSIS — X58XXXA Exposure to other specified factors, initial encounter: Secondary | ICD-10-CM | POA: Insufficient documentation

## 2024-01-18 MED ORDER — KETOCONAZOLE 2 % EX CREA
1.0000 | TOPICAL_CREAM | Freq: Two times a day (BID) | CUTANEOUS | 0 refills | Status: AC
  Filled 2024-01-18 (×2): qty 30, 15d supply, fill #0

## 2024-01-21 ENCOUNTER — Other Ambulatory Visit: Payer: Self-pay | Admitting: Licensed Clinical Social Worker

## 2024-01-22 ENCOUNTER — Encounter: Payer: Self-pay | Admitting: Family Medicine

## 2024-01-22 ENCOUNTER — Ambulatory Visit (HOSPITAL_COMMUNITY)
Admission: RE | Admit: 2024-01-22 | Discharge: 2024-01-22 | Disposition: A | Discharge status: Home / Self Care | Source: Ambulatory Visit | Attending: Physician Assistant | Admitting: Physician Assistant

## 2024-01-22 DIAGNOSIS — M7989 Other specified soft tissue disorders: Secondary | ICD-10-CM

## 2024-01-23 NOTE — Telephone Encounter (Signed)
 FYI patient has upcoming appointment on 01/29/2024

## 2024-01-28 ENCOUNTER — Telehealth: Payer: Self-pay | Admitting: Family Medicine

## 2024-01-28 NOTE — Telephone Encounter (Signed)
 Contacted pt left vm to confirmed appt

## 2024-01-29 ENCOUNTER — Ambulatory Visit: Attending: Family Medicine | Admitting: Family Medicine

## 2024-01-29 ENCOUNTER — Encounter: Payer: Self-pay | Admitting: Family Medicine

## 2024-01-29 VITALS — BP 128/76 | HR 72 | Ht 74.0 in | Wt 267.4 lb

## 2024-01-29 DIAGNOSIS — N62 Hypertrophy of breast: Secondary | ICD-10-CM | POA: Insufficient documentation

## 2024-01-29 DIAGNOSIS — R21 Rash and other nonspecific skin eruption: Secondary | ICD-10-CM | POA: Diagnosis present

## 2024-01-29 DIAGNOSIS — N6342 Unspecified lump in left breast, subareolar: Secondary | ICD-10-CM | POA: Insufficient documentation
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
## 2024-01-29 NOTE — Patient Instructions (Signed)
 VISIT SUMMARY:  Today, you came in with concerns about a lump in your breast. We also discussed your healing back rash, a chronic abdominal ulcer, and venous insufficiency. You are not currently taking any medications or supplements.  YOUR PLAN:  -BREAST LUMP: A lump in the breast can be caused by various conditions, and it is important to evaluate it further. We will order a breast ultrasound to get a clearer picture. If the results are concerning, we will refer you to a breast surgeon. If the results are not concerning, we will continue to monitor the lump.  -ULCER OF ABDOMINAL WALL: An abdominal wall ulcer is a sore that has not healed properly. Continue with your current wound care management to help it heal.  -VENOUS INSUFFICIENCY: Venous insufficiency is a condition where the veins have trouble sending blood from the legs back to the heart. You need to attend your appointment with the vascular specialist on Thursday for further evaluation.  INSTRUCTIONS:  Please attend your appointment with the vascular specialist on Thursday. We will send the results of your breast ultrasound via MyChart. If you have any questions or concerns, feel free to reach out to our office.

## 2024-01-31 ENCOUNTER — Ambulatory Visit

## 2024-02-04 ENCOUNTER — Encounter (HOSPITAL_BASED_OUTPATIENT_CLINIC_OR_DEPARTMENT_OTHER): Admitting: Internal Medicine

## 2024-02-04 ENCOUNTER — Encounter (HOSPITAL_BASED_OUTPATIENT_CLINIC_OR_DEPARTMENT_OTHER): Attending: Internal Medicine | Admitting: Internal Medicine

## 2024-02-04 DIAGNOSIS — B372 Candidiasis of skin and nail: Secondary | ICD-10-CM | POA: Insufficient documentation

## 2024-02-04 DIAGNOSIS — S31105A Unspecified open wound of abdominal wall, periumbilic region without penetration into peritoneal cavity, initial encounter: Secondary | ICD-10-CM | POA: Diagnosis present

## 2024-02-04 DIAGNOSIS — L98492 Non-pressure chronic ulcer of skin of other sites with fat layer exposed: Secondary | ICD-10-CM | POA: Diagnosis not present

## 2024-02-04 DIAGNOSIS — X58XXXA Exposure to other specified factors, initial encounter: Secondary | ICD-10-CM | POA: Diagnosis not present

## 2024-02-05 ENCOUNTER — Other Ambulatory Visit

## 2024-02-05 ENCOUNTER — Ambulatory Visit: Payer: Self-pay | Admitting: Family Medicine

## 2024-02-05 ENCOUNTER — Ambulatory Visit
Admission: RE | Admit: 2024-02-05 | Discharge: 2024-02-05 | Disposition: A | Discharge status: Home / Self Care | Source: Ambulatory Visit | Attending: Family Medicine | Admitting: Family Medicine

## 2024-02-05 DIAGNOSIS — N62 Hypertrophy of breast: Secondary | ICD-10-CM

## 2024-02-05 DIAGNOSIS — N6342 Unspecified lump in left breast, subareolar: Secondary | ICD-10-CM

## 2024-02-06 ENCOUNTER — Ambulatory Visit: Attending: Vascular Surgery | Admitting: Physician Assistant

## 2024-02-06 VITALS — BP 109/73 | HR 83 | Temp 97.7°F | Ht 74.0 in | Wt 264.3 lb

## 2024-02-06 DIAGNOSIS — Z86718 Personal history of other venous thrombosis and embolism: Secondary | ICD-10-CM | POA: Insufficient documentation

## 2024-02-06 DIAGNOSIS — I872 Venous insufficiency (chronic) (peripheral): Secondary | ICD-10-CM | POA: Diagnosis present
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
## 2024-02-07 ENCOUNTER — Encounter: Payer: Self-pay | Admitting: Physician Assistant

## 2024-02-08 ENCOUNTER — Telehealth: Payer: Self-pay | Admitting: Family Medicine

## 2024-02-08 NOTE — Telephone Encounter (Signed)
 Contacted pt confirmed with wife he'll be there confirmed appt

## 2024-02-10 ENCOUNTER — Other Ambulatory Visit: Payer: Self-pay | Admitting: Critical Care Medicine

## 2024-02-11 ENCOUNTER — Encounter: Payer: Self-pay | Admitting: Family Medicine

## 2024-02-11 ENCOUNTER — Other Ambulatory Visit: Payer: Self-pay

## 2024-02-11 ENCOUNTER — Ambulatory Visit: Attending: Family Medicine | Admitting: Family Medicine

## 2024-02-11 VITALS — BP 143/85 | HR 95 | Ht 74.0 in | Wt 262.2 lb

## 2024-02-11 DIAGNOSIS — K7030 Alcoholic cirrhosis of liver without ascites: Secondary | ICD-10-CM | POA: Insufficient documentation

## 2024-02-11 DIAGNOSIS — Z5986 Financial insecurity: Secondary | ICD-10-CM | POA: Insufficient documentation

## 2024-02-11 DIAGNOSIS — E119 Type 2 diabetes mellitus without complications: Secondary | ICD-10-CM | POA: Diagnosis present

## 2024-02-11 DIAGNOSIS — E1169 Type 2 diabetes mellitus with other specified complication: Secondary | ICD-10-CM

## 2024-02-11 DIAGNOSIS — E785 Hyperlipidemia, unspecified: Secondary | ICD-10-CM | POA: Diagnosis not present

## 2024-02-11 DIAGNOSIS — Z79899 Other long term (current) drug therapy: Secondary | ICD-10-CM | POA: Insufficient documentation

## 2024-02-11 DIAGNOSIS — I872 Venous insufficiency (chronic) (peripheral): Secondary | ICD-10-CM | POA: Diagnosis not present

## 2024-02-11 DIAGNOSIS — E1159 Type 2 diabetes mellitus with other circulatory complications: Secondary | ICD-10-CM | POA: Diagnosis not present

## 2024-02-11 DIAGNOSIS — Z86718 Personal history of other venous thrombosis and embolism: Secondary | ICD-10-CM | POA: Insufficient documentation

## 2024-02-11 DIAGNOSIS — Z7901 Long term (current) use of anticoagulants: Secondary | ICD-10-CM | POA: Insufficient documentation

## 2024-02-11 DIAGNOSIS — I152 Hypertension secondary to endocrine disorders: Secondary | ICD-10-CM | POA: Diagnosis not present

## 2024-02-11 DIAGNOSIS — Z7984 Long term (current) use of oral hypoglycemic drugs: Secondary | ICD-10-CM | POA: Insufficient documentation

## 2024-02-11 DIAGNOSIS — E1165 Type 2 diabetes mellitus with hyperglycemia: Secondary | ICD-10-CM | POA: Diagnosis not present

## 2024-02-11 DIAGNOSIS — F1096 Alcohol use, unspecified with alcohol-induced persisting amnestic disorder: Secondary | ICD-10-CM | POA: Diagnosis not present

## 2024-02-11 DIAGNOSIS — G629 Polyneuropathy, unspecified: Secondary | ICD-10-CM

## 2024-02-11 DIAGNOSIS — F17210 Nicotine dependence, cigarettes, uncomplicated: Secondary | ICD-10-CM | POA: Diagnosis not present

## 2024-02-11 DIAGNOSIS — F1026 Alcohol dependence with alcohol-induced persisting amnestic disorder: Secondary | ICD-10-CM

## 2024-02-11 DIAGNOSIS — I10 Essential (primary) hypertension: Secondary | ICD-10-CM

## 2024-02-11 DIAGNOSIS — G621 Alcoholic polyneuropathy: Secondary | ICD-10-CM | POA: Insufficient documentation

## 2024-02-11 DIAGNOSIS — I82511 Chronic embolism and thrombosis of right femoral vein: Secondary | ICD-10-CM

## 2024-02-11 LAB — POCT GLYCOSYLATED HEMOGLOBIN (HGB A1C): HbA1c, POC (controlled diabetic range): 5.7 % (ref 0.0–7.0)

## 2024-02-11 MED ORDER — GLIPIZIDE 5 MG PO TABS
5.0000 mg | ORAL_TABLET | Freq: Two times a day (BID) | ORAL | 2 refills | Status: DC
  Filled 2024-02-11: qty 60, 30d supply, fill #0
  Filled 2024-05-11: qty 60, 30d supply, fill #1

## 2024-02-11 NOTE — Patient Instructions (Signed)
 VISIT SUMMARY:  Today, we discussed your non-healing leg wounds, diabetes management, history of DVT, and other health concerns. We reviewed your current medications and made some adjustments to your treatment plan. We also talked about lifestyle changes that could help improve your overall health.  YOUR PLAN:  -CHRONIC ABDOMINAL WOUND: Your chronic wound is not healing, likely due to repeated trauma and diabetes. Please attend your wound care specialist appointment next Monday for further evaluation.  -VENOUS STASIS DERMATITIS: This condition is causing skin changes due to poor blood flow, worsened by your work. Try to elevate your legs for 20-30 minutes daily and use compression stockings as much as you can tolerate.  -DEEP VEIN THROMBOSIS (DVT): You have a history of blood clots in your veins. Since there is no current evidence of DVT and you have had just 1 episode of DVT, you can stop taking rivaroxaban  (Xarelto ). We will do a blood test in two weeks to check your clotting risk and kidney function.  -DIABETES MELLITUS: Your diabetes is well-controlled with your current medication. Continue taking glipizide  and monitor your blood glucose levels regularly.  -PERIPHERAL NEUROPATHY: This condition causes reduced sensation in your feet. Continue taking gabapentin  and make sure to wear shoes or flip flops at all times to prevent injury.  -HYPERTENSION: Your blood pressure was slightly elevated today, likely due to smoking. We will recheck it after you rest. Please work on weight loss, exercise, and follow a low sodium diet.  -TOBACCO USE DISORDER: You have a long history of smoking. We will order a chest CT to screen for lung cancer and discuss options to help you quit smoking and manage stress.  -GENERAL HEALTH MAINTENANCE: You should use sunscreen to prevent skin cancer and schedule a routine eye exam.  INSTRUCTIONS:  Please get blood tests in two weeks to assess clotting risk and kidney  function. Follow up with your primary care doctor in six months unless any issues arise.
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
## 2024-02-12 ENCOUNTER — Other Ambulatory Visit

## 2024-02-12 ENCOUNTER — Encounter

## 2024-02-13 ENCOUNTER — Other Ambulatory Visit: Payer: Self-pay

## 2024-02-18 ENCOUNTER — Encounter (HOSPITAL_BASED_OUTPATIENT_CLINIC_OR_DEPARTMENT_OTHER): Admitting: Internal Medicine

## 2024-02-20 ENCOUNTER — Other Ambulatory Visit

## 2024-02-28 ENCOUNTER — Telehealth: Payer: Self-pay | Admitting: *Deleted

## 2024-02-28 NOTE — Progress Notes (Signed)
 Complex Care Management Care Guide Note  02/28/2024 Name: Dylan Snyder MRN: [id number] DOB: 1971/02/24  Dylan Snyder is a 53 y.o. year old male who is a primary care patient of Brien Belvie BRAVO, MD and is actively engaged with the care management team. I reached out to Adriana JONETTA Philips by phone today to assist with re-scheduling  with the Licensed Clinical Child psychotherapist.  Follow up plan: Unsuccessful telephone outreach attempt made. A HIPAA compliant phone message was left for the patient providing contact information and requesting a return call.  Harlene Satterfield  [facility]  [facility], [facility] Guide  Direct Dial: [phone]  Fax [phone]

## 2024-02-28 NOTE — Progress Notes (Signed)
 Complex Care Management Care Guide Note  02/28/2024 Name: [name] MRN: [id number] DOB: [birth year]  Dylan Snyder is a 53 y.o. year old male who is a primary care patient of Brien Belvie BRAVO, MD and is actively engaged with the care management team. I reached out to Adriana JONETTA Philips by phone today to assist with re-scheduling  with the Licensed Clinical Child psychotherapist.  Follow up plan: Telephone appointment with complex care management team member scheduled for:  03/10/24  Harlene Satterfield  [facility]  Value[facility] Care Institute, [facility] Guide  Direct Dial: [phone]  Fax [phone]

## 2024-03-10 ENCOUNTER — Other Ambulatory Visit: Payer: Self-pay

## 2024-03-10 ENCOUNTER — Other Ambulatory Visit: Payer: Self-pay | Admitting: Licensed Clinical Social Worker

## 2024-03-10 NOTE — Patient Instructions (Signed)
 Visit Information  Thank you for taking time to visit with me today. Please don't hesitate to contact me if I can be of assistance to you before our next scheduled appointment.  Client and spouse of client have LCSW name and phone number and have been encouraged to call LCSW as needed for SW support.   Please call the care guide team at [phone] if you need to cancel or reschedule your appointment.   Following is a copy of your care plan:   Goals Addressed             This Visit's Progress    VBCI Social Work Care Plan       Problems:   Cognitive Deficits              Memory issues             Daily alcohol  use             Decreased appetite             Sleep challenges             Medical side affects of continued alcohol  use               CSW Clinical Goal(s):   Over the next 30 days   the Patient will consider some options for alcohol  use treatment for client AEB client participation in AA, NA meeting, discussion with PCP about alcohol  use, or engagement in rehab program .              Over next 30 days, the patient will attend scheduled medical appointments AEB patient report and as documented in EPIC record   Interventions:  Spoke with Dylan Snyder, spouse of client, about client needs and status              Dylan Snyder reported that client is having short term memory issues, has difficulty remembering items or events              Discussed client discussion with PCP about client use of alcohol .  Dylan Snyder said client seems to have no desire to stop alcohol  use.              Discussed medication procurement for client. Client has reduced appetite and sleeping challenges           Client only drives short distances. 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             Encouraged client or Dylan Snyder to call LCSW as needed for SW support for client at  [phone]           Discussed Medicaid eligibility of client. Dylan Snyder, spouse of client, is concerned that client may lose Medicaid at end of August 2025.  She said she works full time and is not able to add client to her insurance.  LCSW called PCP office today and asked for RN to return call to LCSW to discuss Medicaid eligibility of client and to discuss spouse's concerns over insurance discontinuation for client             Discussed Guardianship as a possible avenue for client care. Reviewed with Dylan Snyder how Guardianship could be set up for client possibly.  Discussed petition for Guardianship through local [employer].  Dylan Snyder discussed marital stress issues and is concerned that client is not trying to implement suggestions to help him provided by medical providers of client. She said he is not wearing compression stockings; she said he is not consistent in taking prescribed medications               Dylan Snyder said client has some relatives who do not live in the local area.  She is providing much of the care for client at present             LCSW encouraged Dylan Snyder or client to call LCSW as needed for SW support at [phone].  Patient Goals/Self-Care Activities:  Client to attend scheduled medical appointments              Client to take medications as prescribed            Client to consider alcohol  treatment support services:  AA, NA, rehab program support            Client to allow time for rest and relaxation activities            Client to try to stay away from areas where others are drinking alcohol   Plan:   LCSW has provided client and Dylan Snyder with LCSW name and phone number and invited client or spouse to call LCSW as needed for SW support for client.           Please go to [facility] [address] [place] [phone]) if you are experiencing a Mental Health or Behavioral Health Crisis or need  someone to talk to.  The patient/ Dylan Snyder, spouse of patient,  verbalized understanding of instructions, educational materials, and care plan provided today and DECLINED offer to receive copy of patient instructions, educational materials, and care plan.    Dylan Snyder  MSW, LCSW [place] Licensed Clinical Social Worker Direct Dial:  [phone] Fax:  [phone] Website:  [email or web address]
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
## 2024-03-11 ENCOUNTER — Telehealth: Payer: Self-pay

## 2024-03-11 NOTE — Telephone Encounter (Signed)
 Call to [facility] , LCSW unable to reach VM left.

## 2024-04-07 ENCOUNTER — Other Ambulatory Visit: Payer: Self-pay

## 2024-04-07 ENCOUNTER — Telehealth: Payer: Self-pay | Admitting: Family Medicine

## 2024-04-07 ENCOUNTER — Other Ambulatory Visit: Payer: Self-pay | Admitting: Family Medicine

## 2024-04-07 MED ORDER — GABAPENTIN 400 MG PO CAPS
800.0000 mg | ORAL_CAPSULE | Freq: Three times a day (TID) | ORAL | 3 refills | Status: DC
  Filled 2024-04-07: qty 180, 30d supply, fill #0
  Filled 2024-05-11: qty 180, 30d supply, fill #1
  Filled 2024-06-06: qty 180, 30d supply, fill #2
  Filled 2024-07-16: qty 180, 30d supply, fill #3

## 2024-04-07 NOTE — Telephone Encounter (Signed)
 Pt unconfirmed appt 9/9

## 2024-04-08 ENCOUNTER — Other Ambulatory Visit: Payer: Self-pay

## 2024-04-08 ENCOUNTER — Encounter: Payer: Self-pay | Admitting: Family Medicine

## 2024-04-08 ENCOUNTER — Ambulatory Visit: Payer: Self-pay | Attending: Family Medicine | Admitting: Family Medicine

## 2024-04-08 VITALS — BP 127/75 | HR 80 | Temp 98.5°F | Resp 14 | Ht 74.0 in | Wt 264.8 lb

## 2024-04-08 DIAGNOSIS — Z0001 Encounter for general adult medical examination with abnormal findings: Secondary | ICD-10-CM

## 2024-04-08 DIAGNOSIS — F17210 Nicotine dependence, cigarettes, uncomplicated: Secondary | ICD-10-CM

## 2024-04-08 DIAGNOSIS — Z1159 Encounter for screening for other viral diseases: Secondary | ICD-10-CM

## 2024-04-08 DIAGNOSIS — Z23 Encounter for immunization: Secondary | ICD-10-CM

## 2024-04-08 DIAGNOSIS — I872 Venous insufficiency (chronic) (peripheral): Secondary | ICD-10-CM

## 2024-04-08 DIAGNOSIS — Z125 Encounter for screening for malignant neoplasm of prostate: Secondary | ICD-10-CM

## 2024-04-08 MED ORDER — BUPROPION HCL ER (XL) 150 MG PO TB24
150.0000 mg | ORAL_TABLET | Freq: Every day | ORAL | 1 refills | Status: AC
  Filled 2024-04-08: qty 90, 90d supply, fill #0

## 2024-04-08 NOTE — Patient Instructions (Signed)
 VISIT SUMMARY:  Today, you had your annual physical exam. We discussed several important health issues, including your diabetes, chronic leg wound, and smoking habits. We also reviewed the importance of vaccinations and routine screenings.  YOUR PLAN:  -ADULT WELLNESS VISIT: We conducted your annual physical exam and discussed the importance of flu and pneumonia vaccines, especially because of your diabetes. We also recommended a prostate cancer screening since you are over 50. You received the flu and pneumonia vaccines today, and we ordered a blood test for prostate cancer screening.  -TYPE 2 DIABETES MELLITUS WITH HYPERGLYCEMIA: Type 2 diabetes is a condition where your body does not use insulin properly, leading to high blood sugar levels. We emphasized the importance of annual eye exams to monitor for diabetes-related eye issues. We have ordered an eye exam for you.  -CHRONIC VENOUS INSUFFICIENCY WITH ULCER AND DERMATITIS, RIGHT LOWER EXTREMITY: Chronic venous insufficiency is a condition where the veins in your legs do not allow blood to flow back to your heart properly, leading to ulcers and skin changes. We discussed the potential for venous ablation treatment and emphasized the importance of wearing compression stockings and elevating your leg to help with healing.  -NICOTINE  DEPENDENCE, CIGARETTES: Nicotine  dependence is an addiction to tobacco products. You have a long history of smoking and have tried to quit before. We discussed using Wellbutrin  as an alternative to help reduce your cravings and aid in smoking cessation. A prescription for Wellbutrin  has been provided.  INSTRUCTIONS:  Please schedule your annual eye exam as soon as possible. Follow up with the blood test for prostate cancer screening. Make sure to wear your compression stockings regularly and elevate your leg to help with the healing of your leg wound. Start taking Wellbutrin  as prescribed to help with smoking  cessation.
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
## 2024-05-05 ENCOUNTER — Encounter: Payer: Self-pay | Admitting: Surgery

## 2024-05-05 ENCOUNTER — Ambulatory Visit: Payer: Self-pay | Attending: Surgery | Admitting: Surgery

## 2024-05-05 VITALS — BP 169/100 | HR 80 | Temp 98.0°F | Ht 74.0 in | Wt 260.0 lb

## 2024-05-05 DIAGNOSIS — I83891 Varicose veins of right lower extremities with other complications: Secondary | ICD-10-CM
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
## 2024-05-12 ENCOUNTER — Other Ambulatory Visit: Payer: Self-pay

## 2024-06-03 ENCOUNTER — Encounter: Payer: Self-pay | Admitting: *Deleted

## 2024-06-06 ENCOUNTER — Other Ambulatory Visit: Payer: Self-pay

## 2024-06-10 ENCOUNTER — Other Ambulatory Visit: Payer: Self-pay

## 2024-07-17 ENCOUNTER — Other Ambulatory Visit: Payer: Self-pay

## 2024-08-13 ENCOUNTER — Encounter: Payer: Self-pay | Admitting: Family Medicine

## 2024-08-13 ENCOUNTER — Other Ambulatory Visit: Payer: Self-pay

## 2024-08-13 ENCOUNTER — Ambulatory Visit: Payer: Self-pay | Attending: Family Medicine | Admitting: Family Medicine

## 2024-08-13 VITALS — BP 153/77 | HR 70 | Temp 98.0°F | Ht 74.0 in | Wt 260.0 lb

## 2024-08-13 DIAGNOSIS — G629 Polyneuropathy, unspecified: Secondary | ICD-10-CM

## 2024-08-13 DIAGNOSIS — F1010 Alcohol abuse, uncomplicated: Secondary | ICD-10-CM

## 2024-08-13 DIAGNOSIS — E1169 Type 2 diabetes mellitus with other specified complication: Secondary | ICD-10-CM

## 2024-08-13 DIAGNOSIS — I872 Venous insufficiency (chronic) (peripheral): Secondary | ICD-10-CM

## 2024-08-13 DIAGNOSIS — I10 Essential (primary) hypertension: Secondary | ICD-10-CM

## 2024-08-13 DIAGNOSIS — F1026 Alcohol dependence with alcohol-induced persisting amnestic disorder: Secondary | ICD-10-CM

## 2024-08-13 DIAGNOSIS — E1159 Type 2 diabetes mellitus with other circulatory complications: Secondary | ICD-10-CM

## 2024-08-13 DIAGNOSIS — E785 Hyperlipidemia, unspecified: Secondary | ICD-10-CM

## 2024-08-13 DIAGNOSIS — Z7984 Long term (current) use of oral hypoglycemic drugs: Secondary | ICD-10-CM

## 2024-08-13 DIAGNOSIS — F1096 Alcohol use, unspecified with alcohol-induced persisting amnestic disorder: Secondary | ICD-10-CM

## 2024-08-13 DIAGNOSIS — E1165 Type 2 diabetes mellitus with hyperglycemia: Secondary | ICD-10-CM

## 2024-08-13 LAB — POCT GLYCOSYLATED HEMOGLOBIN (HGB A1C): HbA1c, POC (controlled diabetic range): 5.9 % (ref 0.0–7.0)

## 2024-08-13 MED ORDER — GLIPIZIDE 5 MG PO TABS
5.0000 mg | ORAL_TABLET | Freq: Two times a day (BID) | ORAL | 1 refills | Status: AC
  Filled 2024-08-13: qty 180, 90d supply, fill #0

## 2024-08-13 MED ORDER — GABAPENTIN 400 MG PO CAPS
800.0000 mg | ORAL_CAPSULE | Freq: Three times a day (TID) | ORAL | 3 refills | Status: AC
  Filled 2024-08-13 – 2024-08-15 (×2): qty 180, 30d supply, fill #0

## 2024-08-13 MED ORDER — METOPROLOL TARTRATE 100 MG PO TABS
100.0000 mg | ORAL_TABLET | Freq: Two times a day (BID) | ORAL | 1 refills | Status: AC
  Filled 2024-08-13: qty 180, 90d supply, fill #0

## 2024-08-13 MED ORDER — PANTOPRAZOLE SODIUM 40 MG PO TBEC
40.0000 mg | DELAYED_RELEASE_TABLET | Freq: Every day | ORAL | 1 refills | Status: AC
  Filled 2024-08-13: qty 90, 90d supply, fill #0

## 2024-08-13 MED ORDER — CLONIDINE HCL 0.1 MG PO TABS
0.1000 mg | ORAL_TABLET | Freq: Two times a day (BID) | ORAL | 2 refills | Status: AC
  Filled 2024-08-13: qty 180, 90d supply, fill #0

## 2024-08-13 MED ORDER — FOLIC ACID 1 MG PO TABS
1.0000 mg | ORAL_TABLET | Freq: Every day | ORAL | 1 refills | Status: AC
  Filled 2024-08-13: qty 90, 90d supply, fill #0
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
## 2024-08-13 NOTE — Patient Instructions (Signed)
 Good afternoon, Dylan Snyder,   Dr. Serene gave me your contact information regarding a possible venous procedure on your right leg which he recommended at your office visit with him in October.  I wanted to verify if you have medical insurance. I have no record of health insurance in the electronic medical record.  If you are self-pay (uninsured), I can provide the discounted cost of the procedure. Feel free to call me directly at [phone] Tuesday, Wednesday, or Thursday afternoons to get this information.     Best Birdena Grayce Ruder RN  [phone]

## 2024-08-15 ENCOUNTER — Other Ambulatory Visit: Payer: Self-pay

## 2024-08-15 ENCOUNTER — Encounter: Payer: Self-pay | Admitting: Family Medicine

## 2024-08-17 LAB — CMP14+EGFR
ALT: 19 IU/L (ref 0–44)
AST: 27 IU/L (ref 0–40)
Albumin: 4.8 g/dL (ref 3.8–4.9)
Alkaline Phosphatase: 78 IU/L (ref 47–123)
BUN/Creatinine Ratio: 12 (ref 9–20)
BUN: 11 mg/dL (ref 6–24)
Bilirubin Total: 1 mg/dL (ref 0.0–1.2)
CO2: 20 mmol/L (ref 20–29)
Calcium: 10.1 mg/dL (ref 8.7–10.2)
Chloride: 98 mmol/L (ref 96–106)
Creatinine, Ser: 0.92 mg/dL (ref 0.76–1.27)
Globulin, Total: 3.2 g/dL (ref 1.5–4.5)
Glucose: 91 mg/dL (ref 70–99)
Potassium: 4.7 mmol/L (ref 3.5–5.2)
Sodium: 135 mmol/L (ref 134–144)
Total Protein: 8 g/dL (ref 6.0–8.5)
eGFR: 100 mL/min/1.73

## 2024-08-17 LAB — VITAMIN B1: Thiamine: 281.4 nmol/L — ABNORMAL HIGH (ref 66.5–200.0)

## 2024-08-17 LAB — MICROALBUMIN / CREATININE URINE RATIO
Creatinine, Urine: 20.6 mg/dL
Microalb/Creat Ratio: <15 mg/g{creat} (ref 0–29)
Microalbumin, Urine: <3 ug/mL

## 2024-08-18 ENCOUNTER — Ambulatory Visit: Payer: Self-pay | Admitting: Family Medicine
